# Patient Record
Sex: Male | Born: 1990 | Marital: Single | Smoking: Former smoker
Health system: Southern US, Community
[De-identification: ages and names within clinical notes are randomized; demographics above are authoritative.]

## PROBLEM LIST (undated history)

## (undated) DIAGNOSIS — M419 Scoliosis, unspecified: Secondary | ICD-10-CM

## (undated) HISTORY — DX: Scoliosis, unspecified: M41.9

---

## 2012-03-15 ENCOUNTER — Ambulatory Visit (INDEPENDENT_AMBULATORY_CARE_PROVIDER_SITE_OTHER): Payer: BC Managed Care – PPO | Admitting: Family Medicine

## 2012-03-15 VITALS — BP 111/70 | HR 60 | Temp 97.5°F | Resp 18 | Ht 68.0 in | Wt 158.0 lb

## 2012-03-15 DIAGNOSIS — Z113 Encounter for screening for infections with a predominantly sexual mode of transmission: Secondary | ICD-10-CM

## 2012-03-15 LAB — HIV ANTIBODY (ROUTINE TESTING W REFLEX): HIV: NONREACTIVE

## 2012-03-15 LAB — HEPATITIS C ANTIBODY: HCV Ab: NEGATIVE

## 2012-03-15 NOTE — Progress Notes (Signed)
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 He's here for STD screening. He spoke become sexually active and wants to make sure he didn't have any problems.  He has a history of HPV.  Objective: Patient has multiple verrucous papules at the base of his penis on the right. No other abnormalities are noted.  Assessment: Need for STD screening. I discussed with the patient the fact that he does have HPV and this is contagious. I offered him a referral to Dr. Jeannett Senior DOS the and noted on a prescription pad for. Plan:  Plan: Hepatitis C titer, HIV, RPR, UA probe. Urged to use condoms

## 2012-03-16 LAB — RPR

## 2012-03-18 LAB — GC/CHLAMYDIA PROBE AMP, URINE
Chlamydia, Swab/Urine, PCR: NEGATIVE
GC Probe Amp, Urine: NEGATIVE

## 2013-05-27 ENCOUNTER — Ambulatory Visit (INDEPENDENT_AMBULATORY_CARE_PROVIDER_SITE_OTHER): Payer: BC Managed Care – PPO | Admitting: Medical

## 2013-05-27 ENCOUNTER — Ambulatory Visit: Payer: BC Managed Care – PPO | Admitting: Medical

## 2013-05-27 ENCOUNTER — Encounter: Payer: Self-pay | Admitting: Medical

## 2013-05-27 VITALS — BP 130/80 | HR 72 | Temp 97.9°F | Resp 16 | Wt 204.0 lb

## 2013-05-27 DIAGNOSIS — R51 Headache: Secondary | ICD-10-CM

## 2013-05-27 DIAGNOSIS — B349 Viral infection, unspecified: Secondary | ICD-10-CM

## 2013-05-27 DIAGNOSIS — R110 Nausea: Secondary | ICD-10-CM

## 2013-05-27 DIAGNOSIS — Z23 Encounter for immunization: Secondary | ICD-10-CM

## 2013-05-27 DIAGNOSIS — B9789 Other viral agents as the cause of diseases classified elsewhere: Secondary | ICD-10-CM

## 2013-05-27 NOTE — Progress Notes (Signed)
Subjective:  Jackson Terrell is a 22 y.o. male who presents as a new patient today.   Here for 24 hour hx/o scratchy throat, nausea, feeling warm but no fever. Denies fever, vomiting, cough, diarrhea, ear pain, nasal congestion, SOB, abdominal pain.  Coworker was out earlier this week with 24 hour stomach bug with same symptoms.   No other aggravating or relieving factors.  No other c/o.  The following portions of the patient's history were reviewed and updated as appropriate: allergies, current medications, past family history, past medical history, past social history, past surgical history and problem list.  ROS Otherwise as in subjective above  Objective: Physical Exam  BP 130/80  Pulse 72  Temp(Src) 97.9 F (36.6 C) (Oral)  Resp 16  Wt 204 lb (92.534 kg)   General appearance: alert, no distress, WD/WN HEENT: normocephalic, sclerae anicteric, conjunctiva pink and moist, TMs pearly, nares patent, no discharge or erythema, pharynx with slight erythema Oral cavity: MMM, no lesions Neck: supple, no lymphadenopathy, no thyromegaly, no masses Heart: RRR, normal S1, S2, no murmurs Lungs: CTA bilaterally, no wheezes, rhonchi, or rales Abdomen: +bs, soft, non tender, non distended, no masses, no hepatomegaly, no splenomegaly Pulses: 2+ radial pulses, 2+ pedal pulses, normal cap refill Neuro: nonfocal Ext: no edema    Assessment: Encounter Diagnoses  Name Primary?  . Nausea alone Yes  . Headache(784.0)   . Viral syndrome   . Need for prophylactic vaccination and inoculation against influenza     Plan: Viral gastroenteritis vs GERD.  Begin OTC Emetrol or Benadryl for nausea, discussed GERD trigger avoidance, rest, hydrate well, and if not improving in 2-3 days, then call back.  Counseled on the influenza virus vaccine.  Vaccine information sheet given.  Influenza vaccine given after consent obtained.  Follow up: prn, consider physical

## 2013-05-27 NOTE — Patient Instructions (Signed)
Symptoms currently suggest either viral syndrome, stomach bug, or possibly even acid reflux.   Recommendations: For the next few days use a bland diet, clear liquids, rest, wash hands frequently.  You can use benadryl or Emetrol for nausea OTC.   If you need something stronger, then call back.  Call or return if fever, call if several episodes of diarrhea, call if uncontrollable vomiting.

## 2013-07-29 ENCOUNTER — Ambulatory Visit (INDEPENDENT_AMBULATORY_CARE_PROVIDER_SITE_OTHER): Payer: BC Managed Care – PPO | Admitting: Family Medicine

## 2013-07-29 ENCOUNTER — Ambulatory Visit: Payer: BC Managed Care – PPO | Admitting: Family Medicine

## 2013-07-29 ENCOUNTER — Encounter: Payer: Self-pay | Admitting: Family Medicine

## 2013-07-29 VITALS — BP 128/80 | HR 68 | Temp 98.1°F | Ht 69.0 in | Wt 209.0 lb

## 2013-07-29 DIAGNOSIS — J029 Acute pharyngitis, unspecified: Secondary | ICD-10-CM

## 2013-07-29 NOTE — Patient Instructions (Signed)
Use tylenol or ibuprofen as needed for pain.  Salt water gargles will help. If you develop more cold symptoms (nasal congestion, cough, etc), then use decongestant as needed, expectorant as needed (ie we discussed mucinex and sudafed, vs mucinex D)--I don't think you need these now.  Consider doing some core and back strengthening exercises (such as yoga, pilates) to help prevent back pain.  Schedule a complete physical at your convenience

## 2013-07-29 NOTE — Progress Notes (Signed)
Chief Complaint  Patient presents with  . sore throat    sore throat for the last 2 days. it was hurting when he woke up but then after he got out of shower it doesnt hurt as bad. he can just notice that he has one. pt is also clear throat.   1-2 days ago he started with scratchy throat.  It was worse yesterday, and this morning his glands felt swollen.  Feeling better now, still a little sore, but not nearly as bad as when he woke up this morning.  Denies runny nose, sniffles, sneezing, fevers, ear pain, sinus pain.  He has occasional cough.  Denies body aches.  He got a flu shot this year  Past Medical History  Diagnosis Date  . Scoliosis     mild   History reviewed. No pertinent past surgical history. History   Social History  . Marital Status: Single    Spouse Name: N/A    Number of Children: N/A  . Years of Education: N/A   Occupational History  . accounts payable (Therapist, sportsproperty management)    Social History Main Topics  . Smoking status: Former Smoker    Types: Cigarettes    Quit date: 07/17/2007  . Smokeless tobacco: Never Used  . Alcohol Use: Yes     Comment: 3 beers/week or less  . Drug Use: No  . Sexual Activity: Not on file   Other Topics Concern  . Not on file   Social History Narrative   Lives alone, no pets   Family History  Problem Relation Age of Onset  . Hemachromatosis Father   . Cancer Neg Hx   . Diabetes Neg Hx    No current outpatient prescriptions on file.  No Known Allergies  ROS: denies fevers, chest pain, headaches, dizziness, rashes, bleeding, bruising, nausea, vomiting, diarrhea, urinary complaints or other concerns. Occasional low back pain.  He has sedentary job; back felt better when working in [employer] and on his feet more.  PHYSICAL EXAM: BP 128/80  Pulse 68  Temp(Src) 98.1 F (36.7 C) (Oral)  Ht 5\' 9"  (1.753 m)  Wt 209 lb (94.802 kg)  BMI 30.85 kg/m2 Well developed, pleasant male in no distress HEENT: PERRL, EOMI, conjunctiva  clear. TM's and EAC's normal.  Nasal mucosa is mildly edematous with clear mucus on the left, and some crusting on the right.  Sinuses are nontender.  OP is clear--very mild erythema of anterior tonsillar pillars, and posteriorly, but tonsils are normal Neck: no lymphadenopathy or mass Heart: regular rate and rhythm without murmur Lungs: clear bilaterally Skin: no rash Neuro: alert and oriented Psych: normal mood, affect  ASSESSMENT/PLAN:  Acute pharyngitis  Pharyngitis--likely related to some postnasal drainage, worsened by mouth breathing at night from nasal congestion.  Could be a mild virus/cold.  Supportive measures reviewed.  F/u prn, physical recommended.  Intermittent LBP, possibly related to mild scoliosis.  Recommended back and core strengthening exercises (ie yoga, pilates).  Schedule CPE

## 2013-08-19 ENCOUNTER — Encounter: Payer: Self-pay | Admitting: Medical

## 2013-08-19 ENCOUNTER — Ambulatory Visit (INDEPENDENT_AMBULATORY_CARE_PROVIDER_SITE_OTHER): Payer: BC Managed Care – PPO | Admitting: Medical

## 2013-08-19 VITALS — BP 100/60 | HR 92 | Temp 98.6°F | Resp 16 | Wt 205.0 lb

## 2013-08-19 DIAGNOSIS — R110 Nausea: Secondary | ICD-10-CM

## 2013-08-19 DIAGNOSIS — R197 Diarrhea, unspecified: Secondary | ICD-10-CM

## 2013-08-19 DIAGNOSIS — A084 Viral intestinal infection, unspecified: Secondary | ICD-10-CM

## 2013-08-19 DIAGNOSIS — A088 Other specified intestinal infections: Secondary | ICD-10-CM

## 2013-08-19 NOTE — Progress Notes (Signed)
  Subjective:  Jackson Terrell is a 23 y.o. male who presents for illness.  Started Monday at 12:30am with nausea, numerous episodes of diarrhea, dry heaves, not feeling well.  All day Tuesday had several episodes of loose stool, nausea, ongoing belly upset. So far today only a few episodes of loose stool, some nausea, and just doesn't feel good.  Denies fever, vomiting, blood in stool, mucus in stool, no GU symptoms, no URI symptoms. No recent travel, no recent antibiotics, no recent camping, no animal contacts. One sick contact had a stomach bug several days ago. No other aggravating or relieving factors. No other ccomplaint The following portions of the patient's history were reviewed and updated as appropriate: allergies, current medications, past family history, past medical history, past social history, past surgical history and problem list.  ROS Otherwise as in subjective above  Objective: Physical Exam BP 100/60  Pulse 92  Temp(Src) 98.6 F (37 C) (Oral)  Resp 16  Wt 205 lb (92.987 kg)  General appearance: alert, no distress, WD/WN HEENT: normocephalic, sclerae anicteric, conjunctiva pink and moist, TMs pearly, nares patent, no discharge or erythema, pharynx normal Oral cavity: MMM, no lesions Neck: supple, no lymphadenopathy, no thyromegaly, no masses Heart: RRR, normal S1, S2, no murmurs Lungs: CTA bilaterally, no wheezes, rhonchi, or rales Abdomen: +mildly increased bs, soft, non tender, non distended, no masses, no hepatomegaly, no splenomegaly Pulses: 2+ radial pulses, 2+ pedal pulses, normal cap refill Ext: no edema   Assessment: Encounter Diagnoses  Name Primary?  . Viral gastroenteritis Yes  . Nausea alone   . Diarrhea     Plan: Currently he doesn't look particularly toxic or sick appearing.  Doesn't look particularly dehydrated, vitals are normal.  Discussed diagnosis of viral gastroenteritis, supportive  care, can use Pepto-Bismol over-the-counter. Recommended good hydration, limit solid food intake for now until much better.  He declines prescription anti-emetics at this time.  Advise that the normal time frame for this to resolve would be the next few days.  If any new symptoms such as fever, blood in stool, worsening loose stool particularly over 6 or 7 times daily then return. Note given for work.  Follow up: Otherwise when necessary

## 2013-08-19 NOTE — Patient Instructions (Signed)
Viral Gastroenteritis Viral gastroenteritis is also known as stomach flu. This condition affects the stomach and intestinal tract. The illness typically lasts 3 to 8 days. Most people develop an immune response. This eventually gets rid of the virus. While this natural response develops, the virus can make you quite ill.  CAUSES  Diarrhea and vomiting are often caused by a virus. Medicines (antibiotics) that kill germs will not help unless there is also a germ (bacterial) infection. SYMPTOMS  The most common symptom is diarrhea. This can cause severe loss of fluids (dehydration) and body salt (electrolyte) imbalance. TREATMENT  Treatments for this illness are aimed at rehydration. Antidiarrheal medicines are not recommended. They do not decrease diarrhea volume and may be harmful. Usually, home treatment is all that is needed. The most serious cases involve vomiting so severely that you are not able to keep down fluids taken by mouth (orally). In these cases, intravenous (IV) fluids are needed. Vomiting with viral gastroenteritis is common, but it will usually go away with treatment. HOME CARE INSTRUCTIONS  Small amounts of fluids should be taken frequently. Large amounts at one time may not be tolerated. Plain water may be harmful in infants and the elderly. Oral rehydration solutions (ORS) are available at pharmacies and grocery stores. ORS replace water and important electrolytes in proper proportions. Sports drinks are not as effective as ORS and may be harmful due to sugars worsening diarrhea.  As a general guideline for children, replace any new fluid losses from diarrhea or vomiting with ORS as follows:   If your child weighs 22 pounds or under (10 kg or less), give 60-120 mL (1/4 - 1/2 cup or 2 - 4 ounces) of ORS for each diarrheal stool or vomiting episode.   If your child weighs more than 22 pounds (more than 10 kgs), give 120-240 mL (1/2 - 1 cup or 4 - 8 ounces) of ORS for each diarrheal  stool or vomiting episode.   In a child with vomiting, it may be helpful to give the above ORS replacement in 5 mL (1 teaspoon) amounts every 5 minutes, then increase as tolerated.   While correcting for dehydration, children should eat normally. However, foods high in sugar should be avoided because this may worsen diarrhea. Large amounts of carbonated soft drinks, juice, gelatin desserts, and other highly sugared drinks should be avoided.   After correction of dehydration, other liquids that are appealing to the child may be added. Children should drink small amounts of fluids frequently and fluids should be increased as tolerated.   Adults should eat normally while drinking more fluids than usual. Drink small amounts of fluids frequently and increase as tolerated. Drink enough water and fluids to keep your urine clear or pale yellow. Broths, weak decaffeinated tea, lemon-lime soft drinks (allowed to go flat), and ORS replace fluids and electrolytes.   Avoid:   Carbonated drinks.   Juice.   Extremely hot or cold fluids.   Caffeine drinks.   Fatty, greasy foods.   Alcohol.   Tobacco.   Too much intake of anything at one time.   Gelatin desserts.   Probiotics are active cultures of beneficial bacteria. They may lessen the amount and number of diarrheal stools in adults. Probiotics can be found in yogurt with active cultures and in supplements.   Wash your hands well to avoid spreading bacteria and viruses.   Antidiarrheal medicines are not recommended for infants and children.   Only take over-the-counter or prescription medicines for   pain, discomfort, or fever as directed by your caregiver. Do not give aspirin to children.   For adults with dehydration, ask your caregiver if you should continue all prescribed and over-the-counter medicines.   If your caregiver has given you a follow-up appointment, it is very important to keep that appointment. Not keeping the appointment  could result in a lasting (chronic) or permanent injury and disability. If there is any problem keeping the appointment, you must call to reschedule.  SEEK IMMEDIATE MEDICAL CARE IF:   You are unable to keep fluids down.   There is no urine output in 6 to 8 hours or there is only a small amount of very dark urine.   You develop shortness of breath.   There is blood in the vomit (may look like coffee grounds) or stool.   Belly (abdominal) pain develops, increases, or localizes.   There is persistent vomiting or diarrhea.   You have a fever.   Your baby is older than 3 months with a rectal temperature of 102 F (38.9 C) or higher.   Your baby is 3 months old or younger with a rectal temperature of 100.4 F (38 C) or higher.  MAKE SURE YOU:   Understand these instructions.   Will watch your condition.   Will get help right away if you are not doing well or get worse.  Document Released: 07/02/2005 Document Revised: 03/14/2011 Document Reviewed: 11/13/2006 [employer] Patient Information 2012 [employer]. 

## 2013-11-10 ENCOUNTER — Encounter: Payer: Self-pay | Admitting: Medical

## 2013-11-10 ENCOUNTER — Other Ambulatory Visit: Payer: Self-pay | Admitting: Medical

## 2013-11-10 ENCOUNTER — Ambulatory Visit (INDEPENDENT_AMBULATORY_CARE_PROVIDER_SITE_OTHER): Payer: BC Managed Care – PPO | Admitting: Medical

## 2013-11-10 VITALS — BP 110/78 | HR 72 | Temp 98.1°F | Resp 14 | Wt 210.0 lb

## 2013-11-10 DIAGNOSIS — Z113 Encounter for screening for infections with a predominantly sexual mode of transmission: Secondary | ICD-10-CM

## 2013-11-10 DIAGNOSIS — R350 Frequency of micturition: Secondary | ICD-10-CM

## 2013-11-10 DIAGNOSIS — Z8349 Family history of other endocrine, nutritional and metabolic diseases: Secondary | ICD-10-CM

## 2013-11-10 DIAGNOSIS — A630 Anogenital (venereal) warts: Secondary | ICD-10-CM

## 2013-11-10 LAB — POCT URINALYSIS DIPSTICK
Bilirubin, UA: NEGATIVE
Blood, UA: NEGATIVE
GLUCOSE UA: NEGATIVE
Ketones, UA: NEGATIVE
NITRITE UA: NEGATIVE
Protein, UA: NEGATIVE
Spec Grav, UA: 1.01
UROBILINOGEN UA: NEGATIVE
pH, UA: 6

## 2013-11-10 LAB — BASIC METABOLIC PANEL
BUN: 11 mg/dL (ref 6–23)
CHLORIDE: 101 meq/L (ref 96–112)
CO2: 28 meq/L (ref 19–32)
Calcium: 9.9 mg/dL (ref 8.4–10.5)
Creat: 0.79 mg/dL (ref 0.50–1.35)
GLUCOSE: 94 mg/dL (ref 70–99)
POTASSIUM: 4.2 meq/L (ref 3.5–5.3)
SODIUM: 138 meq/L (ref 135–145)

## 2013-11-10 LAB — CBC WITH DIFFERENTIAL/PLATELET
BASOS ABS: 0.1 10*3/uL (ref 0.0–0.1)
BASOS PCT: 1 % (ref 0–1)
EOS ABS: 0.1 10*3/uL (ref 0.0–0.7)
EOS PCT: 1 % (ref 0–5)
HEMATOCRIT: 46.6 % (ref 39.0–52.0)
Hemoglobin: 16.1 g/dL (ref 13.0–17.0)
Lymphocytes Relative: 29 % (ref 12–46)
Lymphs Abs: 1.6 10*3/uL (ref 0.7–4.0)
MCH: 29.1 pg (ref 26.0–34.0)
MCHC: 34.5 g/dL (ref 30.0–36.0)
MCV: 84.3 fL (ref 78.0–100.0)
MONO ABS: 0.6 10*3/uL (ref 0.1–1.0)
Monocytes Relative: 10 % (ref 3–12)
Neutro Abs: 3.2 10*3/uL (ref 1.7–7.7)
Neutrophils Relative %: 59 % (ref 43–77)
PLATELETS: 248 10*3/uL (ref 150–400)
RBC: 5.53 MIL/uL (ref 4.22–5.81)
RDW: 14.5 % (ref 11.5–15.5)
WBC: 5.5 10*3/uL (ref 4.0–10.5)

## 2013-11-10 LAB — IBC PANEL
%SAT: 56 % — AB (ref 20–55)
TIBC: 351 ug/dL (ref 215–435)
UIBC: 155 ug/dL (ref 125–400)

## 2013-11-10 LAB — HEPATIC FUNCTION PANEL
ALBUMIN: 5 g/dL (ref 3.5–5.2)
ALT: 65 U/L — AB (ref 0–53)
AST: 35 U/L (ref 0–37)
Alkaline Phosphatase: 74 U/L (ref 39–117)
BILIRUBIN INDIRECT: 0.9 mg/dL (ref 0.2–1.2)
Bilirubin, Direct: 0.2 mg/dL (ref 0.0–0.3)
TOTAL PROTEIN: 7.6 g/dL (ref 6.0–8.3)
Total Bilirubin: 1.1 mg/dL (ref 0.2–1.2)

## 2013-11-10 LAB — IRON: Iron: 196 ug/dL — ABNORMAL HIGH (ref 42–165)

## 2013-11-10 MED ORDER — IMIQUIMOD 5 % EX CREA: TOPICAL_CREAM | CUTANEOUS | Status: DC

## 2013-11-10 NOTE — Addendum Note (Signed)
Addended by: Jac CanavanYSINGER, Darus Hershman S on: 11/10/2013 10:26 AM   Modules accepted: Orders

## 2013-11-10 NOTE — Progress Notes (Signed)
   Subjective:   Jackson Terrell is a 23 y.o. male presenting on 11/10/2013 with Urinary Frequency  Been having urinary frequency last several days.  Has scoliosis, but no current back pain.  Denies abdominal pain, fever, no specific polydipsia, no recent weight changes, denies urinary odor or cloudy, no burning with urination, but does sting sometimes.  No discharge from penis, no pelvic discomfort.  No rectal pain.   No change in amount of fluids recently.   Urinated 12 times yesterday, had urgency.  No prior UTI, no prior prostate infection.   Has hx/o HPV, but no other STD.  Currently 1 sexual partner x few months.   Last STD testing 1.5 years ago.   No scrotal pain or swelling, no rash.  Does drink considerable amount of caffeine.  Has cup of coffee, and 1 combo of red bull and Gatorade daily.  No other aggravating or relieving factors.   Wants to be screened for hemachromatosis.  Dad has hx/o this.    Has genital warts.  Saw doctor before who gave him a cream.   Used this with some relief.   No other complaint.  Review of Systems ROS as in subjective      Objective:    Filed Vitals:   11/10/13 0931  BP: 110/78  Pulse: 72  Temp: 98.1 F (36.7 C)  Resp: 14    General appearance: alert, no distress, WD/WN Neck: supple, no lymphadenopathy, no thyromegaly, no masses Heart: RRR, normal S1, S2, no murmurs Lungs: CTA bilaterally, no wheezes, rhonchi, or rales Abdomen: +bs, soft, non tender, non distended, no masses, no hepatomegaly, no splenomegaly Pulses: 2+ symmetric, upper and lower extremities, normal cap refill GU: normal male circumcised genitalia, small 2-3 mm diameter verrucal lesions on bilat penis shaft, otherwise no rash, no hernia, no other mass       Assessment: Encounter Diagnoses  Name Primary?  . Urinary frequency Yes  . Family history of hemochromatosis   . Genital warts   . Screen for STD (sexually transmitted disease)      Plan: Urinary  frequency - discussed possible causes. STD screening today, urinalysis, labs. This may be just caffeine related given his increasing caffeine use.  Family history of hemachromatosis-labs screening today  Genital warts-begin Aldara cream topically followup 1 month  STD screening today, discussed safe sex   Jackson Terrell was seen today for urinary frequency.  Diagnoses and associated orders for this visit:  Urinary frequency - Basic metabolic panel - CBC with Differential  Family history of hemochromatosis - CBC with Differential - Hepatic function panel - IBC panel  Genital warts - GC/Chlamydia Probe Amp - HIV antibody - RPR  Screen for STD (sexually transmitted disease) - GC/Chlamydia Probe Amp - HIV antibody - RPR  Other Orders - imiquimod (ALDARA) 5 % cream; Apply topically 3 (three) times a week.    Return pending labs.

## 2013-11-10 NOTE — Addendum Note (Signed)
Addended by: Janeice RobinsonSCALES, Raheim Beutler L on: 11/10/2013 10:22 AM   Modules accepted: Orders

## 2013-11-11 LAB — FERRITIN: Ferritin: 79 ng/mL (ref 22–322)

## 2013-11-11 LAB — RPR

## 2013-11-11 LAB — HIV ANTIBODY (ROUTINE TESTING W REFLEX): HIV 1&2 Ab, 4th Generation: NONREACTIVE

## 2013-11-11 LAB — GC/CHLAMYDIA PROBE AMP
CT Probe RNA: NEGATIVE
GC PROBE AMP APTIMA: NEGATIVE

## 2013-11-12 LAB — URINE CULTURE
COLONY COUNT: NO GROWTH
Organism ID, Bacteria: NO GROWTH

## 2015-10-18 DIAGNOSIS — F329 Major depressive disorder, single episode, unspecified: Secondary | ICD-10-CM | POA: Diagnosis not present

## 2015-10-18 DIAGNOSIS — K219 Gastro-esophageal reflux disease without esophagitis: Secondary | ICD-10-CM | POA: Diagnosis not present

## 2016-02-27 DIAGNOSIS — J029 Acute pharyngitis, unspecified: Secondary | ICD-10-CM | POA: Diagnosis not present

## 2016-03-13 DIAGNOSIS — R030 Elevated blood-pressure reading, without diagnosis of hypertension: Secondary | ICD-10-CM | POA: Diagnosis not present

## 2016-03-13 DIAGNOSIS — Z0000 Encounter for general adult medical examination without abnormal findings: Secondary | ICD-10-CM | POA: Diagnosis not present

## 2016-03-13 DIAGNOSIS — Z13220 Encounter for screening for lipoid disorders: Secondary | ICD-10-CM | POA: Diagnosis not present

## 2016-04-20 DIAGNOSIS — H5213 Myopia, bilateral: Secondary | ICD-10-CM | POA: Diagnosis not present

## 2016-06-17 DIAGNOSIS — R202 Paresthesia of skin: Secondary | ICD-10-CM | POA: Diagnosis not present

## 2016-06-17 DIAGNOSIS — G44209 Tension-type headache, unspecified, not intractable: Secondary | ICD-10-CM | POA: Diagnosis not present

## 2016-06-17 DIAGNOSIS — L739 Follicular disorder, unspecified: Secondary | ICD-10-CM | POA: Diagnosis not present

## 2016-06-19 DIAGNOSIS — R42 Dizziness and giddiness: Secondary | ICD-10-CM | POA: Diagnosis not present

## 2016-06-19 DIAGNOSIS — R51 Headache: Secondary | ICD-10-CM | POA: Diagnosis not present

## 2016-06-19 DIAGNOSIS — Z79899 Other long term (current) drug therapy: Secondary | ICD-10-CM | POA: Diagnosis not present

## 2016-08-21 DIAGNOSIS — L600 Ingrowing nail: Secondary | ICD-10-CM | POA: Diagnosis not present

## 2017-01-15 DIAGNOSIS — H109 Unspecified conjunctivitis: Secondary | ICD-10-CM | POA: Diagnosis not present

## 2017-01-15 DIAGNOSIS — R030 Elevated blood-pressure reading, without diagnosis of hypertension: Secondary | ICD-10-CM | POA: Diagnosis not present

## 2017-02-10 DIAGNOSIS — L739 Follicular disorder, unspecified: Secondary | ICD-10-CM | POA: Diagnosis not present

## 2017-02-10 DIAGNOSIS — R220 Localized swelling, mass and lump, head: Secondary | ICD-10-CM | POA: Diagnosis not present

## 2017-03-19 DIAGNOSIS — Z0000 Encounter for general adult medical examination without abnormal findings: Secondary | ICD-10-CM | POA: Diagnosis not present

## 2017-03-19 DIAGNOSIS — Z13220 Encounter for screening for lipoid disorders: Secondary | ICD-10-CM | POA: Diagnosis not present

## 2017-03-19 DIAGNOSIS — F39 Unspecified mood [affective] disorder: Secondary | ICD-10-CM | POA: Diagnosis not present

## 2017-06-03 DIAGNOSIS — H5213 Myopia, bilateral: Secondary | ICD-10-CM | POA: Diagnosis not present

## 2017-07-02 DIAGNOSIS — F324 Major depressive disorder, single episode, in partial remission: Secondary | ICD-10-CM | POA: Diagnosis not present

## 2017-07-02 DIAGNOSIS — Z23 Encounter for immunization: Secondary | ICD-10-CM | POA: Diagnosis not present

## 2017-08-13 DIAGNOSIS — F324 Major depressive disorder, single episode, in partial remission: Secondary | ICD-10-CM | POA: Diagnosis not present

## 2018-01-06 DIAGNOSIS — Z13220 Encounter for screening for lipoid disorders: Secondary | ICD-10-CM | POA: Diagnosis not present

## 2018-01-06 DIAGNOSIS — R7309 Other abnormal glucose: Secondary | ICD-10-CM | POA: Diagnosis not present

## 2018-01-06 DIAGNOSIS — Z6831 Body mass index (BMI) 31.0-31.9, adult: Secondary | ICD-10-CM | POA: Diagnosis not present

## 2018-01-06 DIAGNOSIS — Z0000 Encounter for general adult medical examination without abnormal findings: Secondary | ICD-10-CM | POA: Diagnosis not present

## 2018-02-07 DIAGNOSIS — Z79899 Other long term (current) drug therapy: Secondary | ICD-10-CM | POA: Diagnosis not present

## 2018-02-07 DIAGNOSIS — L600 Ingrowing nail: Secondary | ICD-10-CM | POA: Diagnosis not present

## 2018-05-14 DIAGNOSIS — G8929 Other chronic pain: Secondary | ICD-10-CM | POA: Diagnosis not present

## 2018-05-14 DIAGNOSIS — Z23 Encounter for immunization: Secondary | ICD-10-CM | POA: Diagnosis not present

## 2018-05-14 DIAGNOSIS — G44229 Chronic tension-type headache, not intractable: Secondary | ICD-10-CM | POA: Diagnosis not present

## 2018-05-14 DIAGNOSIS — M419 Scoliosis, unspecified: Secondary | ICD-10-CM | POA: Diagnosis not present

## 2018-05-14 DIAGNOSIS — M5137 Other intervertebral disc degeneration, lumbosacral region: Secondary | ICD-10-CM | POA: Diagnosis not present

## 2018-05-14 DIAGNOSIS — M545 Low back pain: Secondary | ICD-10-CM | POA: Diagnosis not present

## 2018-06-24 DIAGNOSIS — M5136 Other intervertebral disc degeneration, lumbar region: Secondary | ICD-10-CM | POA: Diagnosis not present

## 2018-06-24 DIAGNOSIS — M439 Deforming dorsopathy, unspecified: Secondary | ICD-10-CM | POA: Diagnosis not present

## 2018-06-24 DIAGNOSIS — M549 Dorsalgia, unspecified: Secondary | ICD-10-CM | POA: Diagnosis not present

## 2018-06-24 DIAGNOSIS — M545 Low back pain: Secondary | ICD-10-CM | POA: Diagnosis not present

## 2018-07-21 DIAGNOSIS — M545 Low back pain: Secondary | ICD-10-CM | POA: Diagnosis not present

## 2018-07-21 DIAGNOSIS — G8929 Other chronic pain: Secondary | ICD-10-CM | POA: Diagnosis not present

## 2018-07-21 DIAGNOSIS — F324 Major depressive disorder, single episode, in partial remission: Secondary | ICD-10-CM | POA: Diagnosis not present

## 2018-09-06 DIAGNOSIS — K120 Recurrent oral aphthae: Secondary | ICD-10-CM | POA: Diagnosis not present

## 2019-02-09 DIAGNOSIS — B029 Zoster without complications: Secondary | ICD-10-CM | POA: Diagnosis not present

## 2020-03-15 ENCOUNTER — Emergency Department (HOSPITAL_COMMUNITY): Payer: BC Managed Care – PPO

## 2020-03-15 ENCOUNTER — Observation Stay (HOSPITAL_COMMUNITY)
Admission: EM | Admit: 2020-03-15 | Discharge: 2020-03-19 | Disposition: A | Discharge status: Home / Self Care | Payer: BC Managed Care – PPO | Attending: Internal Medicine | Admitting: Internal Medicine

## 2020-03-15 DIAGNOSIS — R61 Generalized hyperhidrosis: Secondary | ICD-10-CM | POA: Diagnosis not present

## 2020-03-15 DIAGNOSIS — I6340 Cerebral infarction due to embolism of unspecified cerebral artery: Secondary | ICD-10-CM | POA: Diagnosis not present

## 2020-03-15 DIAGNOSIS — F329 Major depressive disorder, single episode, unspecified: Secondary | ICD-10-CM | POA: Diagnosis not present

## 2020-03-15 DIAGNOSIS — G9081 Serotonin syndrome: Secondary | ICD-10-CM

## 2020-03-15 DIAGNOSIS — Z87891 Personal history of nicotine dependence: Secondary | ICD-10-CM | POA: Insufficient documentation

## 2020-03-15 DIAGNOSIS — Z20822 Contact with and (suspected) exposure to covid-19: Secondary | ICD-10-CM | POA: Diagnosis not present

## 2020-03-15 DIAGNOSIS — F32A Depression, unspecified: Secondary | ICD-10-CM

## 2020-03-15 DIAGNOSIS — G2579 Other drug induced movement disorders: Secondary | ICD-10-CM

## 2020-03-15 DIAGNOSIS — T43225A Adverse effect of selective serotonin reuptake inhibitors, initial encounter: Secondary | ICD-10-CM | POA: Diagnosis not present

## 2020-03-15 DIAGNOSIS — T43221A Poisoning by selective serotonin reuptake inhibitors, accidental (unintentional), initial encounter: Secondary | ICD-10-CM | POA: Diagnosis not present

## 2020-03-15 LAB — CBC WITH DIFFERENTIAL/PLATELET
Abs Immature Granulocytes: 0.08 10*3/uL — ABNORMAL HIGH (ref 0.00–0.07)
Basophils Absolute: 0.1 10*3/uL (ref 0.0–0.1)
Basophils Relative: 1 %
Eosinophils Absolute: 0.1 10*3/uL (ref 0.0–0.5)
Eosinophils Relative: 1 %
HCT: 49.6 % (ref 39.0–52.0)
Hemoglobin: 16.3 g/dL (ref 13.0–17.0)
Immature Granulocytes: 1 %
Lymphocytes Relative: 17 %
Lymphs Abs: 2.3 10*3/uL (ref 0.7–4.0)
MCH: 28.7 pg (ref 26.0–34.0)
MCHC: 32.9 g/dL (ref 30.0–36.0)
MCV: 87.3 fL (ref 80.0–100.0)
Monocytes Absolute: 0.9 10*3/uL (ref 0.1–1.0)
Monocytes Relative: 7 %
Neutro Abs: 10 10*3/uL — ABNORMAL HIGH (ref 1.7–7.7)
Neutrophils Relative %: 73 %
Platelets: 270 10*3/uL (ref 150–400)
RBC: 5.68 MIL/uL (ref 4.22–5.81)
RDW: 13.2 % (ref 11.5–15.5)
WBC: 13.5 10*3/uL — ABNORMAL HIGH (ref 4.0–10.5)
nRBC: 0 % (ref 0.0–0.2)

## 2020-03-15 LAB — CK: Total CK: 152 U/L (ref 49–397)

## 2020-03-15 LAB — COMPREHENSIVE METABOLIC PANEL
ALT: 63 U/L — ABNORMAL HIGH (ref 0–44)
AST: 70 U/L — ABNORMAL HIGH (ref 15–41)
Albumin: 4.7 g/dL (ref 3.5–5.0)
Alkaline Phosphatase: 74 U/L (ref 38–126)
Anion gap: 14 (ref 5–15)
BUN: 9 mg/dL (ref 6–20)
CO2: 21 mmol/L — ABNORMAL LOW (ref 22–32)
Calcium: 9.4 mg/dL (ref 8.9–10.3)
Chloride: 106 mmol/L (ref 98–111)
Creatinine, Ser: 0.98 mg/dL (ref 0.61–1.24)
GFR calc Af Amer: >60 mL/min (ref >60)
GFR calc non Af Amer: >60 mL/min (ref >60)
Glucose, Bld: 112 mg/dL — ABNORMAL HIGH (ref 70–99)
Potassium: 4.9 mmol/L (ref 3.5–5.1)
Sodium: 141 mmol/L (ref 135–145)
Total Bilirubin: 1.5 mg/dL — ABNORMAL HIGH (ref 0.3–1.2)
Total Protein: 7.8 g/dL (ref 6.5–8.1)

## 2020-03-15 LAB — LIPASE, BLOOD: Lipase: 28 U/L (ref 11–51)

## 2020-03-15 LAB — T4, FREE: Free T4: 0.85 ng/dL (ref 0.61–1.12)

## 2020-03-15 LAB — TSH: TSH: 1.125 u[IU]/mL (ref 0.350–4.500)

## 2020-03-15 LAB — SARS CORONAVIRUS 2 BY RT PCR (HOSPITAL ORDER, PERFORMED IN ~~LOC~~ HOSPITAL LAB): SARS Coronavirus 2: NEGATIVE

## 2020-03-15 LAB — CBG MONITORING, ED: Glucose-Capillary: 113 mg/dL — ABNORMAL HIGH (ref 70–99)

## 2020-03-15 LAB — MAGNESIUM: Magnesium: 2.4 mg/dL (ref 1.7–2.4)

## 2020-03-15 IMAGING — DX DG CHEST 1V PORT
1 series · 1 of 1 positions shown · non-contrast
Comparison: None.

CLINICAL DATA: Diaphoresis altered mental status

EXAM:
PORTABLE CHEST 1 VIEW

[chest]
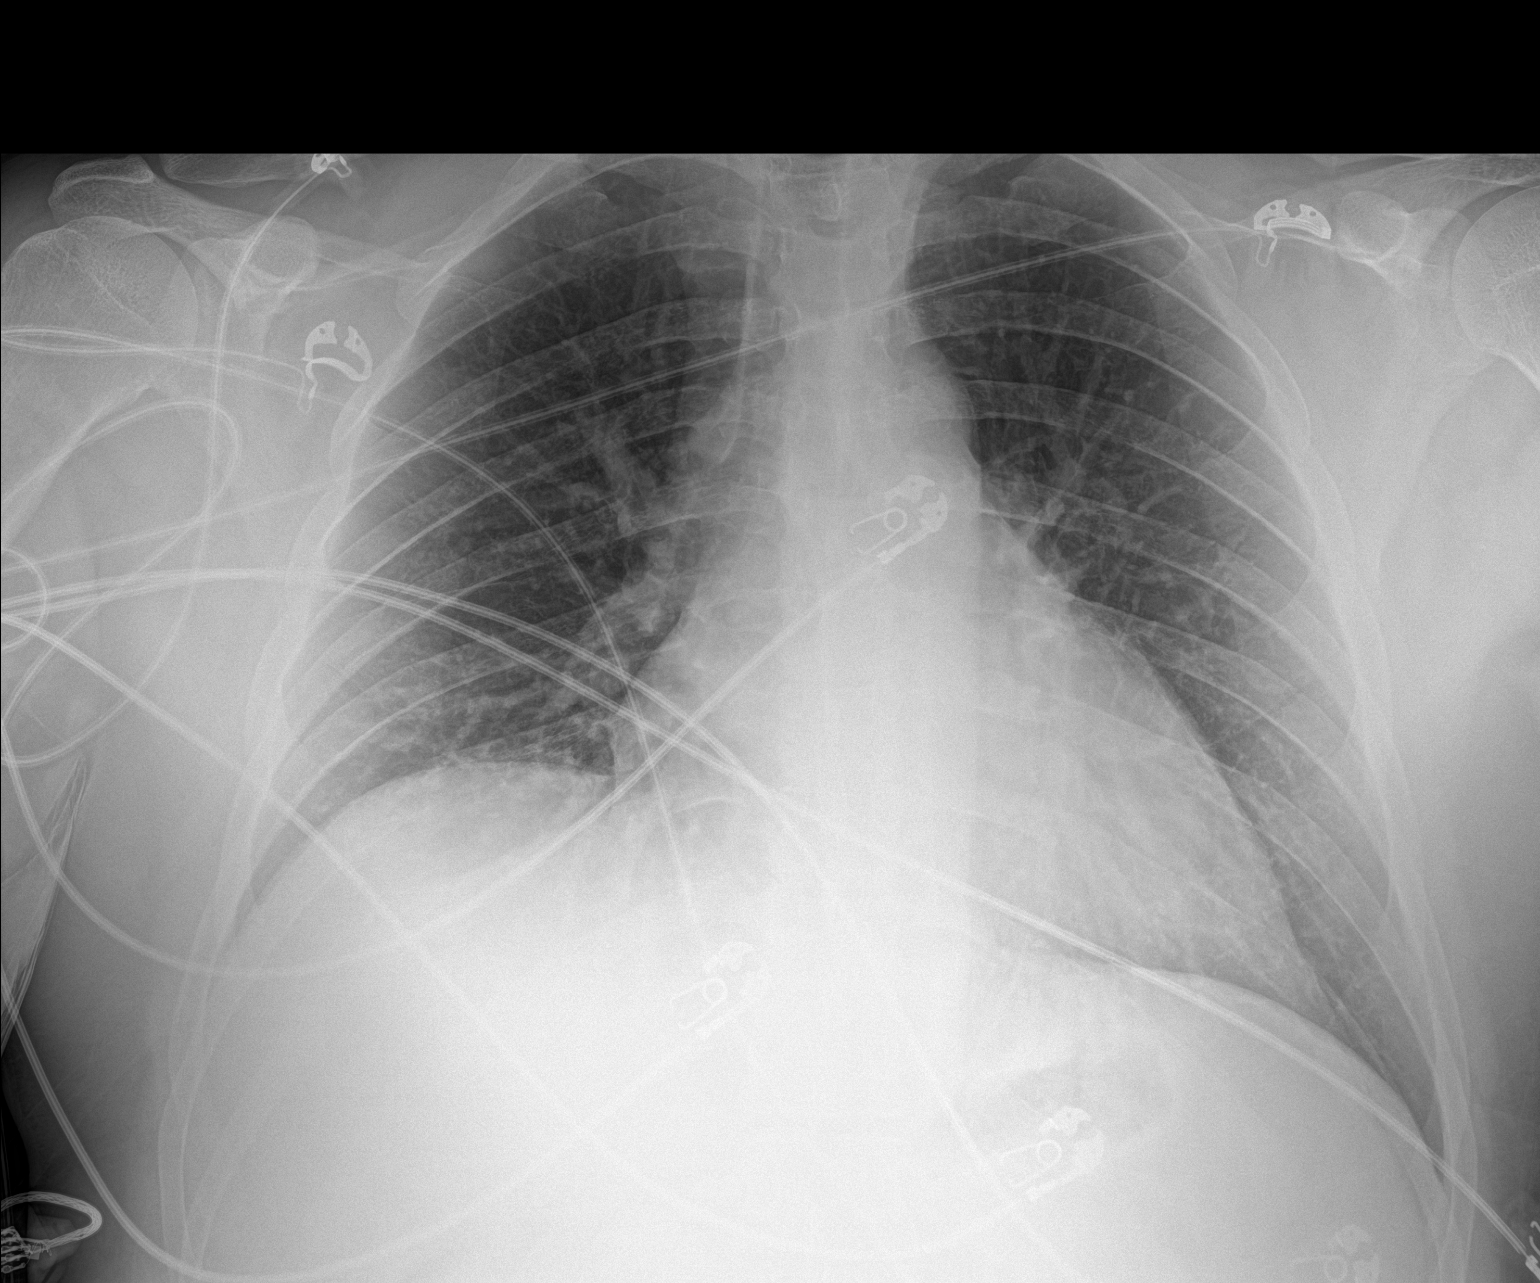

[1 of 1 positions shown; findings below may reference images not displayed]

FINDINGS: No focal opacity or pleural effusion. Cardiac size upper limits of
normal. No pneumothorax.
IMPRESSION: No active disease.

## 2020-03-15 MED ORDER — ONDANSETRON HCL 4 MG/2ML IJ SOLN
INTRAMUSCULAR | Status: AC
  Filled 2020-03-15: qty 2

## 2020-03-15 MED ORDER — ENOXAPARIN SODIUM 40 MG/0.4ML ~~LOC~~ SOLN
40.0000 mg | SUBCUTANEOUS | Status: DC
  Administered 2020-03-16 – 2020-03-18 (×4): 40 mg via SUBCUTANEOUS
  Filled 2020-03-15 (×4): qty 0.4

## 2020-03-15 MED ORDER — LORAZEPAM 2 MG/ML IJ SOLN
1.0000 mg | Freq: Four times a day (QID) | INTRAMUSCULAR | Status: DC | PRN
  Administered 2020-03-15 – 2020-03-19 (×4): 1 mg via INTRAVENOUS
  Filled 2020-03-15 (×4): qty 1

## 2020-03-15 MED ORDER — SODIUM CHLORIDE 0.9 % IV SOLN: INTRAVENOUS | Status: DC

## 2020-03-15 MED ORDER — SODIUM CHLORIDE 0.9 % IV BOLUS
1000.0000 mL | Freq: Once | INTRAVENOUS | Status: AC
  Administered 2020-03-15: 1000 mL via INTRAVENOUS

## 2020-03-15 MED ORDER — LORAZEPAM 2 MG/ML IJ SOLN
1.0000 mg | Freq: Once | INTRAMUSCULAR | Status: AC
  Administered 2020-03-15: 1 mg via INTRAVENOUS
  Filled 2020-03-15: qty 1

## 2020-03-15 NOTE — ED Triage Notes (Signed)
Pt arrived via [facility] from work after pt got very dizzy at work and had a diaphoretic episode. EMS states water was poored on pt due to pt felt hot. Pt arrived diaphoretic to ER. Pt was alert with n/v when arriving to ED.

## 2020-03-15 NOTE — ED Provider Notes (Signed)
[facility] EMERGENCY DEPARTMENT Provider Note   CSN: [id number] Arrival date & time: 03/15/20  1614     History Chief Complaint  Patient presents with  . Diphoretic    Jackson Terrell is a 29 y.o. male.  The history is provided by the patient and medical records. No language interpreter was used.  Illness Location:  Fatigue, diaphoresis Severity:  Severe Onset quality:  Gradual Duration:  1 hour Timing:  Constant Progression:  Unchanged Chronicity:  New Associated symptoms: fatigue, nausea and vomiting   Associated symptoms: no abdominal pain, no chest pain, no congestion, no cough, no diarrhea, no fever, no headaches, no loss of consciousness, no rash, no rhinorrhea, no shortness of breath and no wheezing        Past Medical History:  Diagnosis Date  . Scoliosis    mild    There are no problems to display for this patient.   No past surgical history on file.     Family History  Problem Relation Age of Onset  . Hemachromatosis Father   . Cancer Neg Hx   . Diabetes Neg Hx     Social History   Tobacco Use  . Smoking status: Former Smoker    Types: Cigarettes    Quit date: 07/17/2007    Years since quitting: 12.6  . Smokeless tobacco: Never Used  Substance Use Topics  . Alcohol use: Yes    Comment: 3 beers/week or less  . Drug use: No    Home Medications Prior to Admission medications   Medication Sig Start Date End Date Taking? Authorizing Provider  imiquimod (ALDARA) 5 % cream Apply topically 3 (three) times a week. 11/10/13   Tysinger, Kermit Balo, PA-C    Allergies    Patient has no known allergies.  Review of Systems   Review of Systems  Constitutional: Positive for diaphoresis and fatigue. Negative for chills and fever.  HENT: Negative for congestion and rhinorrhea.   Eyes: Negative for visual disturbance.  Respiratory: Negative for cough, chest tightness, shortness of breath and wheezing.   Cardiovascular: Negative  for chest pain, palpitations and leg swelling.  Gastrointestinal: Positive for nausea and vomiting. Negative for abdominal pain and diarrhea.  Genitourinary: Negative for dysuria and flank pain.  Musculoskeletal: Negative for back pain, neck pain and neck stiffness.  Skin: Negative for rash and wound.  Neurological: Positive for dizziness, tremors and light-headedness. Negative for loss of consciousness, weakness and headaches.  Psychiatric/Behavioral: Negative for agitation and confusion.  All other systems reviewed and are negative.   Physical Exam Updated Vital Signs BP (!) 174/109   Pulse 98   Temp 98 F (36.7 C) (Rectal)   Resp 15   Ht 5\' 8"  (1.727 m)   Wt 90.7 kg   SpO2 98%   BMI 30.41 kg/m   Physical Exam Vitals and nursing note reviewed.  Constitutional:      General: He is not in acute distress.    Appearance: He is well-developed. He is ill-appearing, toxic-appearing and diaphoretic.  HENT:     Head: Normocephalic and atraumatic.     Nose: No congestion or rhinorrhea.     Mouth/Throat:     Mouth: Mucous membranes are moist.     Pharynx: No oropharyngeal exudate or posterior oropharyngeal erythema.  Eyes:     Extraocular Movements:     Right eye: Abnormal extraocular motion present.     Left eye: Abnormal extraocular motion present.     Conjunctiva/sclera: Conjunctivae  normal.     Pupils: Pupils are equal.     Comments: Patient has bilateral horizontal nystagmus on my initial exam.  Pupils are also dilated.  Cardiovascular:     Rate and Rhythm: Regular rhythm. Tachycardia present.     Pulses: Normal pulses.     Heart sounds: No murmur heard.   Pulmonary:     Effort: Pulmonary effort is normal. No respiratory distress.     Breath sounds: Normal breath sounds. No wheezing, rhonchi or rales.  Chest:     Chest wall: No tenderness.  Abdominal:     Palpations: Abdomen is soft.     Tenderness: There is no abdominal tenderness. There is no right CVA tenderness,  left CVA tenderness, guarding or rebound.  Musculoskeletal:        General: No tenderness.     Cervical back: Neck supple. No tenderness.     Right lower leg: No edema.     Left lower leg: No edema.  Skin:    General: Skin is warm.  Neurological:     Mental Status: He is alert.     Cranial Nerves: No dysarthria or facial asymmetry.     Sensory: No sensory deficit.     Motor: Tremor and abnormal muscle tone present. No seizure activity.     Deep Tendon Reflexes: Reflexes abnormal.     Comments: Clonus in both legs that is sustained.  Hoffmann's positive in both hands.  Hyper tonic in all extremities.  Symmetric grip strength and leg strength.  Tremor present.     ED Results / Procedures / Treatments   Labs (all labs ordered are listed, but only abnormal results are displayed) Labs Reviewed  CBC WITH DIFFERENTIAL/PLATELET - Abnormal; Notable for the following components:      Result Value   WBC 13.5 (*)    Neutro Abs 10.0 (*)    Abs Immature Granulocytes 0.08 (*)    All other components within normal limits  COMPREHENSIVE METABOLIC PANEL - Abnormal; Notable for the following components:   CO2 21 (*)    Glucose, Bld 112 (*)    AST 70 (*)    ALT 63 (*)    Total Bilirubin 1.5 (*)    All other components within normal limits  CBG MONITORING, ED - Abnormal; Notable for the following components:   Glucose-Capillary 113 (*)    All other components within normal limits  SARS CORONAVIRUS 2 BY RT PCR (HOSPITAL ORDER, PERFORMED IN [facility])  URINE CULTURE  LIPASE, BLOOD  MAGNESIUM  TSH  T4, FREE  CK  T3, FREE  RAPID URINE DRUG SCREEN, HOSP PERFORMED  URINALYSIS, ROUTINE W REFLEX MICROSCOPIC  HIV ANTIBODY (ROUTINE TESTING W REFLEX)  BASIC METABOLIC PANEL  CBC    EKG EKG Interpretation  Date/Time:  Tuesday March 15 2020 16:29:40 EDT Ventricular Rate:  94 PR Interval:    QRS Duration: 99 QT Interval:  377 QTC Calculation: 472 R Axis:   69 Text  Interpretation: Sinus rhythm Probable left atrial enlargement RSR' in V1 or V2, right VCD or RVH ST elev, probable normal early repol pattern Borderline prolonged QT interval no prior eCG for comparison. No STEMI Confirmed by Theda Belfastegeler, Chris ([id number]) on 03/15/2020 4:31:58 PM   Radiology DG Chest Portable 1 View  Result Date: 03/15/2020 CLINICAL DATA:  Diaphoresis altered mental status EXAM: PORTABLE CHEST 1 VIEW COMPARISON:  None. FINDINGS: No focal opacity or pleural effusion. Cardiac size upper limits of normal. No pneumothorax. IMPRESSION:  No active disease. Electronically Signed   By: Jasmine Pang M.D.   On: 03/15/2020 17:11    Procedures Procedures (including critical care time)  CRITICAL CARE Performed by: Canary Brim Natalee Tomkiewicz Total critical care time: 35  minutes Critical care time was exclusive of separately billable procedures and treating other patients. Critical care was necessary to treat or prevent imminent or life-threatening deterioration. Critical care was time spent personally by me on the following activities: development of treatment plan with patient and/or surrogate as well as nursing, discussions with consultants, evaluation of patient's response to treatment, examination of patient, obtaining history from patient or surrogate, ordering and performing treatments and interventions, ordering and review of laboratory studies, ordering and review of radiographic studies, pulse oximetry and re-evaluation of patient's condition.    Medications Ordered in ED Medications  sodium chloride 0.9 % bolus 1,000 mL (0 mLs Intravenous Stopped 03/15/20 1726)  LORazepam (ATIVAN) injection 1 mg (1 mg Intravenous Given 03/15/20 1657)    ED Course  I have reviewed the triage vital signs and the nursing notes.  Pertinent labs & imaging results that were available during my care of the patient were reviewed by me and considered in my medical decision making (see chart for details).      MDM Rules/Calculators/A&P                          Jackson Terrell is a 29 y.o. male with a past medical history significant for scoliosis and is currently on sertraline who presents with lightheadedness, diaphoresis, fatigue, and nausea/vomiting.  According to patient, after lunch today, he got very sweaty, felt very hot, and felt like he might pass out.  Coworkers poured water all over him when he felt extremely hot to the touch.  He started having nausea and vomiting and EMS transported him to the emergency department.  On arrival, patient is slightly tachycardic and was afebrile on rectal temp.  He is not hypoxic or hypotensive.  He was actually hypertensive with blood pressure in the 170s on arrival.  On exam, he is extremely diaphoretic.  Patient did have somewhat dilated pupils and his ocular exam had some bilateral nystagmus.  His lungs were clear and chest was nontender.  Abdomen is nontender.  He was actively vomiting.  He had tremors in both legs and we checked clonus he had sustained clonus in both legs.  He also had clonus with patellar reflex assessment.  He is hyperreflexive in all extremities and hypertonic.  He was alert and oriented.  No evidence of trauma.  Patient was given Zofran on arrival with a nausea and vomiting before we had a more clear idea of what was likely the cause of his symptoms.  After discussion with the patient, he does report NSAID taking 200 mg of sertraline he is been taking 300 mg for the last year because it was not working well.  Clinically I am concerned he is having acute serotonin syndrome causing his constellation of findings and symptoms.  I called neurology and they came to the patient and agreed this does appear to be serotonin syndrome.  They agreed with the dose of Ativan we gave the patient and admission to medicine for further monitoring management.  We got screening labs initially a CT head was ordered due to the dizziness he reported however  neurology recommended canceling the CT.  After Ativan, his clonus appears to be starting to improve and his ocular  abnormalities have also improved.  His heart rate is now not tachycardic after fluids initiated.  Patient will have screening labs and will be admitted for further management of serotonin syndrome.   Final Clinical Impression(s) / ED Diagnoses Final diagnoses:  Serotonin syndrome     Clinical Impression: 1. Serotonin syndrome     Disposition: Admit  This note was prepared with assistance of Dragon voice recognition software. Occasional wrong-word or sound-a-like substitutions may have occurred due to the inherent limitations of voice recognition software.      Kriston Mckinnie, Canary Brim, MD 03/15/20 2028

## 2020-03-15 NOTE — H&P (Signed)
History and Physical    RENNER SEBALD YQI:[id number] DOB: [birth year] DOA: 03/15/2020  PCP: [facility]  Patient coming from: Home  I have personally briefly reviewed patient's old medical records in [facility]  Chief Complaint: nausea, vomiting and dizziness  HPI: Jackson Terrell is a 29 y.o. male with medical history significant for depression who presents with concerns of nausea, vomiting and dizziness.  Patient had lunch today in the afternoon and about an hour afterwards began to note sudden acute onset of nausea, dizziness and persistent vomiting.  Denies any diarrhea abdominal pain.  Has been feeling cold and having chills.  He denies any chest pain or palpitations.  No shortness of breath.  No headaches. Reports that he has been taking his sertraline at 300 mg for the past year since he did not feel like the lower dose was working.  Denies any suicidal ideation but that he is just "stressed." Denies any tobacco use.  Has occasional alcohol use.  States he has not used marijuana in over a year.  ED Course: He was diaphoretic, mildly tachycardic and hypertensive up to 170s.  He was noted to have sustained hyperreflexia of the patella reflex.  Neurology was consulted by ED physician for concerns of serotonin syndrome.  Neurology has evaluated bedside and recommends as needed Ativan and hospitalist admission for serotonin syndrome.  Review of Systems:  Constitutional: No Weight Change, No Fever ENT/Mouth: No sore throat, No Rhinorrhea Eyes: No Eye Pain, No Vision Changes Cardiovascular: No Chest Pain, no SOB, No Palpitations Respiratory: No Cough, No Sputum, No Wheezing, no Dyspnea  Gastrointestinal: + Nausea, + Vomiting, No Diarrhea, No Constipation, No Pain Genitourinary: no Urinary Incontinence, No Urgency, No Flank Pain Musculoskeletal: No Arthralgias, No Myalgias Skin: No Skin Lesions, No Pruritus, Neuro: no Weakness, No  Numbness,  No Loss of Consciousness, No Syncope Psych: No Anxiety/Panic, No Depression, no decrease appetite Heme/Lymph: No Bruising, No Bleeding  Past Medical History:  Diagnosis Date  . Scoliosis    mild    No past surgical history on file.   reports that he quit smoking about 12 years ago. His smoking use included cigarettes. He has never used smokeless tobacco. He reports current alcohol use. He reports that he does not use drugs. Social History  No Known Allergies  Family History  Problem Relation Age of Onset  . Hemachromatosis Father   . Cancer Neg Hx   . Diabetes Neg Hx      Prior to Admission medications   Medication Sig Start Date End Date Taking? Authorizing Provider  sertraline (ZOLOFT) 100 MG tablet Take 300 mg by mouth daily. 01/31/20  Yes [provider]  imiquimod (ALDARA) 5 % cream Apply topically 3 (three) times a week. Patient not taking: Reported on 03/15/2020 11/10/13   Jac Canavan, PA-C    Physical Exam: Vitals:   03/15/20 1632 03/15/20 1800 03/15/20 1830 03/15/20 1900  BP:  136/86 (!) 141/101 (!) 145/98  Pulse:  89 88 89  Resp:  20 16 (!) 22  Temp: 98 F (36.7 C)  97.9 F (36.6 C)   TempSrc: Rectal  Oral   SpO2:  94% 95% 94%  Weight:      Height:        Constitutional: NAD, calm, comfortable, ill-appearing diaphoretic male laying at 30 degree incline in bed and holding emesis bag. Vitals:   03/15/20 1632 03/15/20 1800 03/15/20 1830 03/15/20 1900  BP:  136/86 Marland Kitchen)  141/101 (!) 145/98  Pulse:  89 88 89  Resp:  20 16 (!) 22  Temp: 98 F (36.7 C)  97.9 F (36.6 C)   TempSrc: Rectal  Oral   SpO2:  94% 95% 94%  Weight:      Height:       Eyes: PERRL, lids and conjunctivae normal ENMT: Mucous membranes are moist.  Neck: normal, supple Respiratory: clear to auscultation bilaterally, no wheezing, no crackles. Normal respiratory effort. No accessory muscle use.  Cardiovascular: Regular rate and rhythm, no murmurs / rubs /  gallops. No extremity edema.   Abdomen: no tenderness, no masses palpated.  Bowel sounds positive.  Musculoskeletal: no clubbing / cyanosis. No joint deformity upper and lower extremities. Good ROM, no contractures. Normal muscle tone.  Skin: no rashes, lesions, ulcers. No induration Neurologic: CN 2-12 grossly intact. Sensation intact,+4 hyperreflexia of bilateral patellar reflex, strength 5/5 in all 4.  Psychiatric: Normal judgment and insight. Alert and oriented x 3. Normal mood.     Labs on Admission: I have personally reviewed following labs and imaging studies  CBC: Recent Labs  Lab 03/15/20 1700  WBC 13.5*  NEUTROABS 10.0*  HGB 16.3  HCT 49.6  MCV 87.3  PLT 270   Basic Metabolic Panel: Recent Labs  Lab 03/15/20 1700  NA 141  K 4.9  CL 106  CO2 21*  GLUCOSE 112*  BUN 9  CREATININE 0.98  CALCIUM 9.4  MG 2.4   GFR: Estimated Creatinine Clearance: 120.5 mL/min (by C-G formula based on SCr of 0.98 mg/dL). Liver Function Tests: Recent Labs  Lab 03/15/20 1700  AST 70*  ALT 63*  ALKPHOS 74  BILITOT 1.5*  PROT 7.8  ALBUMIN 4.7   Recent Labs  Lab 03/15/20 1700  LIPASE 28   No results for input(s): AMMONIA in the last 168 hours. Coagulation Profile: No results for input(s): INR, PROTIME in the last 168 hours. Cardiac Enzymes: Recent Labs  Lab 03/15/20 1700  CKTOTAL 152   BNP (last 3 results) No results for input(s): PROBNP in the last 8760 hours. HbA1C: No results for input(s): HGBA1C in the last 72 hours. CBG: Recent Labs  Lab 03/15/20 1628  GLUCAP 113*   Lipid Profile: No results for input(s): CHOL, HDL, LDLCALC, TRIG, CHOLHDL, LDLDIRECT in the last 72 hours. Thyroid Function Tests: Recent Labs    03/15/20 1700  TSH 1.125  FREET4 0.85   Anemia Panel: No results for input(s): VITAMINB12, FOLATE, FERRITIN, TIBC, IRON, RETICCTPCT in the last 72 hours. Urine analysis:    Component Value Date/Time   BILIRUBINUR neg 11/10/2013 1021    PROTEINUR neg 11/10/2013 1021   UROBILINOGEN negative 11/10/2013 1021   NITRITE neg 11/10/2013 1021   LEUKOCYTESUR Trace 11/10/2013 1021    Radiological Exams on Admission: DG Chest Portable 1 View  Result Date: 03/15/2020 CLINICAL DATA:  Diaphoresis altered mental status EXAM: PORTABLE CHEST 1 VIEW COMPARISON:  None. FINDINGS: No focal opacity or pleural effusion. Cardiac size upper limits of normal. No pneumothorax. IMPRESSION: No active disease. Electronically Signed   By: Jasmine Pang M.D.   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  Denies suicidal ideation. Hold sertraline Avoid all serotonergic medication- Do NOT use Zofran for nausea as this further antagonizes serotonin uptake PRN 1mg  ativan q6hr as needed for any hyperthermia (will not be relieved with Tylenol), hypertension (systolic greater  than 150), tachycardia (HR>100), agitation. Esmolol could also be consider for severe hypertension and tachycardia if not relieved with Ativan  Continuous IV fluid Continuous telemetry  Keep O2 saturation > 94% Neurology has been consulted and has evaluated Check UDS  Depression consider psychiatry consult  DVT prophylaxis:.Lovenox Code Status: Full Family Communication: Plan discussed with patient at bedside  disposition Plan: Home with at least 2 midnight stays  Consults called:  Admission status: inpatient      Kairah Leoni T [place] DO [facility]   If 7PM-7AM, please contact night-coverage [email or web address]   03/15/2020, 8:25 PM

## 2020-03-15 NOTE — ED Notes (Signed)
Jackson Terrell mother [phone] would like an update on the pt

## 2020-03-15 NOTE — Consult Note (Addendum)
NEUROLOGY CONSULTATION NOTE   Date of service: March 15, 2020 Patient Name: Jackson Terrell MRN:  [id number] DOB:  [birth year] Reason for consult: "hyperreflexia"  History of Present Illness  Jackson Terrell is a 29 y.o. male with PMH significant for scoliosis, sertraline who presents with diaphoresis, nausea/vomiting. He was noted to have dilated pupils, hyperreflexia and increased tone in the ED. Given his presentation, neurology was consulted to assess for potential serotonin syndrome.  On enquiring, patient does endorse taking 300mg  of sertraline a day despite being prescribed 200mg  daily. He feels that 200mg  daily is not working for him. He has been on higher dose for the last year. He denies any new medications recently. Denies using any recreational substances.   ROS   Constitutional Denies weight loss, fever and chills.  HEENT Denies changes in vision and hearing.  Respiratory Denies SOB and cough.  CV Denies palpitations and CP  GI Denies abdominal pain, + nausea, vomiting.  GU Denies dysuria and urinary frequency.  MSK Denies myalgia and joint pain.  Skin Denies rash and pruritus.  Neurological Denies headache and syncope.  Psychiatric Denies recent changes in mood. Denies anxiety and depression.   Past History   Past Medical History:  Diagnosis Date  . Scoliosis    mild   No past surgical history on file. Family History  Problem Relation Age of Onset  . Hemachromatosis Father   . Cancer Neg Hx   . Diabetes Neg Hx    Social History   Socioeconomic History  . Marital status: Single    Spouse name: Not on file  . Number of children: Not on file  . Years of education: Not on file  . Highest education level: Not on file  Occupational History  . Occupation: accounts payable ( )    Employer: [employer]  Tobacco Use  . Smoking status: Former Smoker    Types: Cigarettes    Quit date: 07/17/2007    Years since quitting: 12.6  .  Smokeless tobacco: Never Used  Substance and Sexual Activity  . Alcohol use: Yes    Comment: 3 beers/week or less  . Drug use: No  . Sexual activity: Not on file  Other Topics Concern  . Not on file  Social History Narrative   Lives alone, no pets   Social Determinants of Health   Financial Resource Strain:   . Difficulty of Paying Living Expenses: Not on file  Food Insecurity:   . Worried About in the Last Year: Not on file  . Ran Out of Food in the Last Year: Not on file  Transportation Needs:   . Lack of Transportation (Medical): Not on file  . Lack of Transportation (Non-Medical): Not on file  Physical Activity:   . Days of Exercise per Week: Not on file  . Minutes of Exercise per Session: Not on file  Stress:   . Feeling of Stress : Not on file  Social Connections:   . Frequency of Communication with Friends and Family: Not on file  . Frequency of Social Gatherings with Friends and Family: Not on file  . Attends Religious Services: Not on file  . Active Member of Clubs or Organizations: Not on file  . Attends Therapist, sports Meetings: Not on file  . Marital Status: Not on file   No Known Allergies  Medications  (Not in a hospital admission)    Vitals  Temp:  [98 F (36.7 C)] 98  F (36.7 C) (08/31 1632) Pulse Rate:  [98] 98 (08/31 1625) Resp:  [15] 15 (08/31 1625) BP: (174)/(109) 174/109 (08/31 1625) SpO2:  [98 %] 98 % (08/31 1625) Weight:  [90.7 kg] 90.7 kg (08/31 1627)  Body mass index is 30.41 kg/m.  Physical Exam   General: Laying comfortably in bed; in no acute distress.  HENT: Normal oropharynx and mucosa. Normal external appearance of ears and nose. Neck: Supple, no pain or tenderness CV: No JVD. No peripheral edema. Pulmonary: Symmetric Chest rise. Normal respiratory effort. Abdomen: Soft to touch, non-tender Ext: No cyanosis, edema, or deformity  Skin: No rash. Normal palpation of skin.   Musculoskeletal: Normal  digits and nails by inspection. No clubbing.  Neurologic Examination  Mental status/Cognition: Alert, oriented to self, place, month and year, good attention. Speech/language: Fluent, comprehension intact, object naming intact, repetition intact. Cranial nerves:   CN II BL dilated pupils equal and reactive to light, no VF deficits   CN III,IV,VI EOM intact, no gaze preference or deviation, no nystagmus   CN V normal sensation in V1, V2, and V3 segments bilaterally   CN VII no asymmetry, no nasolabial fold flattening   CN VIII normal hearing to speech   CN IX & X normal palatal elevation, no uvular deviation   CN XI 5/5 head turn and 5/5 shoulder shrug bilaterally   CN XII midline tongue protrusion   Motor:  Muscle bulk: normal, tone increased, pronator drift none Mvmt Root Nerve  Muscle Right Left Comments  SA C5/6 Ax Deltoid 5 5   EF C5/6 Mc Biceps 5 5   EE C6/7/8 Rad Triceps 5 5   WF C6/7 Med FCR 5 5   WE C7/8 PIN ECU 5 5   F Ab C8/T1 U ADM/FDI 5 5   HF L1/2/3 Fem Illopsoas 5 5   KE L2/3/4 Fem Quad 5 5   DF L4/5 D Peron Tib Ant 5 5   PF S1/2 Tibial Grc/Sol 5 5    Reflexes:  Right Left Comments  Pectoralis      Biceps (C5/6) 3 3   Brachioradialis (C5/6) 3 3    Triceps (C6/7) 3 3    Patellar (L3/4) 4 4 patellar clonus BL   Achilles (S1) 4 4 5-6 beats of clonus BL   Hoffman + +    Plantar up up   Jaw jerk    Sensation:  Light touch Intact throughout   Pin prick    Temperature    Vibration   Proprioception    Coordination/Complex Motor:  - Finger to Nose intact BL with tremors but no ataxia. - Heel to shin intact BL - Rapid alternating movement intact. - Gait: deferred.  Labs   Lab Results  Component Value Date   NA 138 11/10/2013   K 4.2 11/10/2013   CL 101 11/10/2013   CO2 28 11/10/2013   GLUCOSE 94 11/10/2013   BUN 11 11/10/2013   CREATININE 0.79 11/10/2013   CALCIUM 9.9 11/10/2013   ALBUMIN 5.0 11/10/2013   AST 35 11/10/2013   ALT 65 (H)  11/10/2013   ALKPHOS 74 11/10/2013   BILITOT 1.1 11/10/2013     Imaging and Diagnostic studies  None  Impression   Jackson Terrell is a 29 y.o. male with PMH significant for scoliosis, sertraline who presents with diaphoresis, nausea/vomiting. Has been taking more sertraline than prescribed. He does have classic signs of serotonergic toxicity with diaphoresis, hyperreflexia, increased tone, dilated pupils BL that he  reports started today. Symptoms improved after a dose of ativan in the ED.  Recommendations  - Hold sertraline, avoid serotonergic medications. - Recommend 1mg  Ativan Q6H as needed for serotonin syndrome with goal to normalize his vitals. - Will need to discuss with psychiatry later about management of his depression. - Recommend urine tox screen. ______________________________________________________________________   Thank you for the opportunity to take part in the care of this patient. If you have any further questions, please contact the neurology consultation attending.  Signed,  [facility] Pager Number Erick Blinks

## 2020-03-16 ENCOUNTER — Other Ambulatory Visit: Payer: Self-pay

## 2020-03-16 DIAGNOSIS — F329 Major depressive disorder, single episode, unspecified: Secondary | ICD-10-CM | POA: Diagnosis not present

## 2020-03-16 LAB — T3, FREE: T3, Free: 2.9 pg/mL (ref 2.0–4.4)

## 2020-03-16 LAB — BASIC METABOLIC PANEL
Anion gap: 10 (ref 5–15)
BUN: 5 mg/dL — ABNORMAL LOW (ref 6–20)
CO2: 24 mmol/L (ref 22–32)
Calcium: 9.1 mg/dL (ref 8.9–10.3)
Chloride: 106 mmol/L (ref 98–111)
Creatinine, Ser: 0.83 mg/dL (ref 0.61–1.24)
GFR calc Af Amer: >60 mL/min (ref >60)
GFR calc non Af Amer: >60 mL/min (ref >60)
Glucose, Bld: 110 mg/dL — ABNORMAL HIGH (ref 70–99)
Potassium: 3.8 mmol/L (ref 3.5–5.1)
Sodium: 140 mmol/L (ref 135–145)

## 2020-03-16 LAB — HIV ANTIBODY (ROUTINE TESTING W REFLEX): HIV Screen 4th Generation wRfx: NONREACTIVE

## 2020-03-16 LAB — CBC
HCT: 44.7 % (ref 39.0–52.0)
Hemoglobin: 14.3 g/dL (ref 13.0–17.0)
MCH: 28.4 pg (ref 26.0–34.0)
MCHC: 32 g/dL (ref 30.0–36.0)
MCV: 88.7 fL (ref 80.0–100.0)
Platelets: 246 10*3/uL (ref 150–400)
RBC: 5.04 MIL/uL (ref 4.22–5.81)
RDW: 13.4 % (ref 11.5–15.5)
WBC: 9.2 10*3/uL (ref 4.0–10.5)
nRBC: 0 % (ref 0.0–0.2)

## 2020-03-16 LAB — URINE CULTURE: Culture: NO GROWTH

## 2020-03-16 LAB — RAPID URINE DRUG SCREEN, HOSP PERFORMED
Amphetamines: NOT DETECTED
Barbiturates: NOT DETECTED
Benzodiazepines: POSITIVE — AB
Cocaine: NOT DETECTED
Opiates: NOT DETECTED
Tetrahydrocannabinol: NOT DETECTED

## 2020-03-16 LAB — URINALYSIS, ROUTINE W REFLEX MICROSCOPIC
Bilirubin Urine: NEGATIVE
Glucose, UA: NEGATIVE mg/dL
Hgb urine dipstick: NEGATIVE
Ketones, ur: NEGATIVE mg/dL
Leukocytes,Ua: NEGATIVE
Nitrite: NEGATIVE
Protein, ur: NEGATIVE mg/dL
Specific Gravity, Urine: 1.015 (ref 1.005–1.030)
pH: 7 (ref 5.0–8.0)

## 2020-03-16 MED ORDER — KETOROLAC TROMETHAMINE 30 MG/ML IJ SOLN
30.0000 mg | Freq: Once | INTRAMUSCULAR | Status: AC
  Administered 2020-03-16: 30 mg via INTRAVENOUS
  Filled 2020-03-16: qty 1

## 2020-03-16 MED ORDER — LORAZEPAM 2 MG/ML IJ SOLN
1.0000 mg | Freq: Once | INTRAMUSCULAR | Status: AC
  Administered 2020-03-16: 1 mg via INTRAVENOUS
  Filled 2020-03-16: qty 1

## 2020-03-16 MED ORDER — DIPHENHYDRAMINE HCL 50 MG/ML IJ SOLN
25.0000 mg | Freq: Once | INTRAMUSCULAR | Status: AC
  Administered 2020-03-16: 25 mg via INTRAVENOUS
  Filled 2020-03-16: qty 1

## 2020-03-16 NOTE — ED Notes (Signed)
Attempted report 

## 2020-03-16 NOTE — Discharge Instructions (Signed)
An outpatient psychiatric appointment was made on your behalf. The appointment is this Friday, September 3rd at 11am. The appointment will take place at [facility] at [address], [place], [place] [address]. The phone number is [phone].

## 2020-03-16 NOTE — ED Notes (Signed)
Calling SWOT

## 2020-03-16 NOTE — Progress Notes (Signed)
PROGRESS NOTE  Jackson Terrell EHM:[id number] DOB: 21-Dec-1990 DOA: 03/15/2020 PCP: [facility]  HPI/Recap of past 24 hours: HPI from Dr Ileene Rubens is a 29 y.o. male with medical history significant for depression who presents with concerns of nausea, vomiting and dizziness. Denies any diarrhea abdominal pain. Reports that he has been taking his sertraline at 300 mg for the past year since he did not feel like the lower dose was working. Denies any suicidal ideation but that he is just "stressed." Denies any tobacco use. Has occasional alcohol use.  States he has not used marijuana in over a year. ED Course: He was diaphoretic, mildly tachycardic and hypertensive up to 170s.  He was noted to have sustained hyperreflexia of the patella reflex.  Neurology was consulted by ED physician for concerns of serotonin syndrome. Neurology has evaluated bedside and recommends as needed Ativan and hospitalist admission for serotonin syndrome.    Today, patient still complains of nausea, but denies any further vomiting, still complains of generalized fatigue.  Still mildly tachycardic and hypertensive.  Denies any new complaints.   Assessment/Plan:  Principal Problem:   Serotonin syndrome Active Problems:   Depression  Serotonin syndrome Likely from SSRI overmedication (takes 300 mg of sertraline daily) Hold sertraline, avoid all teratogenic medication, avoid Zofran Continue as needed Ativan for any hyperthermia (will not be relieved with Tylenol), hypertension SBP greater than 150, tachycardia heart rate greater than 100, agitation Esmolol could be considered if not relieved with Ativan UDS unremarkable Neurology on board, appreciate recs Continue IV fluids Continue telemetry  Depression Psychiatry consulted, recommend outpatient follow-up and holding sertraline If not seen in the outpatient, may resume sertraline at 100 mg daily on  03/21/2020 Transition of care consulted for outpatient psychiatry follow-up      Malnutrition Type:      Malnutrition Characteristics:      Nutrition Interventions:       Estimated body mass index is 30.41 kg/m as calculated from the following:   Height as of this encounter: 5\' 8"  (1.727 m).   Weight as of this encounter: 90.7 kg.     Code Status: Full  Family Communication: Discussed with patient  Disposition Plan: Likely home      Consultants:  Neurology  Procedures:  None  Antimicrobials:  None  DVT prophylaxis: Lovenox   Objective: Vitals:   03/16/20 1000 03/16/20 1100 03/16/20 1300 03/16/20 1400  BP: (!) 155/110 (!) 151/102 (!) 153/101 (!) 169/109  Pulse: 98 (!) 110 (!) 101 91  Resp: 16 16  12   Temp:      TempSrc:      SpO2: 91% 91% 92% 92%  Weight:      Height:        Intake/Output Summary (Last 24 hours) at 03/16/2020 1425 Last data filed at 03/15/2020 1726 Gross per 24 hour  Intake 975 ml  Output --  Net 975 ml   Filed Weights   03/15/20 1627  Weight: 90.7 kg    Exam:  General: NAD, sleepy but easily arousable  Cardiovascular: S1, S2 present  Respiratory: CTAB  Abdomen: Soft, nontender, nondistended, bowel sounds present  Musculoskeletal: No bilateral pedal edema noted  Skin: Normal  Psychiatry: Normal mood  Neurology: No obvious focal neurologic deficits noted    Data Reviewed: CBC: Recent Labs  Lab 03/15/20 1700 03/16/20 0509  WBC 13.5* 9.2  NEUTROABS 10.0*  --   HGB 16.3 14.3  HCT 49.6 44.7  MCV 87.3 88.7  PLT 270 246   Basic Metabolic Panel: Recent Labs  Lab 03/15/20 1700 03/16/20 0509  NA 141 140  K 4.9 3.8  CL 106 106  CO2 21* 24  GLUCOSE 112* 110*  BUN 9 5*  CREATININE 0.98 0.83  CALCIUM 9.4 9.1  MG 2.4  --    GFR: Estimated Creatinine Clearance: 142.3 mL/min (by C-G formula based on SCr of 0.83 mg/dL). Liver Function Tests: Recent Labs  Lab 03/15/20 1700  AST 70*  ALT 63*   ALKPHOS 74  BILITOT 1.5*  PROT 7.8  ALBUMIN 4.7   Recent Labs  Lab 03/15/20 1700  LIPASE 28   No results for input(s): AMMONIA in the last 168 hours. Coagulation Profile: No results for input(s): INR, PROTIME in the last 168 hours. Cardiac Enzymes: Recent Labs  Lab 03/15/20 1700  CKTOTAL 152   BNP (last 3 results) No results for input(s): PROBNP in the last 8760 hours. HbA1C: No results for input(s): HGBA1C in the last 72 hours. CBG: Recent Labs  Lab 03/15/20 1628  GLUCAP 113*   Lipid Profile: No results for input(s): CHOL, HDL, LDLCALC, TRIG, CHOLHDL, LDLDIRECT in the last 72 hours. Thyroid Function Tests: Recent Labs    03/15/20 1700  TSH 1.125  FREET4 0.85  T3FREE 2.9   Anemia Panel: No results for input(s): VITAMINB12, FOLATE, FERRITIN, TIBC, IRON, RETICCTPCT in the last 72 hours. Urine analysis:    Component Value Date/Time   COLORURINE YELLOW 03/16/2020 0156   APPEARANCEUR HAZY (A) 03/16/2020 0156   LABSPEC 1.015 03/16/2020 0156   PHURINE 7.0 03/16/2020 0156   GLUCOSEU NEGATIVE 03/16/2020 0156   HGBUR NEGATIVE 03/16/2020 0156   BILIRUBINUR NEGATIVE 03/16/2020 0156   BILIRUBINUR neg 11/10/2013 1021   KETONESUR NEGATIVE 03/16/2020 0156   PROTEINUR NEGATIVE 03/16/2020 0156   UROBILINOGEN negative 11/10/2013 1021   NITRITE NEGATIVE 03/16/2020 0156   LEUKOCYTESUR NEGATIVE 03/16/2020 0156   Sepsis Labs: @LABRCNTIP (procalcitonin:4,lacticidven:4)  ) Recent Results (from the past 240 hour(s))  SARS Coronavirus 2 by RT PCR (hospital order, performed in [facility] hospital lab) Nasopharyngeal Nasopharyngeal Swab     Status: None   Collection Time: 03/15/20  5:20 PM   Specimen: Nasopharyngeal Swab  Result Value Ref Range Status   SARS Coronavirus 2 NEGATIVE NEGATIVE Final    Comment: (NOTE) SARS-CoV-2 target nucleic acids are NOT DETECTED.  The SARS-CoV-2 RNA is generally detectable in upper and lower respiratory specimens during the acute phase of  infection. The lowest concentration of SARS-CoV-2 viral copies this assay can detect is 250 copies / mL. A negative result does not preclude SARS-CoV-2 infection and should not be used as the sole basis for treatment or other patient management decisions.  A negative result may occur with improper specimen collection / handling, submission of specimen other than nasopharyngeal swab, presence of viral mutation(s) within the areas targeted by this assay, and inadequate number of viral copies (<250 copies / mL). A negative result must be combined with clinical observations, patient history, and epidemiological information.  Fact Sheet for Patients:   03/17/20  Fact Sheet for Healthcare Providers: BoilerBrush.com.cy  This test is not yet approved or  cleared by the https://pope.com/ FDA and has been authorized for detection and/or diagnosis of SARS-CoV-2 by FDA under an Emergency Use Authorization (EUA).  This EUA will remain in effect (meaning this test can be used) for the duration of the COVID-19 declaration under Section 564(b)(1) of the Act, 21 U.S.C. section 360bbb-3(b)(1),  unless the authorization is terminated or revoked sooner.  Performed at [facility], [address] [place], [place] [address]       Studies: DG Chest Portable 1 View  Result Date: 03/15/2020 CLINICAL DATA:  Diaphoresis altered mental status EXAM: PORTABLE CHEST 1 VIEW COMPARISON:  None. FINDINGS: No focal opacity or pleural effusion. Cardiac size upper limits of normal. No pneumothorax. IMPRESSION: No active disease. Electronically Signed   By: Jasmine Pang M.D.   On: 03/15/2020 17:11    Scheduled Meds: . enoxaparin (LOVENOX) injection  40 mg Subcutaneous Q24H    Continuous Infusions: . sodium chloride 75 mL/hr at 03/15/20 2200     LOS: 1 day     Briant Cedar, MD [facility]  If 7PM-7AM, please contact  night-coverage [email or web address] 03/16/2020, 2:25 PM

## 2020-03-16 NOTE — Plan of Care (Signed)
  Problem: Education: Goal: Knowledge of General Education information will improve Description Including pain rating scale, medication(s)/side effects and non-pharmacologic comfort measures Outcome: Progressing   Problem: Health Behavior/Discharge Planning: Goal: Ability to manage health-related needs will improve Outcome: Progressing   

## 2020-03-16 NOTE — Progress Notes (Signed)
CSW obtained patient an outpatient psychiatric appointment at [facility] on Friday 03/18/2020 at 11am.  CSW added appointment information to the patient's AVS.  Edwin Dada, MSW, LCSW-A Transitions of Care   Clinical Social Worker  [facility] Emergency Departments   Medical ICU [phone]

## 2020-03-16 NOTE — Progress Notes (Signed)
NEUROLOGY CONSULTATION PROGRESs NOTE   Date of service: March 16, 2020 Patient Name: Jackson Terrell MRN:  [id number] DOB:  07-10-1991  Subjective  Diaphoresis improved, feeling tired, tone is improved. Is not feeling agitated.  Past History   Past Medical History:  Diagnosis Date  . Scoliosis    mild   No past surgical history on file. Family History  Problem Relation Age of Onset  . Hemachromatosis Father   . Cancer Neg Hx   . Diabetes Neg Hx    Social History   Socioeconomic History  . Marital status: Single    Spouse name: Not on file  . Number of children: Not on file  . Years of education: Not on file  . Highest education level: Not on file  Occupational History  . Occupation: accounts payable (Therapist, sports)    Employer: [employer]  Tobacco Use  . Smoking status: Former Smoker    Types: Cigarettes    Quit date: 07/17/2007    Years since quitting: 12.6  . Smokeless tobacco: Never Used  Substance and Sexual Activity  . Alcohol use: Yes    Comment: 3 beers/week or less  . Drug use: No  . Sexual activity: Not on file  Other Topics Concern  . Not on file  Social History Narrative   Lives alone, no pets   Social Determinants of Health   Financial Resource Strain:   . Difficulty of Paying Living Expenses: Not on file  Food Insecurity:   . Worried About Programme researcher, broadcasting/film/video in the Last Year: Not on file  . Ran Out of Food in the Last Year: Not on file  Transportation Needs:   . Lack of Transportation (Medical): Not on file  . Lack of Transportation (Non-Medical): Not on file  Physical Activity:   . Days of Exercise per Week: Not on file  . Minutes of Exercise per Session: Not on file  Stress:   . Feeling of Stress : Not on file  Social Connections:   . Frequency of Communication with Friends and Family: Not on file  . Frequency of Social Gatherings with Friends and Family: Not on file  . Attends Religious Services: Not on file  .  Active Member of Clubs or Organizations: Not on file  . Attends Banker Meetings: Not on file  . Marital Status: Not on file   No Known Allergies  Medications  (Not in a hospital admission)    Vitals  Temp:  [97.9 F (36.6 C)-98 F (36.7 C)] 97.9 F (36.6 C) (08/31 1830) Pulse Rate:  [88-123] 100 (09/01 0100) Resp:  [15-35] 19 (09/01 0100) BP: (126-174)/(86-113) 126/92 (09/01 0100) SpO2:  [92 %-98 %] 93 % (09/01 0100) Weight:  [90.7 kg] 90.7 kg (08/31 1627)  Body mass index is 30.41 kg/m.  Physical Exam   General: Laying comfortably in bed; in no acute distress.  HENT: Normal oropharynx and mucosa. Normal external appearance of ears and nose. Neck: Supple, no pain or tenderness CV: No JVD. No peripheral edema. Pulmonary: Symmetric Chest rise. Normal respiratory effort. Abdomen: Soft to touch, non-tender Ext: No cyanosis, edema, or deformity  Skin: No rash. Normal palpation of skin.   Musculoskeletal: Normal digits and nails by inspection. No clubbing.  Neurologic Examination  Mental status/Cognition: Somnolent but awakens from sleep. Oriented to self, place, month and year, good attention. Speech/language: Fluent, comprehension intact, object naming intact, repetition intact. Cranial nerves:   CN II Pupils equal and reactive to  light, no VF deficits. No pupillary dilation noted today.   CN III,IV,VI EOM intact, no gaze preference or deviation, no nystagmus   CN V normal sensation in V1, V2, and V3 segments bilaterally   CN VII no asymmetry, no nasolabial fold flattening   CN VIII normal hearing to speech   CN IX & X normal palatal elevation, no uvular deviation   CN XI 5/5 head turn and 5/5 shoulder shrug bilaterally   CN XII midline tongue protrusion   Motor:  Muscle bulk: normal, tone improved from yesterday Mvmt Root Nerve  Muscle Right Left Comments  SA C5/6 Ax Deltoid 5 5   EF C5/6 Mc Biceps 5 5   EE C6/7/8 Rad Triceps 5 5   WF C6/7 Med FCR 5 5    WE C7/8 PIN ECU 5 5   F Ab C8/T1 U ADM/FDI 5 5   HF L1/2/3 Fem Illopsoas 5 5   KE L2/3/4 Fem Quad 5 5   DF L4/5 D Peron Tib Ant 5 5   PF S1/2 Tibial Grc/Sol 5 5    Reflexes:  Right Left Comments  Pectoralis      Biceps (C5/6) 2+ 2+   Brachioradialis (C5/6) 2+ 2+    Triceps (C6/7) 2+ 2+    Patellar (L3/4) 3 3    Achilles (S1) 4 4 2-3 beats of clonus BL   Hoffman      Plantar     Jaw jerk    Sensation:  Light touch intact   Pin prick    Temperature    Vibration   Proprioception    Coordination/Complex Motor:  - Finger to Nose with tremors on R - Heel to shin intact BL. - Gait: deferred.  Labs   Lab Results  Component Value Date   NA 140 03/16/2020   K 3.8 03/16/2020   CL 106 03/16/2020   CO2 24 03/16/2020   GLUCOSE 110 (H) 03/16/2020   BUN 5 (L) 03/16/2020   CREATININE 0.83 03/16/2020   CALCIUM 9.1 03/16/2020   ALBUMIN 4.7 03/15/2020   AST 70 (H) 03/15/2020   ALT 63 (H) 03/15/2020   ALKPHOS 74 03/15/2020   BILITOT 1.5 (H) 03/15/2020   GFRNONAA >60 03/16/2020   GFRAA >60 03/16/2020     Imaging and Diagnostic studies   None.  Impression   Jackson Terrell is a 29 y.o. male with PMH significant for scoliosis, sertraline who presents with diaphoresis, nausea/vomiting. Has been taking more sertraline than prescribed. He does have classic signs of serotonergic toxicity with diaphoresis, hyperreflexia, increased tone, dilated pupils BL that he reports started today. Symptoms improved after a dose of ativan in the ED.  Recommendations  - continue Ativan 1mg  Q6H PRN. Titrate to normalizing his vitals. - Tox screen negative. ______________________________________________________________________   Thank you for the opportunity to take part in the care of this patient. If you have any further questions, please contact the neurology consultation attending.  Signed,  [facility] Pager Number Erick Blinks

## 2020-03-16 NOTE — Progress Notes (Signed)
NEW ADMISSION NOTE New Admission Note:   Arrival Method: via stretcher from ED accompanied by SWOT RN Mental Orientation: Alert and oriented x4 Telemetry: box 5; NSR. Assessment: Completed Skin: warm, dry and intact. IV: left hand with NS at 72ml/hr. Pain: 8/10 Tubes: none Safety Measures: Safety Fall Prevention Plan has been given, discussed and signed Admission: in progress 5 [facility] Orientation: Patient has been orientated to the room, unit and staff.  Family: none at bedside  Orders have been reviewed and implemented. Will continue to monitor the patient. Call light has been placed within reach.   Margretta Sidle, RN

## 2020-03-16 NOTE — Consult Note (Signed)
  Patient is awaiting admission to the hospital for serotonin syndrome. Per chart review patient has been over medicating with sertraline at 300mg  for the past year.  Decrease to management or discontinuing the sertraline, providing supportive care by stabilizing vital signs in this case tachycardia, given oxygen with a goal oxygenation greater than 93% administering IV fluids which are currently infusing at 75 mL's per hour,, providing cardiac monitoring and sedating benzodiazepines which is already been ordered.  Patient appears to have mild symptoms of serotonin syndrome (as he presented with diaphoresis, tachycardia, hypertension, and hyperreflexia at the patellar reflex).  Patient can continue be admitted under observation for 24 hours after serotonergic agent has been discontinued.  There are no acute concerns regarding rapid cessation or withdrawal symptoms when discontinuing serotonergic agent with sertraline.  The likelihood of reexacerbation of depression or anxiety symptoms remain low, and recommend reducing Zoloft to 100 mg and 5 days.  Patient will need to follow-up with outpatient psychiatry this week for management of depression.  If patient is unable to be seen by psychiatry may resume Zoloft at 100 mg p.o. daily.  -We will need to have social work schedule an appointment with outpatient psychiatry preferably this week. -Continue observation status and current medical management as noted above. -May resume Zoloft 100 mg p.o. daily on Monday, September 6 if he is unable to be seen by psychiatry. -Psychiatry will sign off at this time.

## 2020-03-17 ENCOUNTER — Encounter (HOSPITAL_COMMUNITY): Payer: Self-pay | Admitting: Internal Medicine

## 2020-03-17 ENCOUNTER — Observation Stay (HOSPITAL_COMMUNITY): Payer: BC Managed Care – PPO

## 2020-03-17 ENCOUNTER — Inpatient Hospital Stay (HOSPITAL_COMMUNITY): Payer: BC Managed Care – PPO

## 2020-03-17 DIAGNOSIS — E669 Obesity, unspecified: Secondary | ICD-10-CM | POA: Diagnosis present

## 2020-03-17 DIAGNOSIS — I10 Essential (primary) hypertension: Secondary | ICD-10-CM | POA: Diagnosis present

## 2020-03-17 DIAGNOSIS — Y929 Unspecified place or not applicable: Secondary | ICD-10-CM | POA: Diagnosis not present

## 2020-03-17 DIAGNOSIS — E785 Hyperlipidemia, unspecified: Secondary | ICD-10-CM | POA: Diagnosis present

## 2020-03-17 DIAGNOSIS — R292 Abnormal reflex: Secondary | ICD-10-CM | POA: Diagnosis present

## 2020-03-17 DIAGNOSIS — I63441 Cerebral infarction due to embolism of right cerebellar artery: Secondary | ICD-10-CM | POA: Diagnosis not present

## 2020-03-17 DIAGNOSIS — Z7289 Other problems related to lifestyle: Secondary | ICD-10-CM | POA: Diagnosis not present

## 2020-03-17 DIAGNOSIS — Z6830 Body mass index (BMI) 30.0-30.9, adult: Secondary | ICD-10-CM | POA: Diagnosis not present

## 2020-03-17 DIAGNOSIS — T43221A Poisoning by selective serotonin reuptake inhibitors, accidental (unintentional), initial encounter: Secondary | ICD-10-CM | POA: Diagnosis present

## 2020-03-17 DIAGNOSIS — R339 Retention of urine, unspecified: Secondary | ICD-10-CM | POA: Diagnosis not present

## 2020-03-17 DIAGNOSIS — I6310 Cerebral infarction due to embolism of unspecified precerebral artery: Secondary | ICD-10-CM

## 2020-03-17 DIAGNOSIS — I6340 Cerebral infarction due to embolism of unspecified cerebral artery: Secondary | ICD-10-CM | POA: Insufficient documentation

## 2020-03-17 DIAGNOSIS — I6389 Other cerebral infarction: Secondary | ICD-10-CM | POA: Diagnosis not present

## 2020-03-17 DIAGNOSIS — Z79899 Other long term (current) drug therapy: Secondary | ICD-10-CM | POA: Diagnosis not present

## 2020-03-17 DIAGNOSIS — F329 Major depressive disorder, single episode, unspecified: Secondary | ICD-10-CM | POA: Diagnosis not present

## 2020-03-17 DIAGNOSIS — E119 Type 2 diabetes mellitus without complications: Secondary | ICD-10-CM | POA: Diagnosis present

## 2020-03-17 DIAGNOSIS — G2579 Other drug induced movement disorders: Secondary | ICD-10-CM | POA: Diagnosis not present

## 2020-03-17 DIAGNOSIS — I639 Cerebral infarction, unspecified: Secondary | ICD-10-CM | POA: Diagnosis not present

## 2020-03-17 DIAGNOSIS — Z87891 Personal history of nicotine dependence: Secondary | ICD-10-CM | POA: Diagnosis not present

## 2020-03-17 DIAGNOSIS — R29700 NIHSS score 0: Secondary | ICD-10-CM | POA: Diagnosis not present

## 2020-03-17 DIAGNOSIS — Z20822 Contact with and (suspected) exposure to covid-19: Secondary | ICD-10-CM | POA: Diagnosis present

## 2020-03-17 LAB — LIPID PANEL
Cholesterol: 157 mg/dL (ref 0–200)
HDL: 44 mg/dL (ref >40)
LDL Cholesterol: 96 mg/dL (ref 0–99)
Total CHOL/HDL Ratio: 3.6 RATIO
Triglycerides: 85 mg/dL (ref <150)
VLDL: 17 mg/dL (ref 0–40)

## 2020-03-17 LAB — SEDIMENTATION RATE: Sed Rate: 9 mm/hr (ref 0–16)

## 2020-03-17 LAB — CBC WITH DIFFERENTIAL/PLATELET
Abs Immature Granulocytes: 0.02 10*3/uL (ref 0.00–0.07)
Basophils Absolute: 0.1 10*3/uL (ref 0.0–0.1)
Basophils Relative: 1 %
Eosinophils Absolute: 0.1 10*3/uL (ref 0.0–0.5)
Eosinophils Relative: 1 %
HCT: 42.5 % (ref 39.0–52.0)
Hemoglobin: 13.8 g/dL (ref 13.0–17.0)
Immature Granulocytes: 0 %
Lymphocytes Relative: 23 %
Lymphs Abs: 1.9 10*3/uL (ref 0.7–4.0)
MCH: 28.3 pg (ref 26.0–34.0)
MCHC: 32.5 g/dL (ref 30.0–36.0)
MCV: 87.3 fL (ref 80.0–100.0)
Monocytes Absolute: 0.7 10*3/uL (ref 0.1–1.0)
Monocytes Relative: 9 %
Neutro Abs: 5.5 10*3/uL (ref 1.7–7.7)
Neutrophils Relative %: 66 %
Platelets: 232 10*3/uL (ref 150–400)
RBC: 4.87 MIL/uL (ref 4.22–5.81)
RDW: 13.2 % (ref 11.5–15.5)
WBC: 8.3 10*3/uL (ref 4.0–10.5)
nRBC: 0 % (ref 0.0–0.2)

## 2020-03-17 LAB — BASIC METABOLIC PANEL
Anion gap: 9 (ref 5–15)
BUN: 6 mg/dL (ref 6–20)
CO2: 25 mmol/L (ref 22–32)
Calcium: 8.8 mg/dL — ABNORMAL LOW (ref 8.9–10.3)
Chloride: 104 mmol/L (ref 98–111)
Creatinine, Ser: 0.7 mg/dL (ref 0.61–1.24)
GFR calc Af Amer: >60 mL/min (ref >60)
GFR calc non Af Amer: >60 mL/min (ref >60)
Glucose, Bld: 93 mg/dL (ref 70–99)
Potassium: 3.4 mmol/L — ABNORMAL LOW (ref 3.5–5.1)
Sodium: 138 mmol/L (ref 135–145)

## 2020-03-17 LAB — ECHOCARDIOGRAM COMPLETE BUBBLE STUDY
Area-P 1/2: 2.95 cm2
Calc EF: 67.6 %
S' Lateral: 2.8 cm
Single Plane A2C EF: 66.2 %
Single Plane A4C EF: 65.4 %

## 2020-03-17 LAB — ANTITHROMBIN III: AntiThromb III Func: 110 % (ref 75–120)

## 2020-03-17 LAB — HEMOGLOBIN A1C
Hgb A1c MFr Bld: 5.4 % (ref 4.8–5.6)
Mean Plasma Glucose: 108.28 mg/dL

## 2020-03-17 IMAGING — MR MR CERVICAL SPINE WO/W CM
3 series · 19 of 48 positions shown · IV contrast (Yes GAD)
Comparison: [DATE] MRI head without and with contrast.

CLINICAL DATA: Demyelinating disease

EXAM:
MRI CERVICAL SPINE WITHOUT AND WITH CONTRAST
TECHNIQUE: Multiplanar and multiecho pulse sequences of the cervical spine, to
include the craniocervical junction and cervicothoracic junction,
were obtained without and with intravenous contrast.
CONTRAST:  9mL GADAVIST GADOBUTROL 1 MMOL/ML IV SOLN

[Series 5: T1 · axial · non-contrast · 3.0mm · 0.35mm/px · z∈[-171,-84]mm · 9 of 31 slices shown]
[im 2/31]
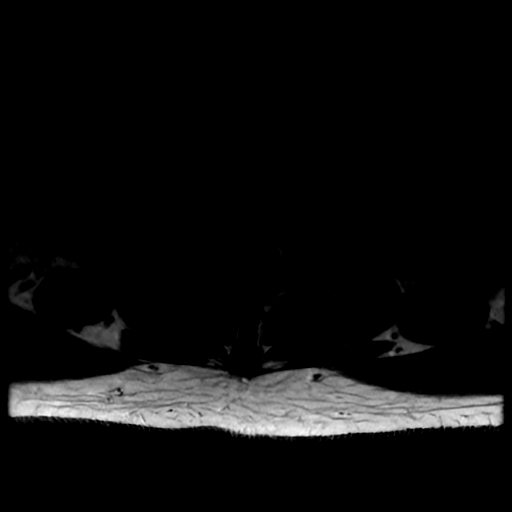
[im 6/31]
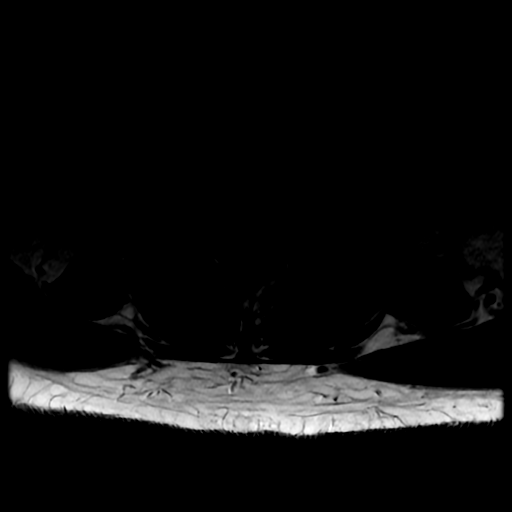
[im 9/31]
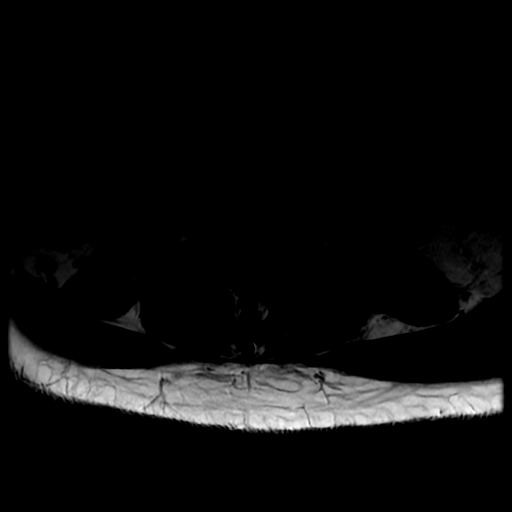
[im 14/31]
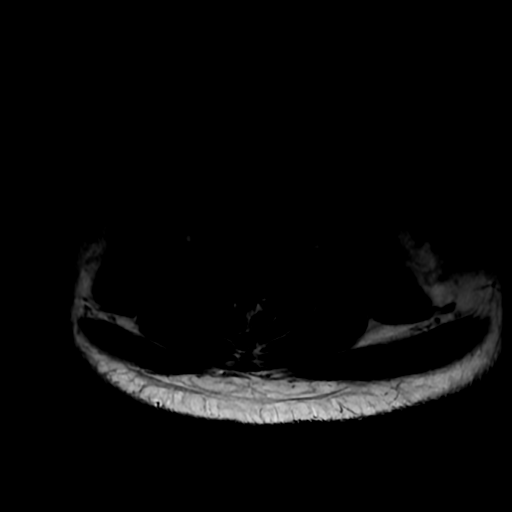
[im 16/31]
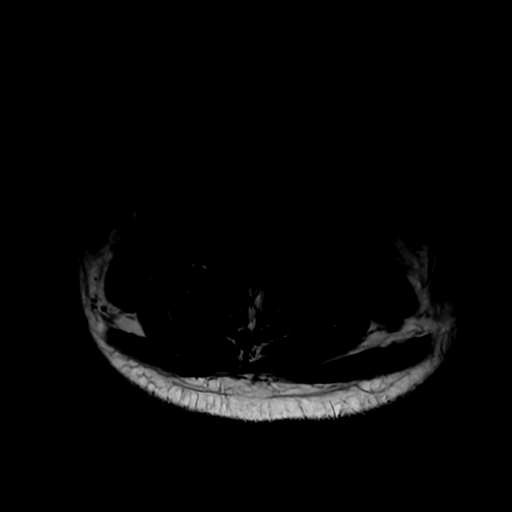
[im 17/31]
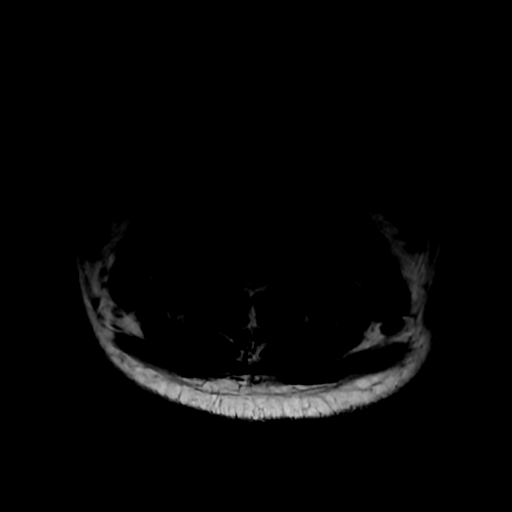
[im 22/31]
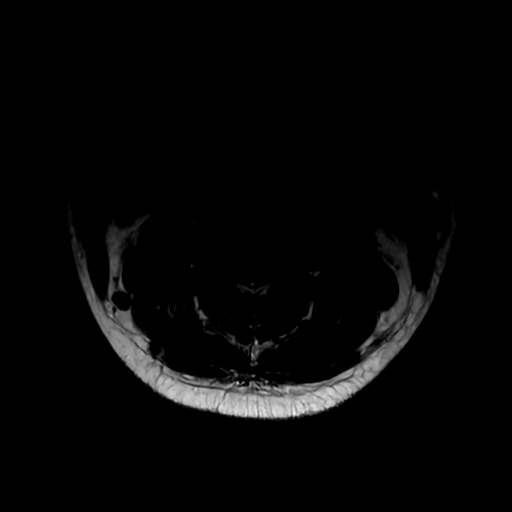
[im 26/31]
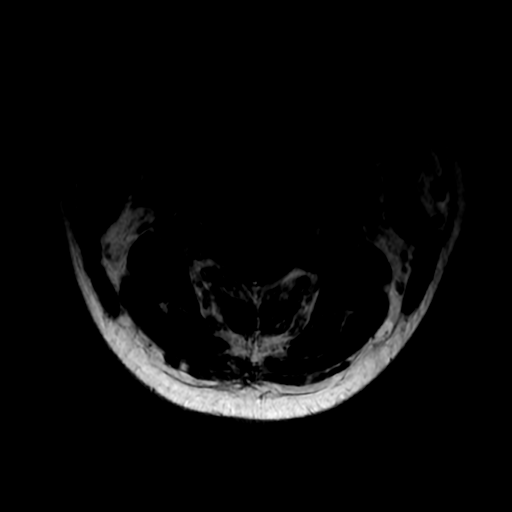
[im 29/31]
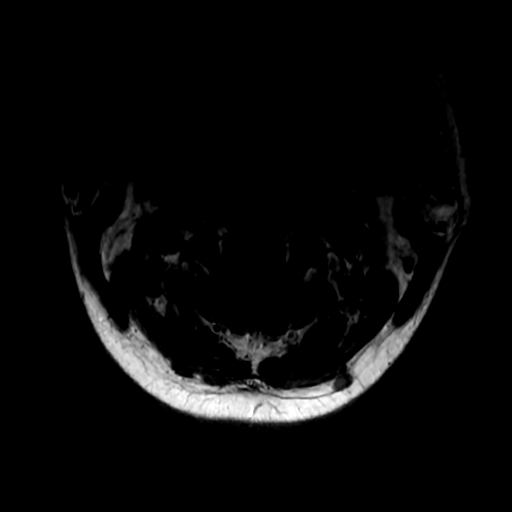

[Series 7: T1 fat-sat post-contrast · sagittal · 3.0mm · 0.43mm/px · 7 of 16 slices shown]
[im 1/16]
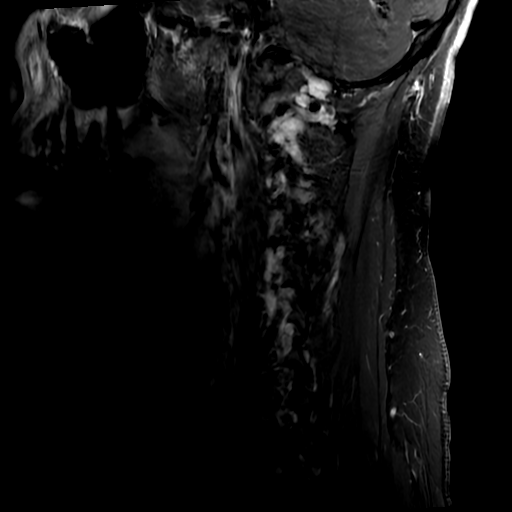
[im 2/16]
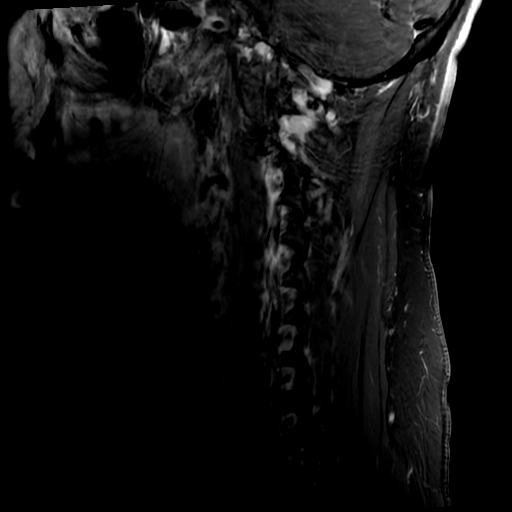
[im 6/16]
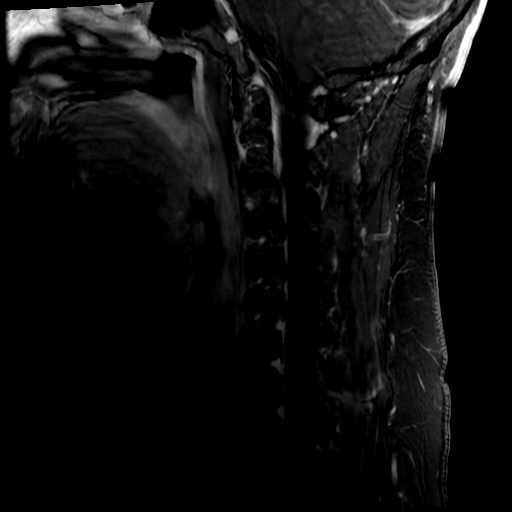
[im 7/16]
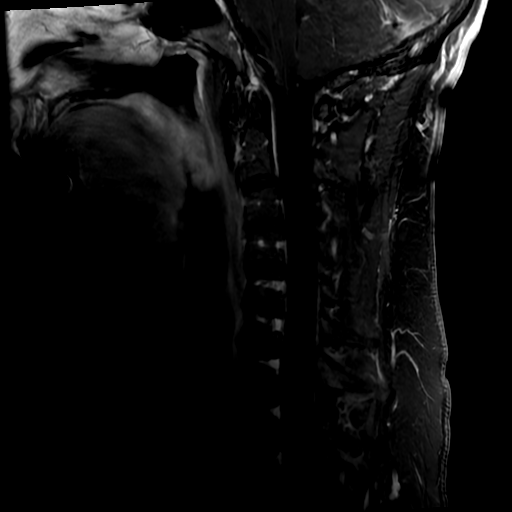
[im 9/16]
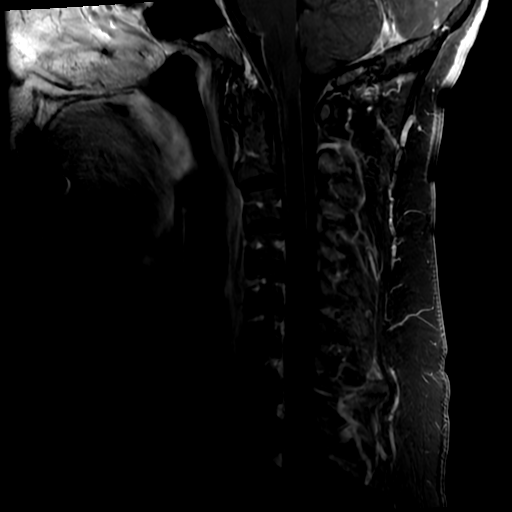
[im 11/16]
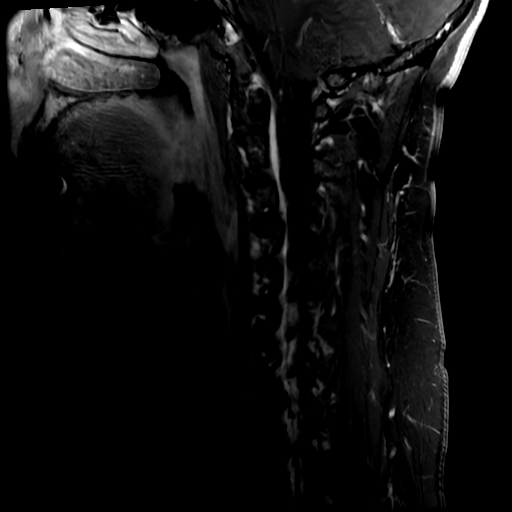
[im 14/16]
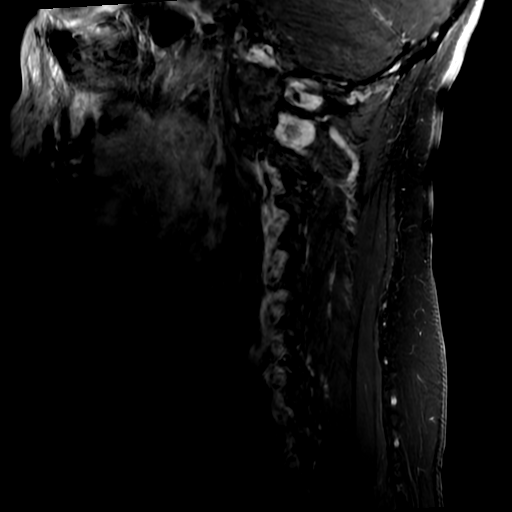

[Series 8: T1 post-contrast · axial · 3.0mm · 0.35mm/px · z∈[-158,-94]mm · 3 of 31 slices shown]
[im 6/31]
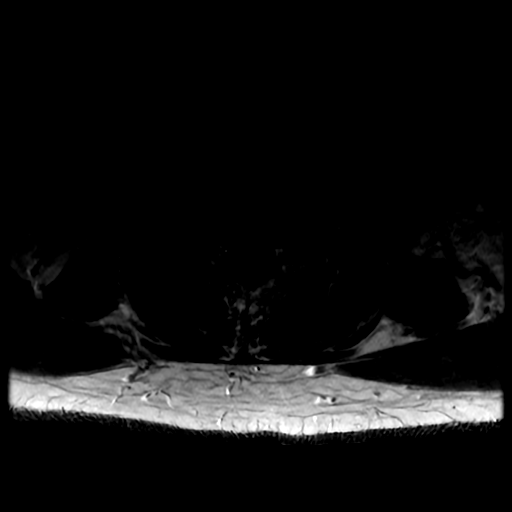
[im 16/31]
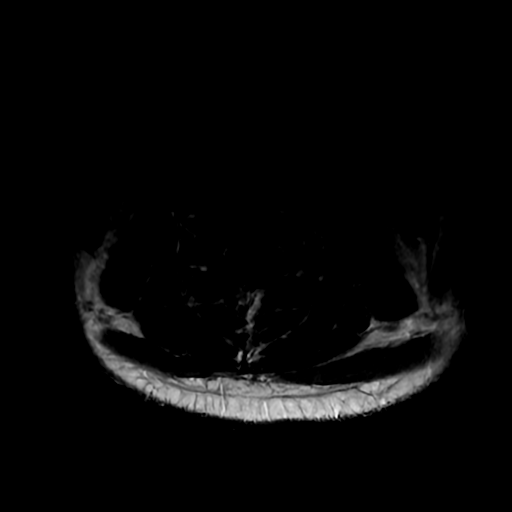
[im 26/31]
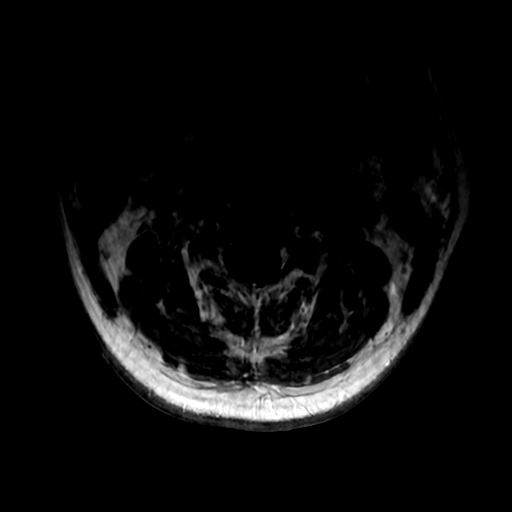

[19 of 48 positions shown; findings below may reference images not displayed]

FINDINGS: Image quality is mildly degraded by motion artifact.

Alignment: Straightening of cervical lordosis.

Vertebrae: Normal bone marrow signal intensity. No focal osseous
lesion. No abnormal enhancement.

Cord: Normal signal and morphology.  No abnormal enhancement.

Posterior Fossa, vertebral arteries: Please see concurrent MRI head.

Disc levels: Mild C5-6 desiccation.  Disc spaces are preserved.

C2-3: No significant disc bulge, spinal canal or neural foraminal
narrowing.

C3-4: Small disc osteophyte complex with uncovertebral degenerative
spurring. Patent spinal canal and neural foramen.

C4-5: No significant disc bulge, spinal canal or neural foraminal
narrowing.

C5-6: No significant disc bulge, spinal canal or neural foraminal
narrowing.

C6-7: No significant disc bulge, spinal canal or neural foraminal
narrowing.

C7-T1: No significant disc bulge, spinal canal or neural foraminal
narrowing.

Paraspinal tissues: Negative.
IMPRESSION: No evidence of demyelination or myelopathy.

No significant spinal canal or neural foraminal narrowing.

## 2020-03-17 IMAGING — MR MR MRA HEAD W/O CM
4 series · 16 of 16 positions shown · IV contrast (Yes GAD)
Comparison: Prior same day MRI head.

CLINICAL DATA: Neuro deficit

EXAM:
MRI HEAD WITH CONTRAST
MRA HEAD WITHOUT CONTRAST
MRA NECK WITHOUT AND WITH CONTRAST
TECHNIQUE: Multiplanar, multiecho pulse sequences of the brain and surrounding
structures were obtained without intravenous contrast. Angiographic
images of the head were obtained using MRA technique without
contrast. Angiographic images of the neck were obtained using MRA
technique with and without intravenous contrast. Carotid stenosis
measurements (when applicable) are obtained utilizing NASCET
criteria, using the distal internal carotid diameter as the
denominator.
CONTRAST:  9mL GADAVIST GADOBUTROL 1 MMOL/ML IV SOLN

[Series 2: ax (id) · axial · 1.0mm · 0.43mm/px · z∈[-66,+22]mm · 6 of 184 slices shown]
[im 1/184]
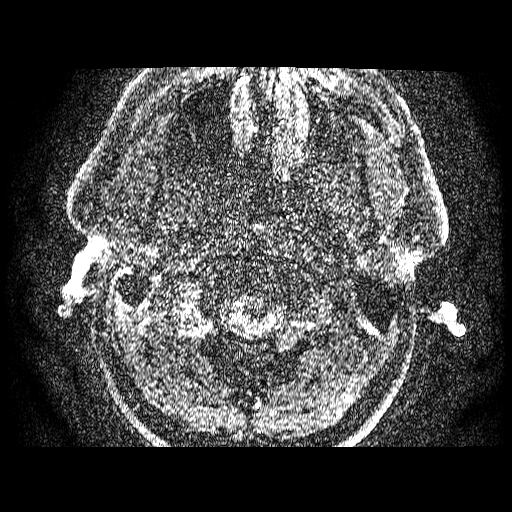
[im 37/184]
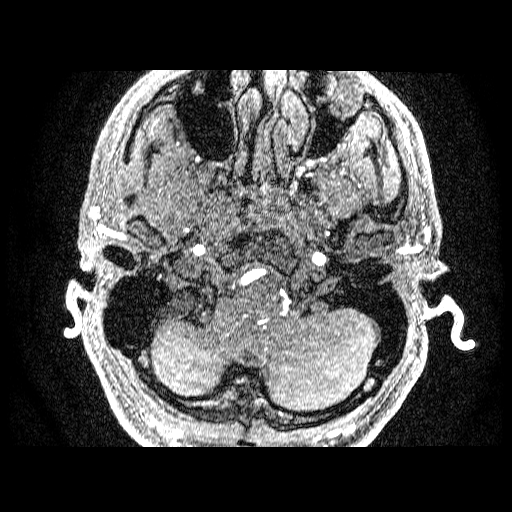
[im 74/184]
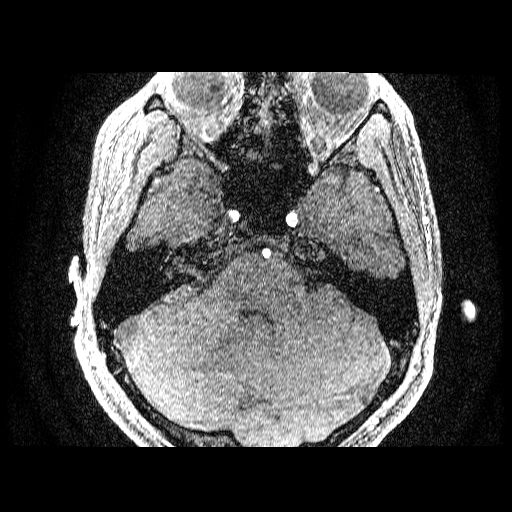
[im 110/184]
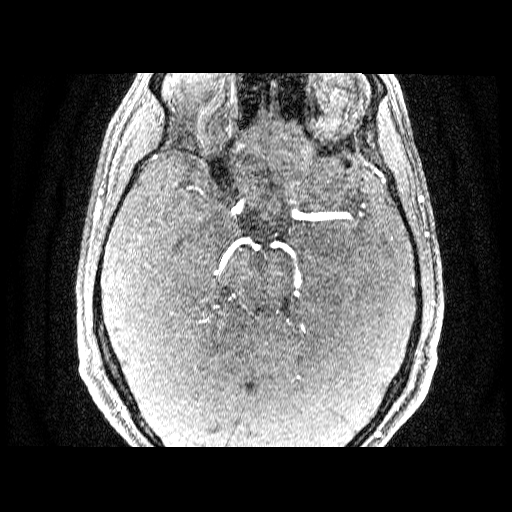
[im 147/184]
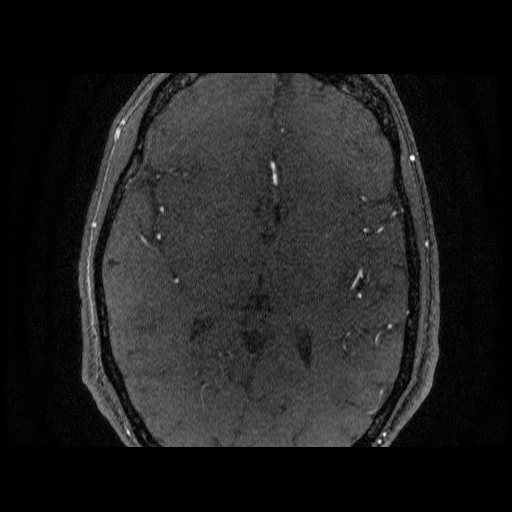
[im 184/184]
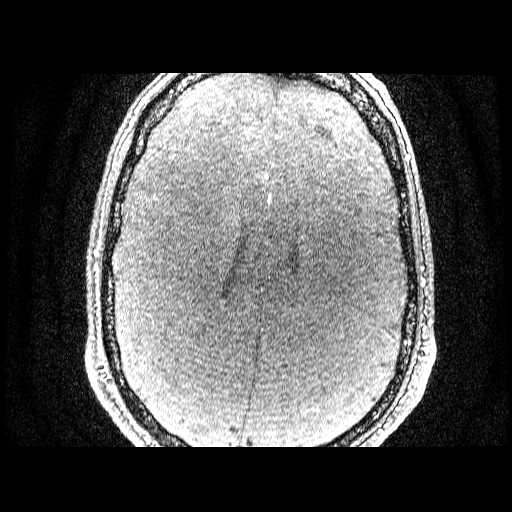

[Series 4: TOF · axial · 2.4mm · 0.47mm/px · z∈[-202,-49]mm · 4 of 136 slices shown]
[im 1/136]
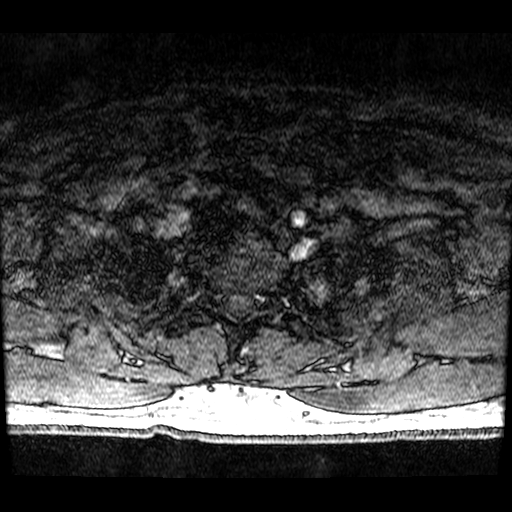
[im 46/136]
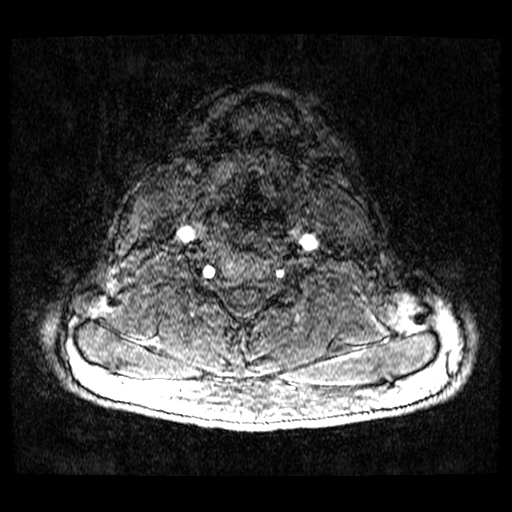
[im 91/136]
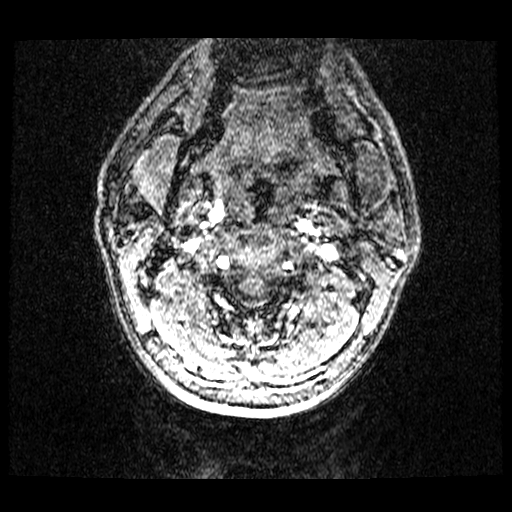
[im 136/136]
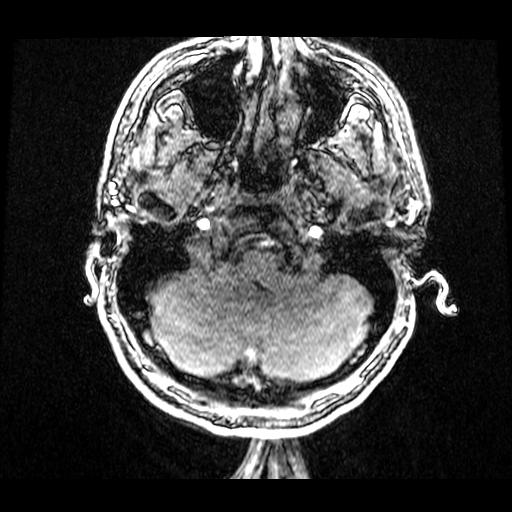

[((date))-((date)) · coronal · 1.2mm · 0.59mm/px · 3 of 113 slices shown (1 of 2)]
[im 1/113]
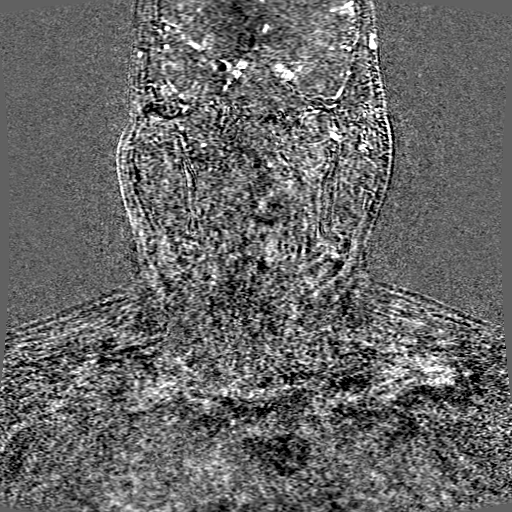
[im 57/113]
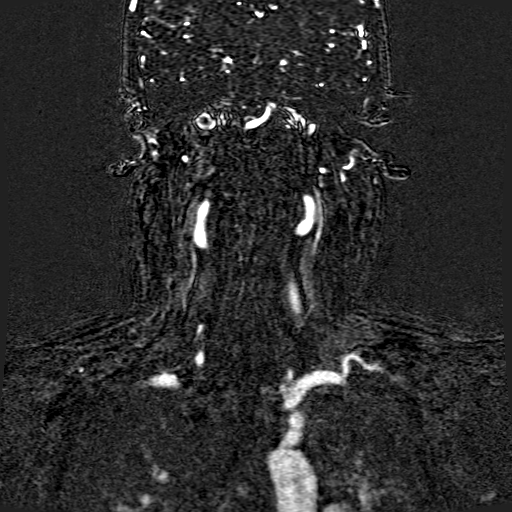
[im 113/113]
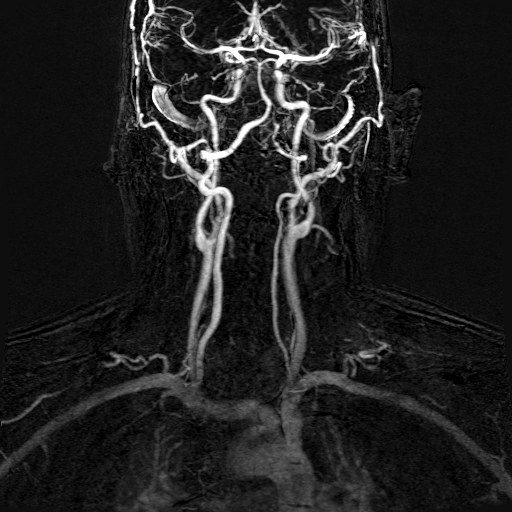

[((date))-((date)) · coronal · 1.2mm · 0.59mm/px · 3 of 113 slices shown (2 of 2)]
[im 1/113]
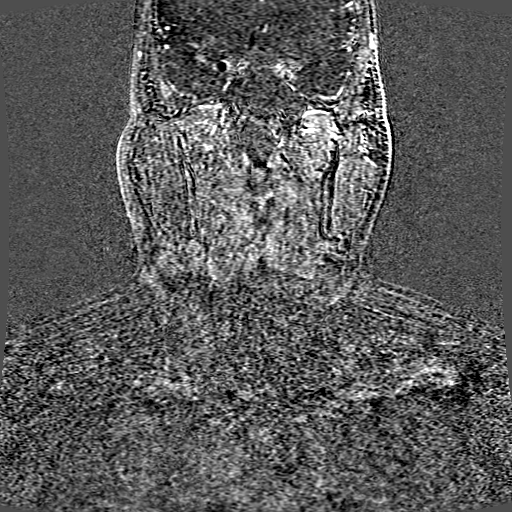
[im 57/113]
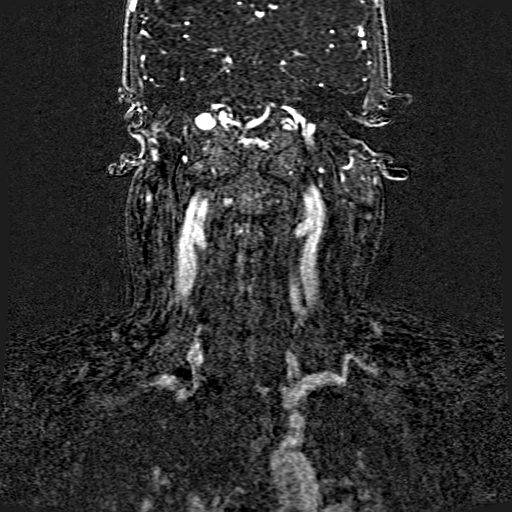
[im 113/113]
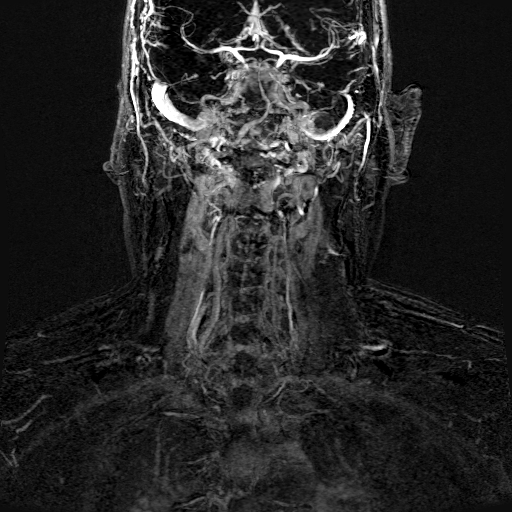

[16 of 16 positions shown; findings below may reference images not displayed]

FINDINGS: MRI HEAD FINDINGS

Brain: No abnormal enhancement within site of restricted diffusion
involving the superior right cerebellum and vermis. No midline
shift, ventriculomegaly or extra-axial fluid collection. No mass
lesion.

Vascular: Normal flow voids.

Skull and upper cervical spine: Normal marrow signal.

Sinuses/Orbits: Normal orbits. Aerated sinuses and mastoid air
cells.

Other: None.

MRA HEAD FINDINGS

Anterior circulation: Normal caliber patent appearance of the
internal carotid, middle and anterior cerebral arteries. No
significant stenosis, proximal occlusion, aneurysm, or vascular
malformation.

Posterior circulation: Dominant right vertebral artery. Proximally
patent right PICA. Short-segment defects involving the proximal left
PICA may reflect high-grade narrowing. Dominant right AICA. Patent
normal caliber basilar artery. Patent normal caliber bilateral
posterior cerebral arteries. The bilateral superior cerebellar
arteries are proximally patent, diminutive on the left. No
significant stenosis, proximal occlusion, aneurysm, or vascular
malformation.

Venous sinuses: Patent

Anatomic variants: None of significance.

MRA NECK FINDINGS

There is no high-grade narrowing or focal aneurysm involving the
bilateral carotid arteries. No evidence of dissection.

The bilateral vertebral arteries are patent and demonstrate
antegrade flow. Dominant right vertebral artery. No evidence of
high-grade narrowing or focal aneurysm.
IMPRESSION: No abnormal enhancement. This is suggestive of acute infarct
involving the superior right cerebellum and vermis.

High-grade short-segment narrowing involving the proximal left PICA.
Otherwise unremarkable MRA head.

Normal MRA neck.

## 2020-03-17 IMAGING — MR MR HEAD W/ CM
2 series · 36 of 48 positions shown · IV contrast (G GAD)
Comparison: Prior same day MRI head.

CLINICAL DATA: Neuro deficit

EXAM:
MRI HEAD WITH CONTRAST
MRA HEAD WITHOUT CONTRAST
MRA NECK WITHOUT AND WITH CONTRAST
TECHNIQUE: Multiplanar, multiecho pulse sequences of the brain and surrounding
structures were obtained without intravenous contrast. Angiographic
images of the head were obtained using MRA technique without
contrast. Angiographic images of the neck were obtained using MRA
technique with and without intravenous contrast. Carotid stenosis
measurements (when applicable) are obtained utilizing NASCET
criteria, using the distal internal carotid diameter as the
denominator.
CONTRAST:  9mL GADAVIST GADOBUTROL 1 MMOL/ML IV SOLN

[Series 2: T1 · axial · 3.0mm · 0.94mm/px · z∈[-78,+77]mm · 25 of 56 slices shown (1 of 2)]
[im 1/56]
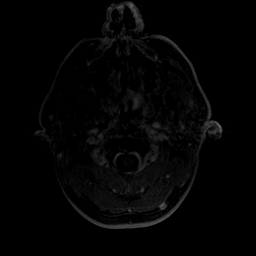
[im 2/56]
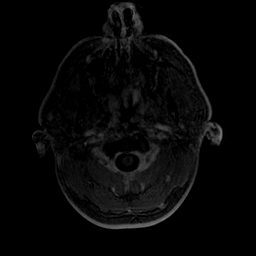
[im 4/56]
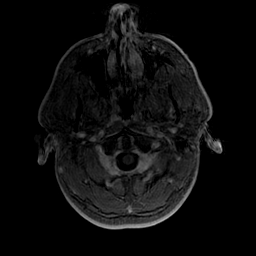
[im 6/56]
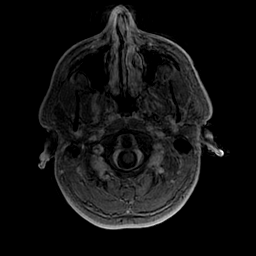
[im 8/56]
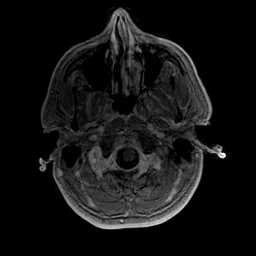
[im 10/56]
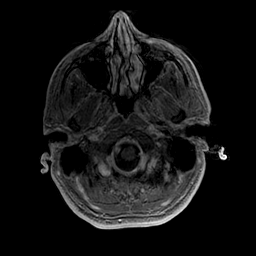
[im 12/56]
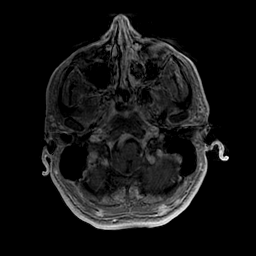
[im 13/56]
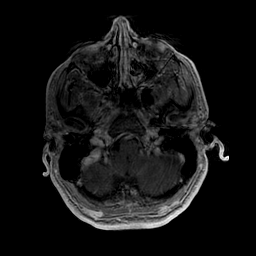
[im 15/56]
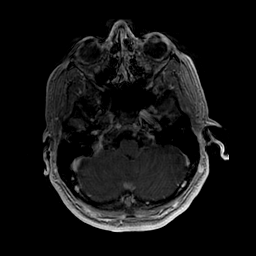
[im 17/56]
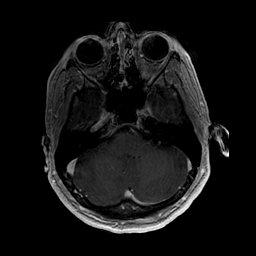
[im 19/56]
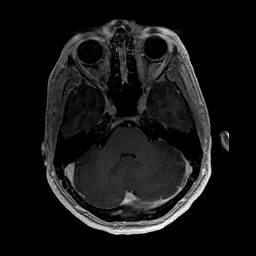
[im 21/56]
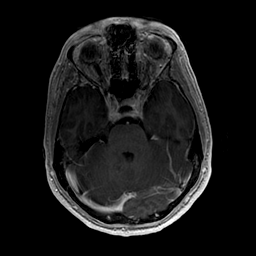
[im 23/56]
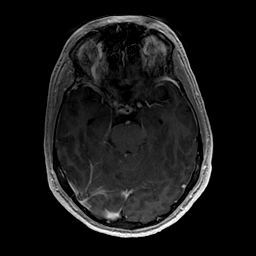
[im 24/56]
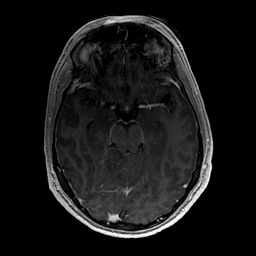
[im 26/56]
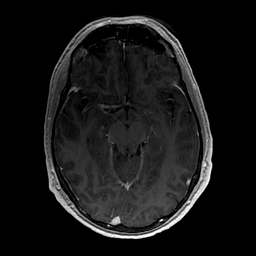
[im 28/56]
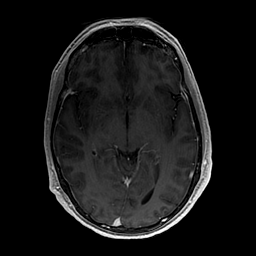
[im 30/56]
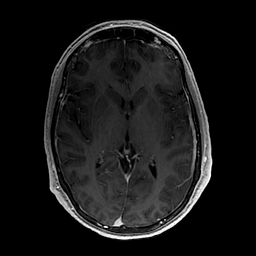
[im 32/56]
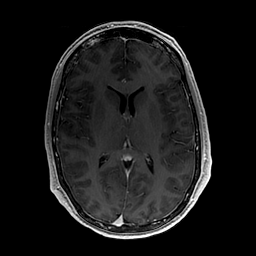
[im 34/56]
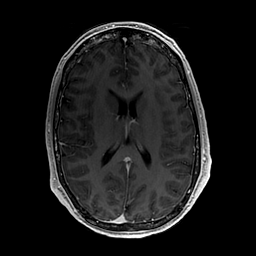
[im 35/56]
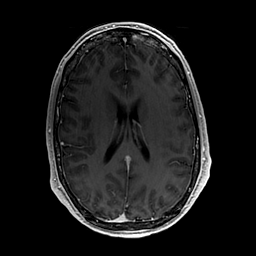
[im 37/56]
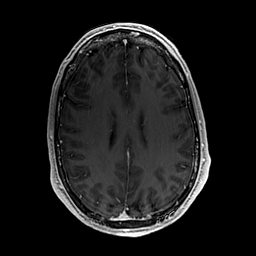
[im 39/56]
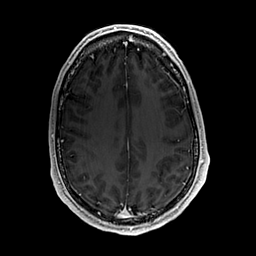
[im 41/56]
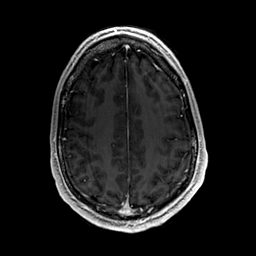
[im 46/56]
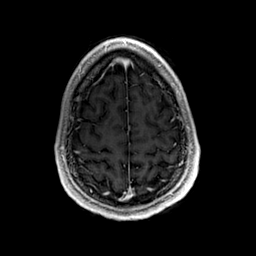
[im 54/56]
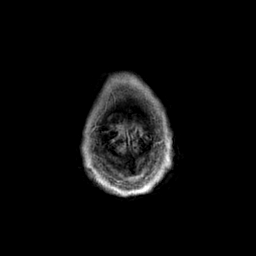

[Series 3: T1 · coronal · 5.0mm · 0.43mm/px · 11 of 31 slices shown (2 of 2)]
[im 2/31]
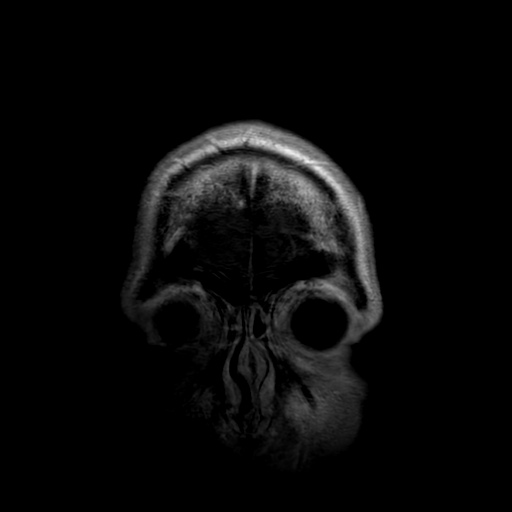
[im 4/31]
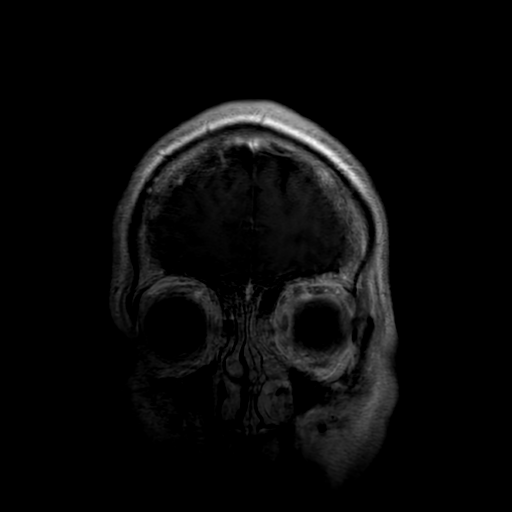
[im 6/31]
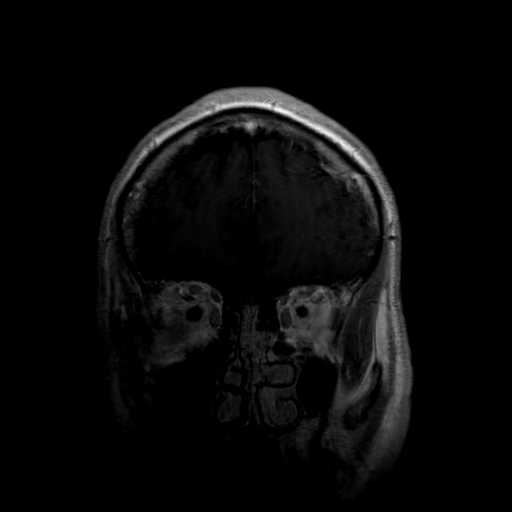
[im 10/31]
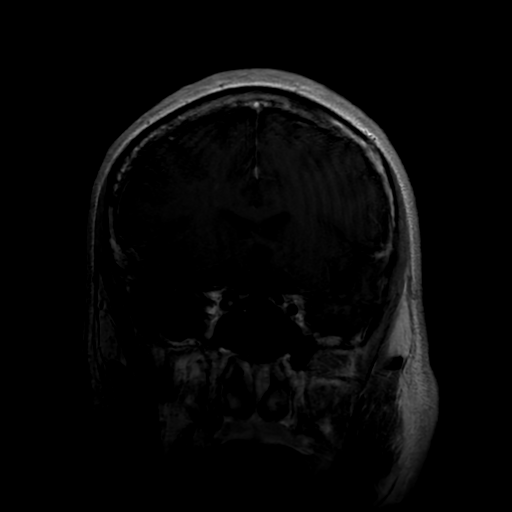
[im 14/31]
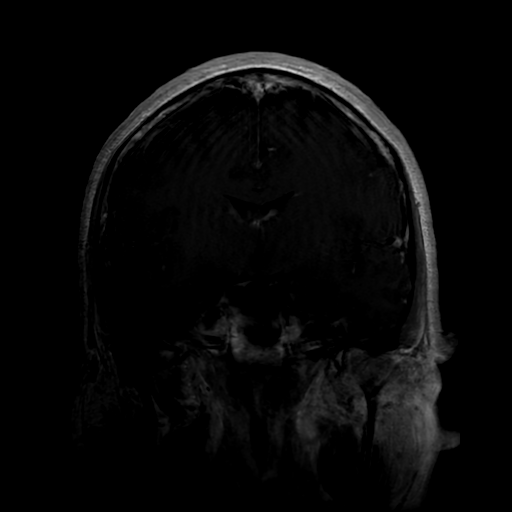
[im 16/31]
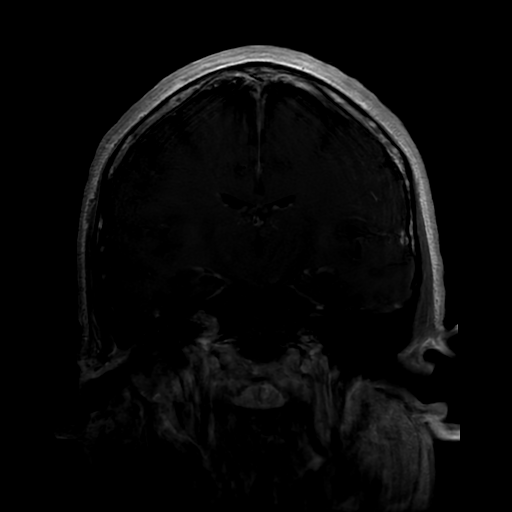
[im 17/31]
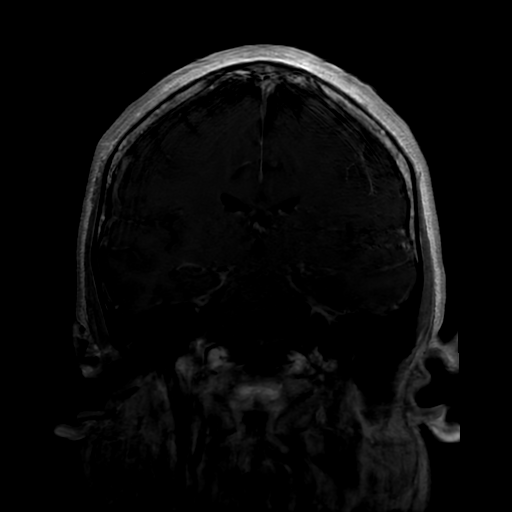
[im 21/31]
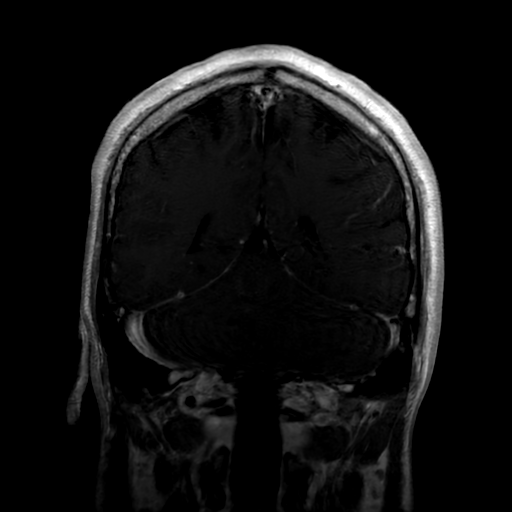
[im 25/31]
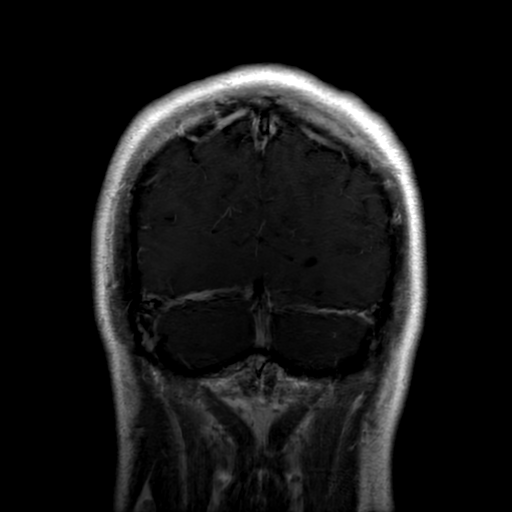
[im 27/31]
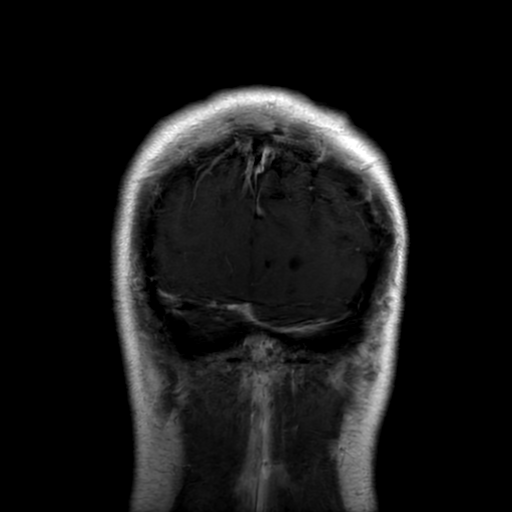
[im 29/31]
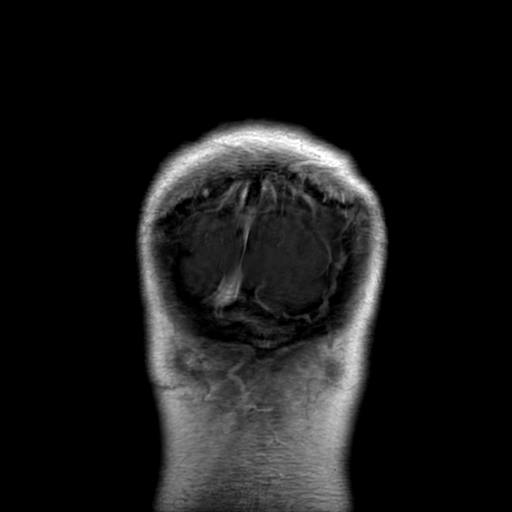

[36 of 48 positions shown; findings below may reference images not displayed]

FINDINGS: MRI HEAD FINDINGS

Brain: No abnormal enhancement within site of restricted diffusion
involving the superior right cerebellum and vermis. No midline
shift, ventriculomegaly or extra-axial fluid collection. No mass
lesion.

Vascular: Normal flow voids.

Skull and upper cervical spine: Normal marrow signal.

Sinuses/Orbits: Normal orbits. Aerated sinuses and mastoid air
cells.

Other: None.

MRA HEAD FINDINGS

Anterior circulation: Normal caliber patent appearance of the
internal carotid, middle and anterior cerebral arteries. No
significant stenosis, proximal occlusion, aneurysm, or vascular
malformation.

Posterior circulation: Dominant right vertebral artery. Proximally
patent right PICA. Short-segment defects involving the proximal left
PICA may reflect high-grade narrowing. Dominant right AICA. Patent
normal caliber basilar artery. Patent normal caliber bilateral
posterior cerebral arteries. The bilateral superior cerebellar
arteries are proximally patent, diminutive on the left. No
significant stenosis, proximal occlusion, aneurysm, or vascular
malformation.

Venous sinuses: Patent

Anatomic variants: None of significance.

MRA NECK FINDINGS

There is no high-grade narrowing or focal aneurysm involving the
bilateral carotid arteries. No evidence of dissection.

The bilateral vertebral arteries are patent and demonstrate
antegrade flow. Dominant right vertebral artery. No evidence of
high-grade narrowing or focal aneurysm.
IMPRESSION: No abnormal enhancement. This is suggestive of acute infarct
involving the superior right cerebellum and vermis.

High-grade short-segment narrowing involving the proximal left PICA.
Otherwise unremarkable MRA head.

Normal MRA neck.

## 2020-03-17 IMAGING — MR MR CERVICAL SPINE WO/W CM
4 of 5 series · 19 of 48 positions shown · IV contrast (gadavist)
Comparison: [DATE] MRI head without and with contrast.

CLINICAL DATA: Demyelinating disease

EXAM:
MRI CERVICAL SPINE WITHOUT AND WITH CONTRAST
TECHNIQUE: Multiplanar and multiecho pulse sequences of the cervical spine, to
include the craniocervical junction and cervicothoracic junction,
were obtained without and with intravenous contrast.
CONTRAST:  9mL GADAVIST GADOBUTROL 1 MMOL/ML IV SOLN

[Series 3: T2 · sagittal · 3.0mm · 0.43mm/px · 4 of 16 slices shown (1 of 2)]
[im 1/16]
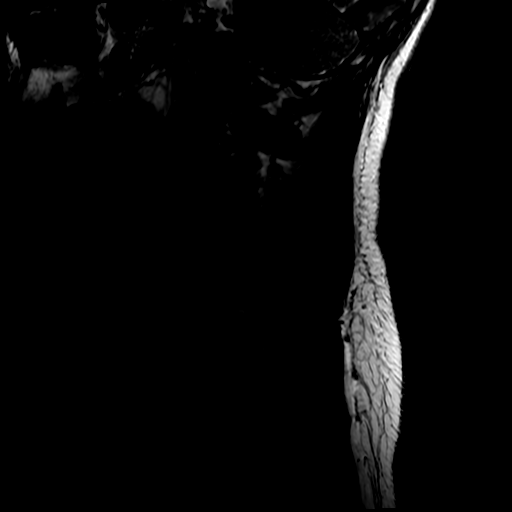
[im 6/16]
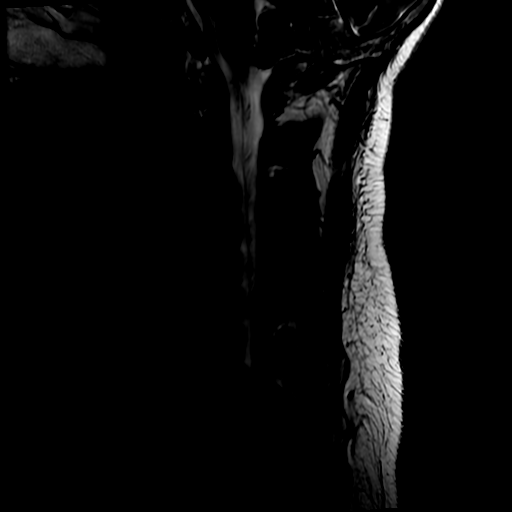
[im 11/16]
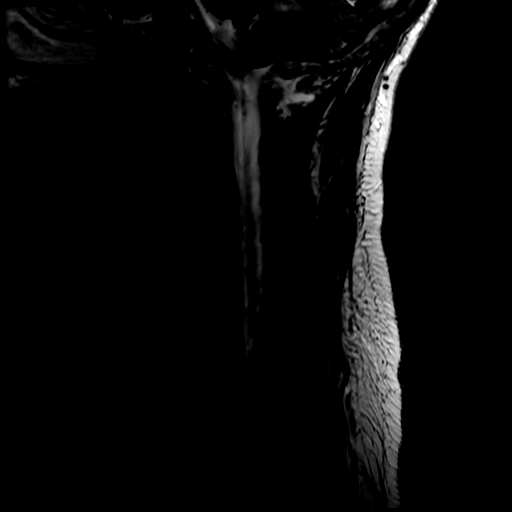
[im 16/16]
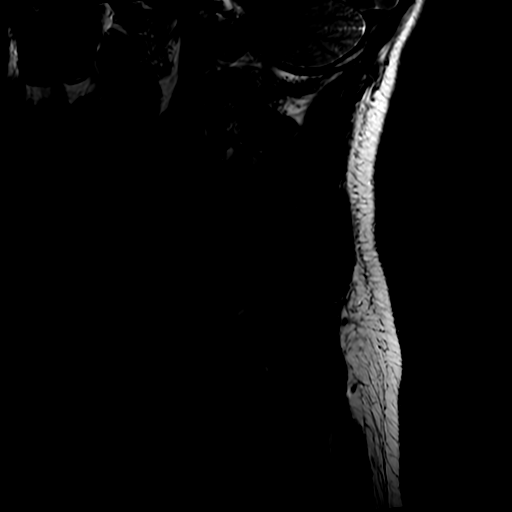

[Series 4: FLAIR · sagittal · 3.0mm · 0.43mm/px · 3 of 16 slices shown]
[im 1/16]
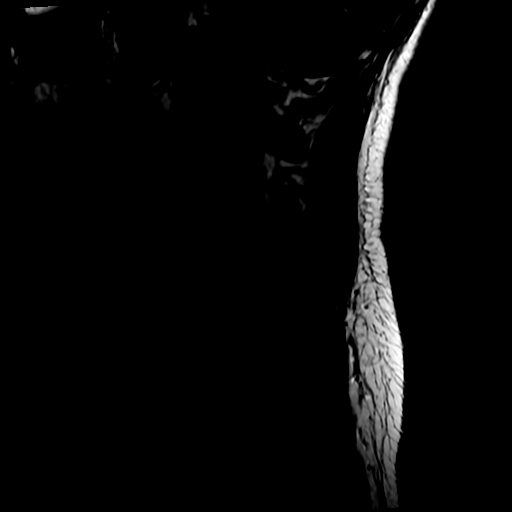
[im 8/16]
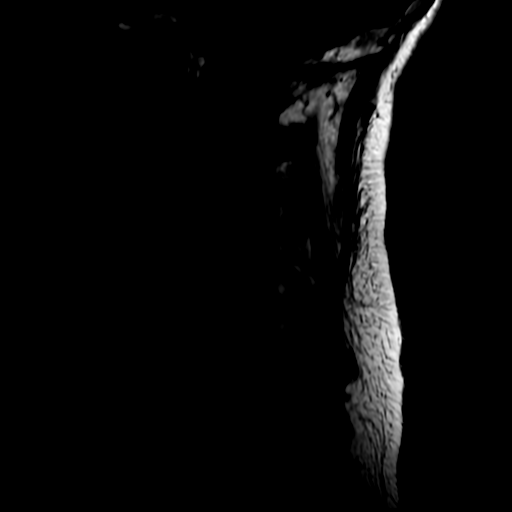
[im 16/16]
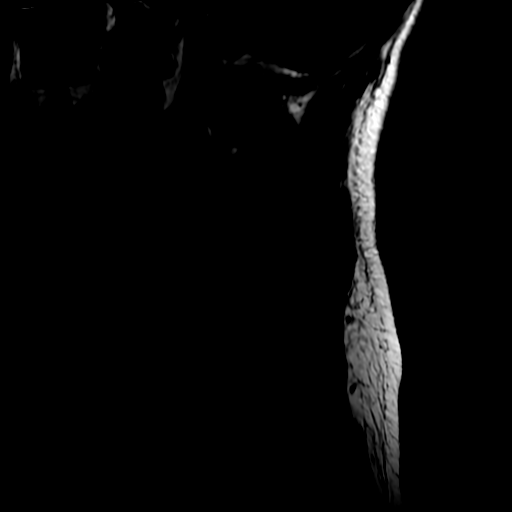

[Series 5: STIR · sagittal · 3.0mm · 0.43mm/px · 3 of 16 slices shown]
[im 1/16]
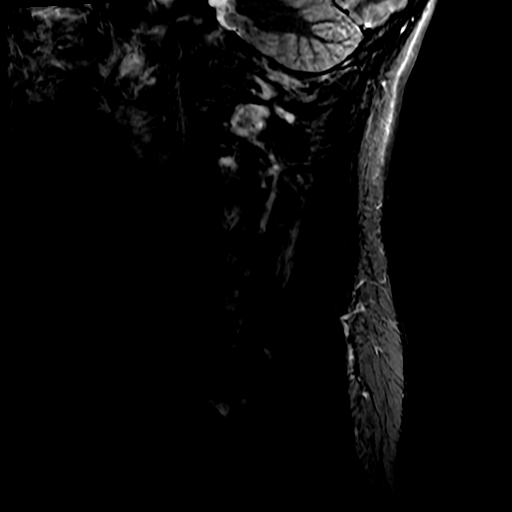
[im 8/16]
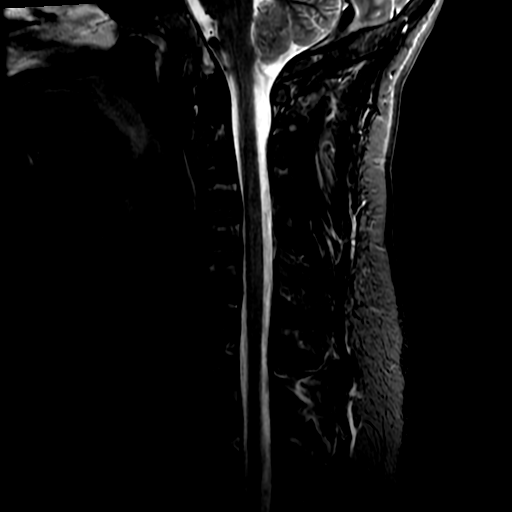
[im 16/16]
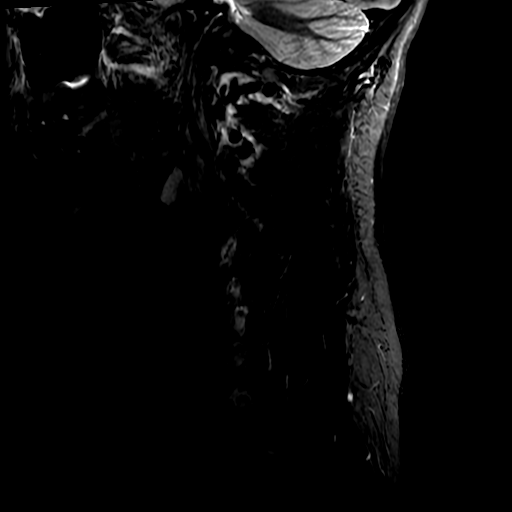

[Series 7: T2 · axial · 3.0mm · 0.35mm/px · z∈[-201,-108]mm · 9 of 30 slices shown (2 of 2)]
[im 1/30]
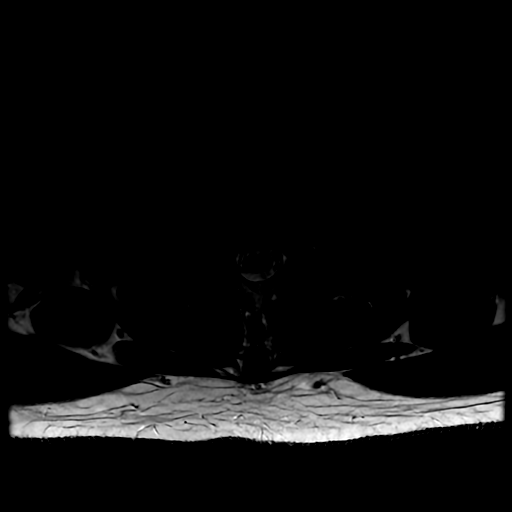
[im 4/30]
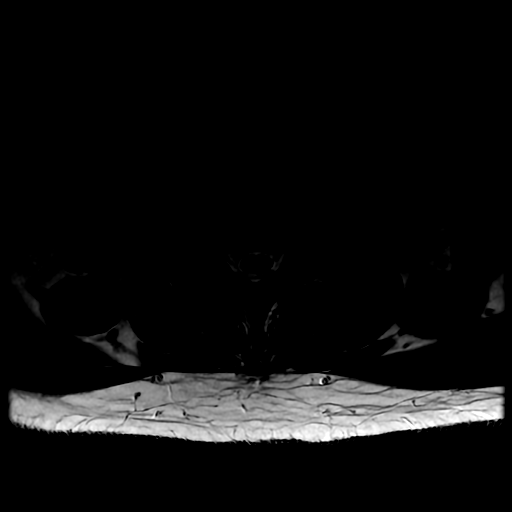
[im 8/30]
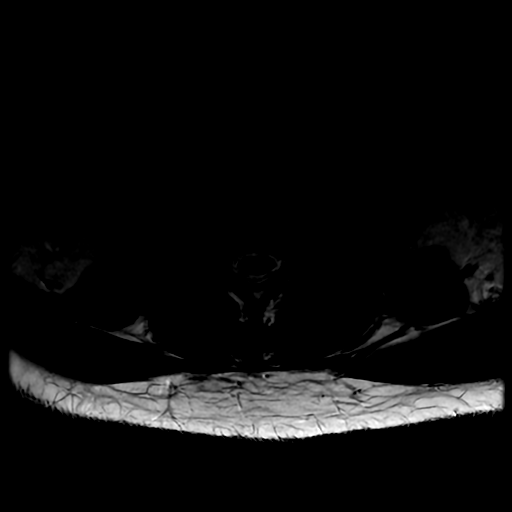
[im 11/30]
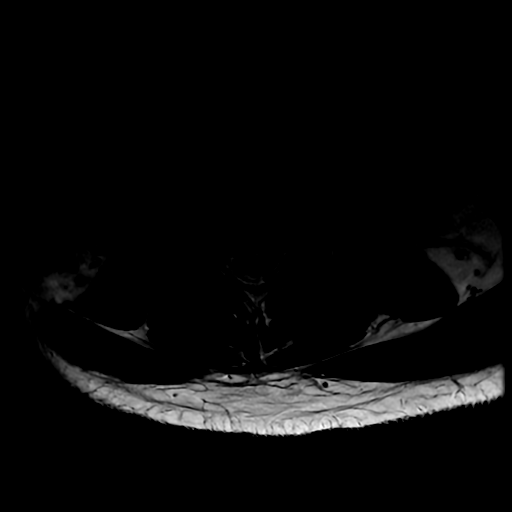
[im 15/30]
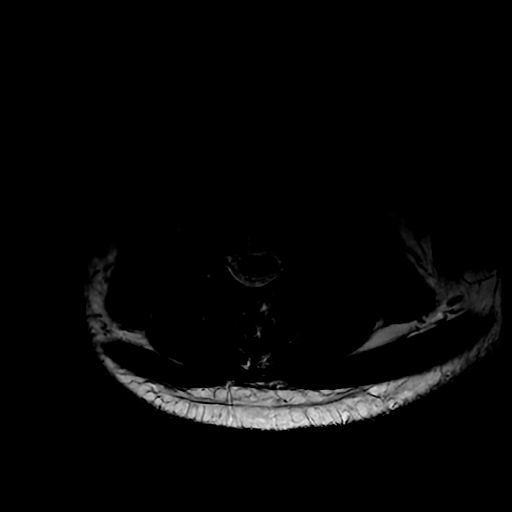
[im 19/30]
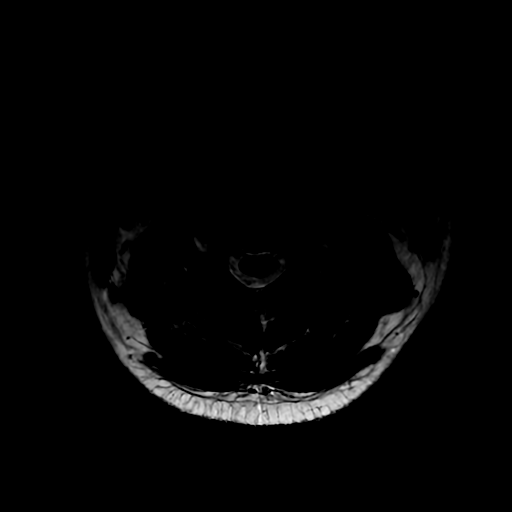
[im 22/30]
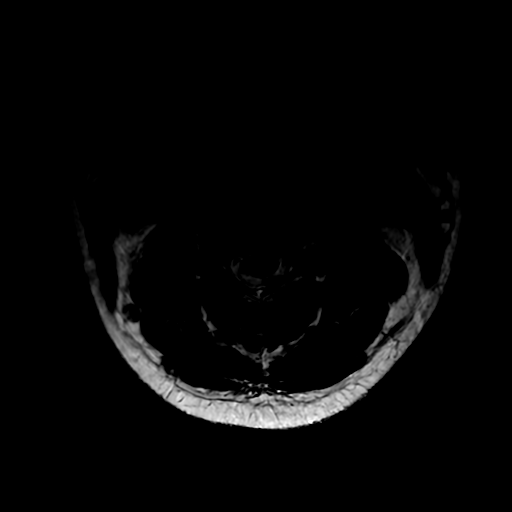
[im 26/30]
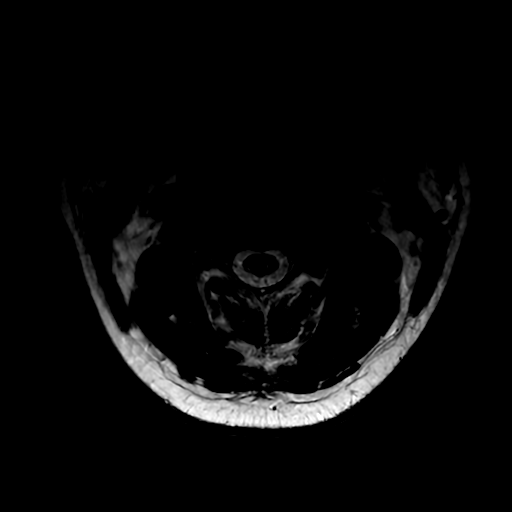
[im 30/30]
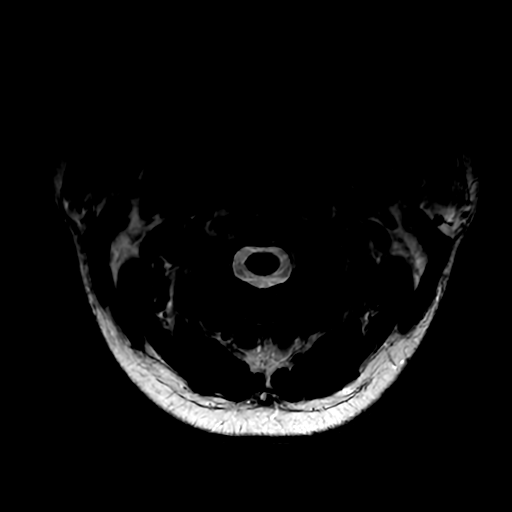

[19 of 48 positions shown; findings below may reference images not displayed]

FINDINGS: Image quality is mildly degraded by motion artifact.

Alignment: Straightening of cervical lordosis.

Vertebrae: Normal bone marrow signal intensity. No focal osseous
lesion. No abnormal enhancement.

Cord: Normal signal and morphology.  No abnormal enhancement.

Posterior Fossa, vertebral arteries: Please see concurrent MRI head.

Disc levels: Mild C5-6 desiccation.  Disc spaces are preserved.

C2-3: No significant disc bulge, spinal canal or neural foraminal
narrowing.

C3-4: Small disc osteophyte complex with uncovertebral degenerative
spurring. Patent spinal canal and neural foramen.

C4-5: No significant disc bulge, spinal canal or neural foraminal
narrowing.

C5-6: No significant disc bulge, spinal canal or neural foraminal
narrowing.

C6-7: No significant disc bulge, spinal canal or neural foraminal
narrowing.

C7-T1: No significant disc bulge, spinal canal or neural foraminal
narrowing.

Paraspinal tissues: Negative.
IMPRESSION: No evidence of demyelination or myelopathy.

No significant spinal canal or neural foraminal narrowing.

## 2020-03-17 IMAGING — MR MR HEAD W/O CM
7 of 11 series · 32 of 48 positions shown · non-contrast
Comparison: None.

CLINICAL DATA: Neuro deficit

EXAM:
MRI HEAD WITHOUT CONTRAST
TECHNIQUE: Multiplanar, multiecho pulse sequences of the brain and surrounding
structures were obtained without intravenous contrast.

[Series 2: DWI · axial · 3.0mm · 0.94mm/px · z∈[-67,+78]mm · 7 of 100 slices shown (1 of 3)]
[im 1/100]
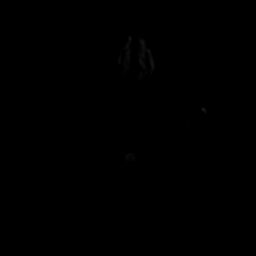
[im 17/100]
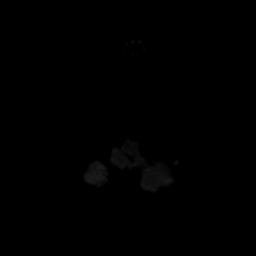
[im 34/100]
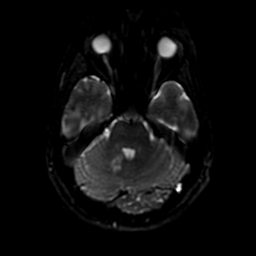
[im 50/100]
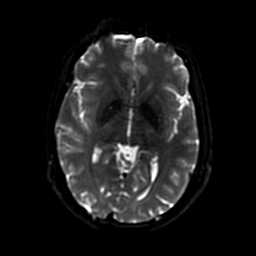
[im 67/100]
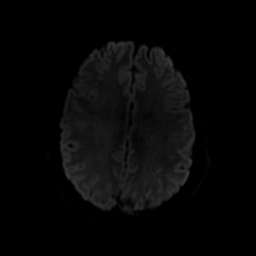
[im 83/100]
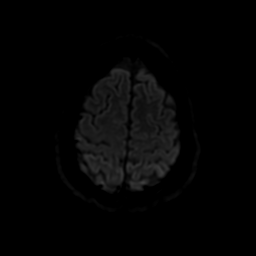
[im 100/100]
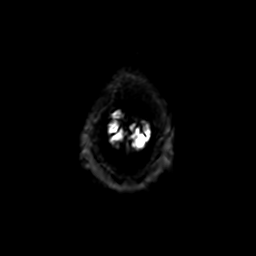

[Series 3: DWI · coronal · 4.0mm · 0.94mm/px · 6 of 74 slices shown (2 of 3)]
[im 1/74]
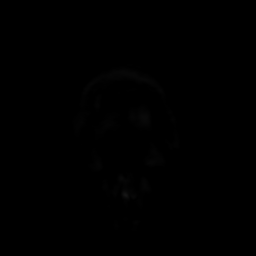
[im 15/74]
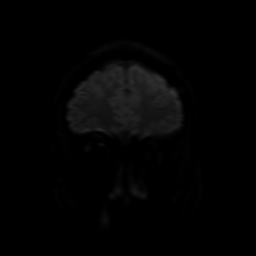
[im 30/74]
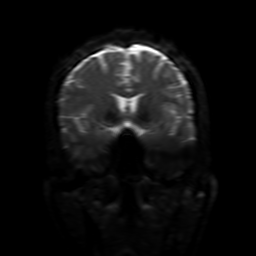
[im 44/74]
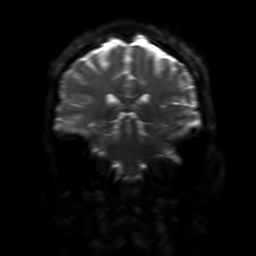
[im 59/74]
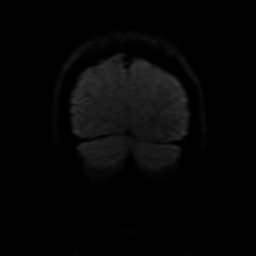
[im 74/74]
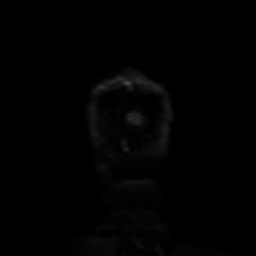

[Series 4: FLAIR · sagittal · 5.0mm · 0.23mm/px · 2 of 25 slices shown (1 of 2)]
[im 1/25]
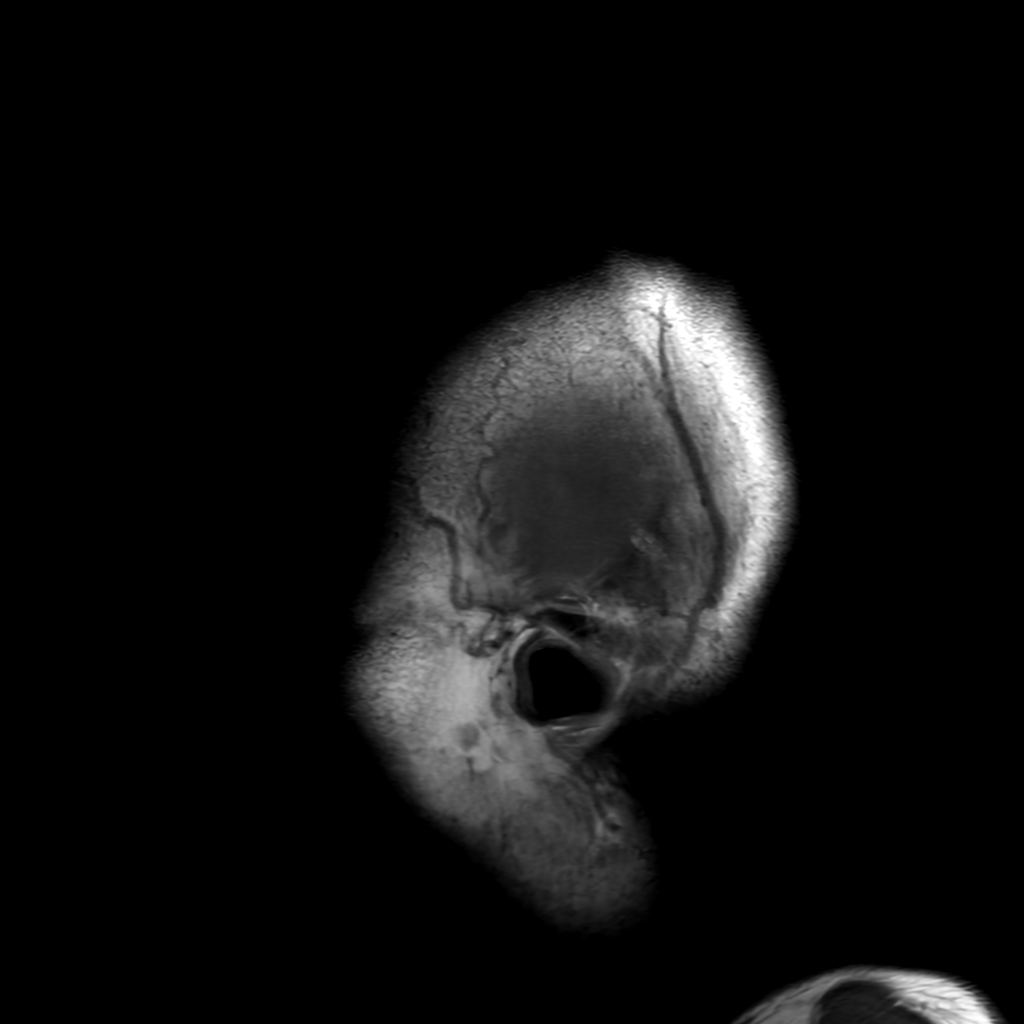
[im 25/25]
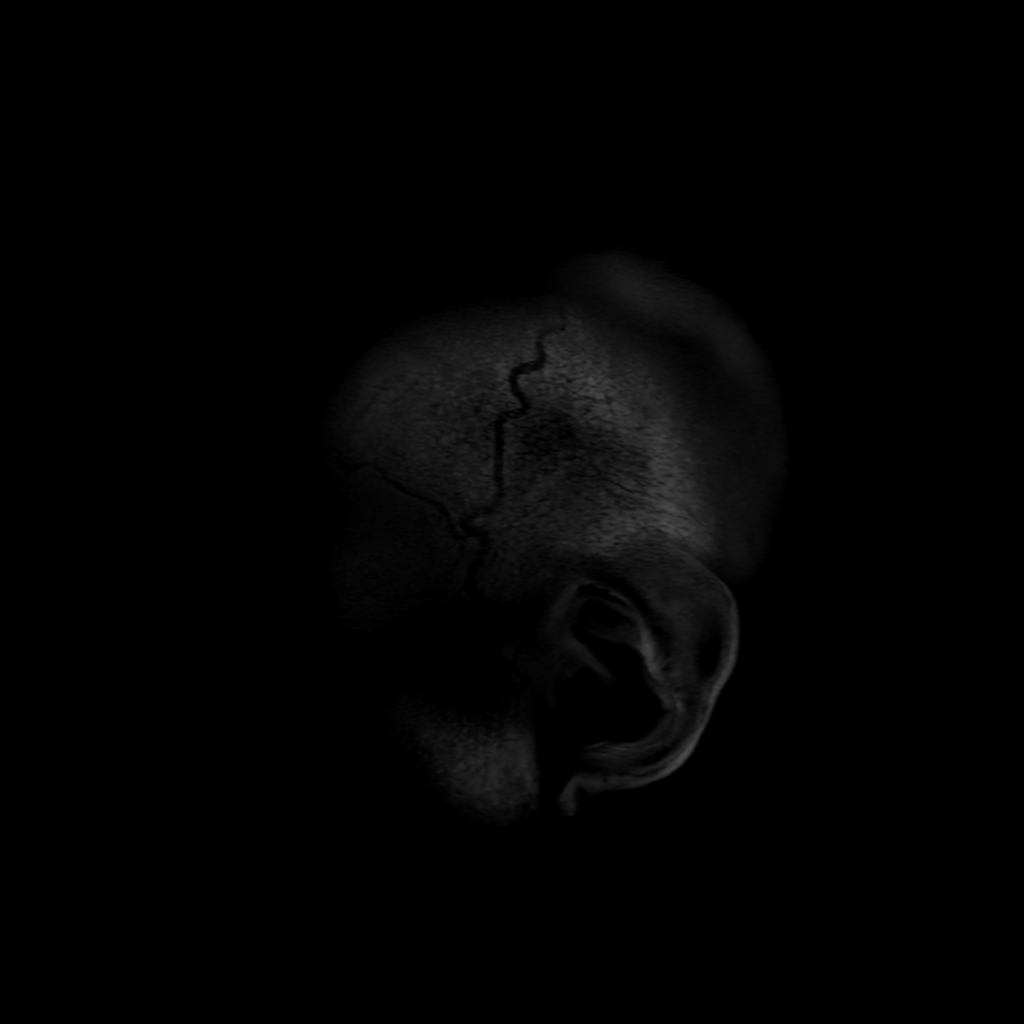

[Series 6: FLAIR · axial · 3.0mm · 0.43mm/px · z∈[-59,+78]mm · 2 of 24 slices shown (2 of 2)]
[im 1/24]
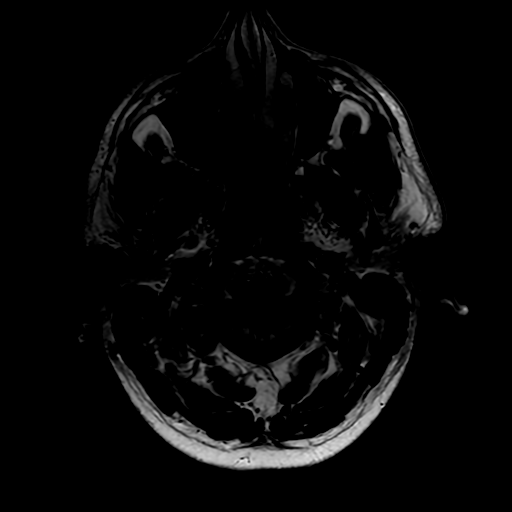
[im 24/24]
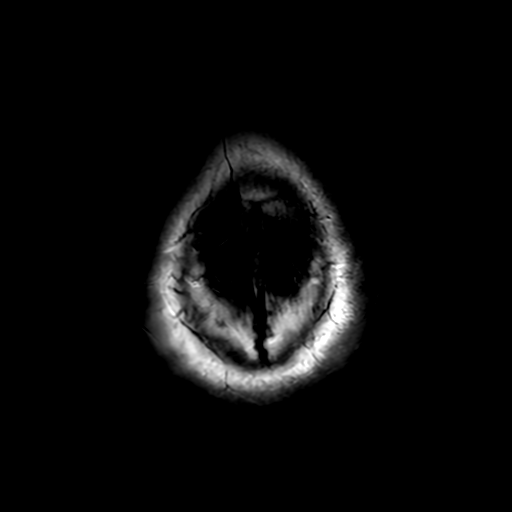

[Series 210: DWI · axial · 3.0mm · 0.94mm/px · z∈[-67,+78]mm · 8 of 100 slices shown (3 of 3)]
[im 1/100]
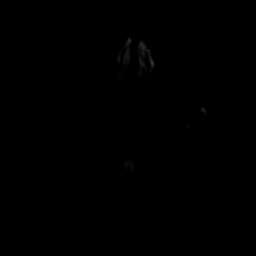
[im 15/100]
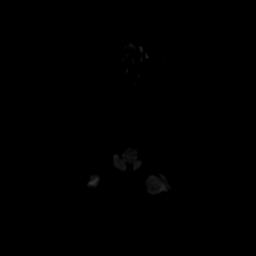
[im 29/100]
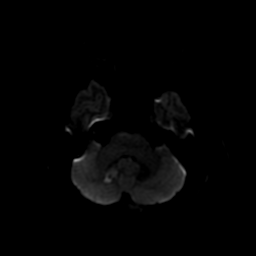
[im 43/100]
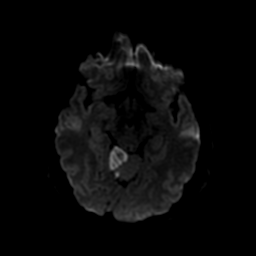
[im 57/100]
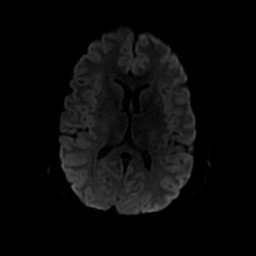
[im 71/100]
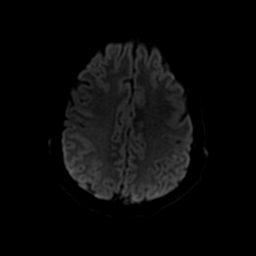
[im 85/100]
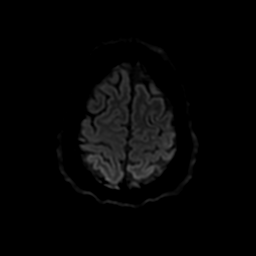
[im 100/100]
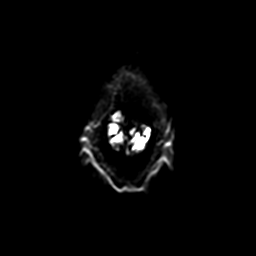

[Series 250: ADC · axial · 3.0mm · 0.94mm/px · z∈[-67,+78]mm · 4 of 50 slices shown (1 of 2)]
[im 1/50]
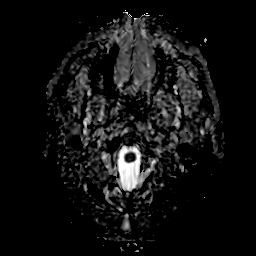
[im 17/50]
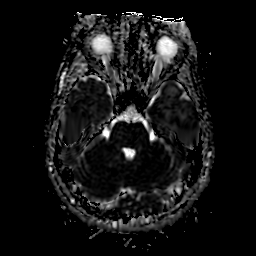
[im 33/50]
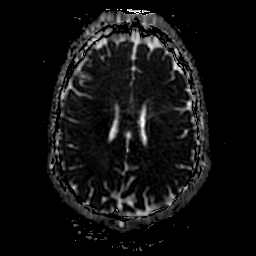
[im 50/50]
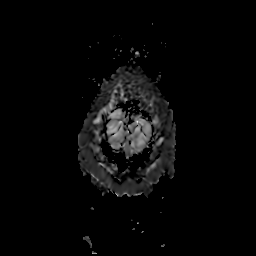

[Series 350: ADC · coronal · 4.0mm · 0.94mm/px · 3 of 37 slices shown (2 of 2)]
[im 1/37]
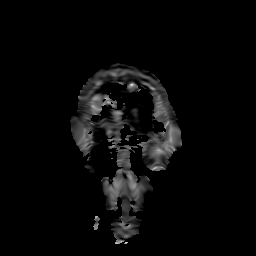
[im 19/37]
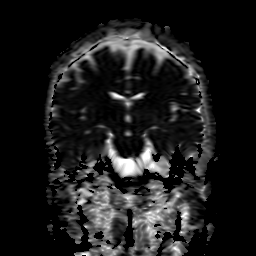
[im 37/37]
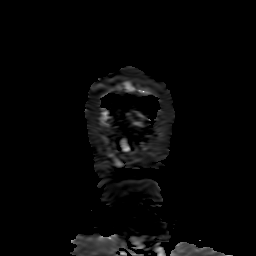

[32 of 48 positions shown; findings below may reference images not displayed]

FINDINGS: Brain: Focal restricted diffusion involving the superior right
cerebellum and vermis with associated small foci of SWI signal
dropout. No midline shift, ventriculomegaly or extra-axial fluid
collection. No mass lesion.

Vascular: Normal flow voids.

Skull and upper cervical spine: Normal marrow signal.

Sinuses/Orbits: Normal orbits. Clear paranasal sinuses. No mastoid
effusion.

Other: None.
IMPRESSION: Restricted diffusion involving the superior right cerebellum and
vermis. Likely an acute infarct however cannot exclude
demyelination. Consider postcontrast imaging for further evaluation.

These results were called by telephone at the time of interpretation
on [DATE] at [DATE] to provider BAXI , who verbally
acknowledged these results.

## 2020-03-17 MED ORDER — ASPIRIN 325 MG PO TABS
325.0000 mg | ORAL_TABLET | Freq: Every day | ORAL | Status: DC
  Administered 2020-03-17 – 2020-03-18 (×2): 325 mg via ORAL
  Filled 2020-03-17 (×2): qty 1

## 2020-03-17 MED ORDER — ASPIRIN 300 MG RE SUPP
300.0000 mg | Freq: Every day | RECTAL | Status: DC
  Filled 2020-03-17: qty 1

## 2020-03-17 MED ORDER — GADOBUTROL 1 MMOL/ML IV SOLN
9.0000 mL | Freq: Once | INTRAVENOUS | Status: AC | PRN
  Administered 2020-03-17: 9 mL via INTRAVENOUS

## 2020-03-17 MED ORDER — STROKE: EARLY STAGES OF RECOVERY BOOK
Freq: Once | Status: AC
  Filled 2020-03-17 (×2): qty 1

## 2020-03-17 MED ORDER — ASPIRIN 81 MG PO CHEW: 81.0000 mg | CHEWABLE_TABLET | Freq: Every day | ORAL | Status: DC

## 2020-03-17 MED ORDER — ATORVASTATIN CALCIUM 80 MG PO TABS
80.0000 mg | ORAL_TABLET | Freq: Every day | ORAL | Status: DC
  Administered 2020-03-17 – 2020-03-19 (×3): 80 mg via ORAL
  Filled 2020-03-17 (×3): qty 1

## 2020-03-17 MED ORDER — ACETAMINOPHEN 325 MG PO TABS
650.0000 mg | ORAL_TABLET | Freq: Four times a day (QID) | ORAL | Status: DC | PRN
  Administered 2020-03-17 – 2020-03-18 (×3): 650 mg via ORAL
  Filled 2020-03-17 (×3): qty 2

## 2020-03-17 NOTE — Progress Notes (Addendum)
STROKE TEAM PROGRESS NOTE   INTERVAL HISTORY I have personally reviewed history of presenting illness with the patient, electronic medical records and imaging films in PACS.  He denies any prior history of prolonged travel,, prior history of DVT, PE, strokes or heart attacks in the family at a young age.  Denies any neck pain, injury, fall or high risk behavior or neck manipulation.  Vitals:   03/17/20 0852 03/17/20 1050 03/17/20 1208 03/17/20 1355  BP: (!) 150/102 (!) 148/91 (!) 130/99 (!) 143/99  Pulse: 97  90 97  Resp: 16 12 15  (!) 24  Temp: 98.8 F (37.1 C) 98.6 F (37 C) 98.5 F (36.9 C) 97.7 F (36.5 C)  TempSrc: Oral Oral Oral Oral  SpO2: 93% 92% 95% 94%  Weight:      Height:       CBC:  Recent Labs  Lab 03/15/20 1700 03/15/20 1700 03/16/20 0509 03/17/20 0559  WBC 13.5*   < > 9.2 8.3  NEUTROABS 10.0*  --   --  5.5  HGB 16.3   < > 14.3 13.8  HCT 49.6   < > 44.7 42.5  MCV 87.3   < > 88.7 87.3  PLT 270   < > 246 232   < > = values in this interval not displayed.   Basic Metabolic Panel:  Recent Labs  Lab 03/15/20 1700 03/15/20 1700 03/16/20 0509 03/17/20 0559  NA 141   < > 140 138  K 4.9   < > 3.8 3.4*  CL 106   < > 106 104  CO2 21*   < > 24 25  GLUCOSE 112*   < > 110* 93  BUN 9   < > 5* 6  CREATININE 0.98   < > 0.83 0.70  CALCIUM 9.4   < > 9.1 8.8*  MG 2.4  --   --   --    < > = values in this interval not displayed.   Lipid Panel:  Recent Labs  Lab 03/17/20 0559  CHOL 157  TRIG 85  HDL 44  CHOLHDL 3.6  VLDL 17  LDLCALC 96   HgbA1c:  Recent Labs  Lab 03/17/20 0559  HGBA1C 5.4   Urine Drug Screen:  Recent Labs  Lab 03/16/20 0156  LABOPIA NONE DETECTED  COCAINSCRNUR NONE DETECTED  LABBENZ POSITIVE*  AMPHETMU NONE DETECTED  THCU NONE DETECTED  LABBARB NONE DETECTED    Alcohol Level No results for input(s): ETH in the last 168 hours.  IMAGING past 24 hours MR ANGIO HEAD WO CONTRAST  Result Date: 03/17/2020 CLINICAL DATA:  Neuro  deficit EXAM: MRI HEAD WITH CONTRAST MRA HEAD WITHOUT CONTRAST MRA NECK WITHOUT AND WITH CONTRAST TECHNIQUE: Multiplanar, multiecho pulse sequences of the brain and surrounding structures were obtained without intravenous contrast. Angiographic images of the head were obtained using MRA technique without contrast. Angiographic images of the neck were obtained using MRA technique with and without intravenous contrast. Carotid stenosis measurements (when applicable) are obtained utilizing NASCET criteria, using the distal internal carotid diameter as the denominator. CONTRAST:  9mL GADAVIST GADOBUTROL 1 MMOL/ML IV SOLN COMPARISON:  Prior same day MRI head. FINDINGS: MRI HEAD FINDINGS Brain: No abnormal enhancement within site of restricted diffusion involving the superior right cerebellum and vermis. No midline shift, ventriculomegaly or extra-axial fluid collection. No mass lesion. Vascular: Normal flow voids. Skull and upper cervical spine: Normal marrow signal. Sinuses/Orbits: Normal orbits. Aerated sinuses and mastoid air cells. Other: None. MRA HEAD FINDINGS  Anterior circulation: Normal caliber patent appearance of the internal carotid, middle and anterior cerebral arteries. No significant stenosis, proximal occlusion, aneurysm, or vascular malformation. Posterior circulation: Dominant right vertebral artery. Proximally patent right PICA. Short-segment defects involving the proximal left PICA may reflect high-grade narrowing. Dominant right AICA. Patent normal caliber basilar artery. Patent normal caliber bilateral posterior cerebral arteries. The bilateral superior cerebellar arteries are proximally patent, diminutive on the left. No significant stenosis, proximal occlusion, aneurysm, or vascular malformation. Venous sinuses: Patent Anatomic variants: None of significance. MRA NECK FINDINGS There is no high-grade narrowing or focal aneurysm involving the bilateral carotid arteries. No evidence of dissection. The  bilateral vertebral arteries are patent and demonstrate antegrade flow. Dominant right vertebral artery. No evidence of high-grade narrowing or focal aneurysm. IMPRESSION: No abnormal enhancement. This is suggestive of acute infarct involving the superior right cerebellum and vermis. High-grade short-segment narrowing involving the proximal left PICA. Otherwise unremarkable MRA head. Normal MRA neck. Electronically Signed   By: Stana Bunting M.D.   On: 03/17/2020 09:13   MR ANGIO NECK W WO CONTRAST  Result Date: 03/17/2020 CLINICAL DATA:  Neuro deficit EXAM: MRI HEAD WITH CONTRAST MRA HEAD WITHOUT CONTRAST MRA NECK WITHOUT AND WITH CONTRAST TECHNIQUE: Multiplanar, multiecho pulse sequences of the brain and surrounding structures were obtained without intravenous contrast. Angiographic images of the head were obtained using MRA technique without contrast. Angiographic images of the neck were obtained using MRA technique with and without intravenous contrast. Carotid stenosis measurements (when applicable) are obtained utilizing NASCET criteria, using the distal internal carotid diameter as the denominator. CONTRAST:  66mL GADAVIST GADOBUTROL 1 MMOL/ML IV SOLN COMPARISON:  Prior same day MRI head. FINDINGS: MRI HEAD FINDINGS Brain: No abnormal enhancement within site of restricted diffusion involving the superior right cerebellum and vermis. No midline shift, ventriculomegaly or extra-axial fluid collection. No mass lesion. Vascular: Normal flow voids. Skull and upper cervical spine: Normal marrow signal. Sinuses/Orbits: Normal orbits. Aerated sinuses and mastoid air cells. Other: None. MRA HEAD FINDINGS Anterior circulation: Normal caliber patent appearance of the internal carotid, middle and anterior cerebral arteries. No significant stenosis, proximal occlusion, aneurysm, or vascular malformation. Posterior circulation: Dominant right vertebral artery. Proximally patent right PICA. Short-segment defects  involving the proximal left PICA may reflect high-grade narrowing. Dominant right AICA. Patent normal caliber basilar artery. Patent normal caliber bilateral posterior cerebral arteries. The bilateral superior cerebellar arteries are proximally patent, diminutive on the left. No significant stenosis, proximal occlusion, aneurysm, or vascular malformation. Venous sinuses: Patent Anatomic variants: None of significance. MRA NECK FINDINGS There is no high-grade narrowing or focal aneurysm involving the bilateral carotid arteries. No evidence of dissection. The bilateral vertebral arteries are patent and demonstrate antegrade flow. Dominant right vertebral artery. No evidence of high-grade narrowing or focal aneurysm. IMPRESSION: No abnormal enhancement. This is suggestive of acute infarct involving the superior right cerebellum and vermis. High-grade short-segment narrowing involving the proximal left PICA. Otherwise unremarkable MRA head. Normal MRA neck. Electronically Signed   By: Stana Bunting M.D.   On: 03/17/2020 09:13   MR BRAIN WO CONTRAST  Result Date: 03/17/2020 CLINICAL DATA:  Neuro deficit EXAM: MRI HEAD WITHOUT CONTRAST TECHNIQUE: Multiplanar, multiecho pulse sequences of the brain and surrounding structures were obtained without intravenous contrast. COMPARISON:  None. FINDINGS: Brain: Focal restricted diffusion involving the superior right cerebellum and vermis with associated small foci of SWI signal dropout. No midline shift, ventriculomegaly or extra-axial fluid collection. No mass lesion. Vascular: Normal flow voids. Skull and upper  cervical spine: Normal marrow signal. Sinuses/Orbits: Normal orbits. Clear paranasal sinuses. No mastoid effusion. Other: None. IMPRESSION: Restricted diffusion involving the superior right cerebellum and vermis. Likely an acute infarct however cannot exclude demyelination. Consider postcontrast imaging for further evaluation. These results were called by  telephone at the time of interpretation on 03/17/2020 at 7:30 am to provider [facility] , who verbally acknowledged these results. Electronically Signed   By: Stana Bunting M.D.   On: 03/17/2020 07:33   MR BRAIN W CONTRAST  Result Date: 03/17/2020 CLINICAL DATA:  Neuro deficit EXAM: MRI HEAD WITH CONTRAST MRA HEAD WITHOUT CONTRAST MRA NECK WITHOUT AND WITH CONTRAST TECHNIQUE: Multiplanar, multiecho pulse sequences of the brain and surrounding structures were obtained without intravenous contrast. Angiographic images of the head were obtained using MRA technique without contrast. Angiographic images of the neck were obtained using MRA technique with and without intravenous contrast. Carotid stenosis measurements (when applicable) are obtained utilizing NASCET criteria, using the distal internal carotid diameter as the denominator. CONTRAST:  9mL GADAVIST GADOBUTROL 1 MMOL/ML IV SOLN COMPARISON:  Prior same day MRI head. FINDINGS: MRI HEAD FINDINGS Brain: No abnormal enhancement within site of restricted diffusion involving the superior right cerebellum and vermis. No midline shift, ventriculomegaly or extra-axial fluid collection. No mass lesion. Vascular: Normal flow voids. Skull and upper cervical spine: Normal marrow signal. Sinuses/Orbits: Normal orbits. Aerated sinuses and mastoid air cells. Other: None. MRA HEAD FINDINGS Anterior circulation: Normal caliber patent appearance of the internal carotid, middle and anterior cerebral arteries. No significant stenosis, proximal occlusion, aneurysm, or vascular malformation. Posterior circulation: Dominant right vertebral artery. Proximally patent right PICA. Short-segment defects involving the proximal left PICA may reflect high-grade narrowing. Dominant right AICA. Patent normal caliber basilar artery. Patent normal caliber bilateral posterior cerebral arteries. The bilateral superior cerebellar arteries are proximally patent, diminutive on the left. No  significant stenosis, proximal occlusion, aneurysm, or vascular malformation. Venous sinuses: Patent Anatomic variants: None of significance. MRA NECK FINDINGS There is no high-grade narrowing or focal aneurysm involving the bilateral carotid arteries. No evidence of dissection. The bilateral vertebral arteries are patent and demonstrate antegrade flow. Dominant right vertebral artery. No evidence of high-grade narrowing or focal aneurysm. IMPRESSION: No abnormal enhancement. This is suggestive of acute infarct involving the superior right cerebellum and vermis. High-grade short-segment narrowing involving the proximal left PICA. Otherwise unremarkable MRA head. Normal MRA neck. Electronically Signed   By: Stana Bunting M.D.   On: 03/17/2020 09:13   MR CERVICAL SPINE W WO CONTRAST  Result Date: 03/17/2020 CLINICAL DATA:  Demyelinating disease EXAM: MRI CERVICAL SPINE WITHOUT AND WITH CONTRAST TECHNIQUE: Multiplanar and multiecho pulse sequences of the cervical spine, to include the craniocervical junction and cervicothoracic junction, were obtained without and with intravenous contrast. CONTRAST:  9mL GADAVIST GADOBUTROL 1 MMOL/ML IV SOLN COMPARISON:  03/17/2020 MRI head without and with contrast. FINDINGS: Image quality is mildly degraded by motion artifact. Alignment: Straightening of cervical lordosis. Vertebrae: Normal bone marrow signal intensity. No focal osseous lesion. No abnormal enhancement. Cord: Normal signal and morphology.  No abnormal enhancement. Posterior Fossa, vertebral arteries: Please see concurrent MRI head. Disc levels: Mild C5-6 desiccation.  Disc spaces are preserved. C2-3: No significant disc bulge, spinal canal or neural foraminal narrowing. C3-4: Small disc osteophyte complex with uncovertebral degenerative spurring. Patent spinal canal and neural foramen. C4-5: No significant disc bulge, spinal canal or neural foraminal narrowing. C5-6: No significant disc bulge, spinal canal or  neural foraminal narrowing. C6-7: No significant disc  bulge, spinal canal or neural foraminal narrowing. C7-T1: No significant disc bulge, spinal canal or neural foraminal narrowing. Paraspinal tissues: Negative. IMPRESSION: No evidence of demyelination or myelopathy. No significant spinal canal or neural foraminal narrowing. Electronically Signed   By: Stana Bunting M.D.   On: 03/17/2020 09:28   ECHOCARDIOGRAM COMPLETE BUBBLE STUDY  Result Date: 03/17/2020    ECHOCARDIOGRAM REPORT   Patient Name:   Jackson Terrell Date of Exam: 03/17/2020 Medical Rec #:  [id number]           Height:       68.0 in Accession #:    [id number]          Weight:       200.0 lb Date of Birth:  August 10, 1990           BSA:          2.044 m Patient Age:    29 years            BP:           150/102 mmHg Patient Gender: M                   HR:           90 bpm. Exam Location:  Inpatient Procedure: 2D Echo, Cardiac Doppler, Color Doppler and Saline Contrast Bubble            Study Indications:    Stroke  History:        Patient has no prior history of Echocardiogram examinations.                 Signs/Symptoms:Dizziness/Lightheadedness.  Sonographer:    Sheralyn Boatman RDCS Referring Phys: [id number] [facility] IMPRESSIONS  1. Left ventricular ejection fraction, by estimation, is 60 to 65%. The left ventricle has normal function. The left ventricle has no regional wall motion abnormalities. There is moderate left ventricular hypertrophy. Left ventricular diastolic parameters were normal.  2. Right ventricular systolic function is normal. The right ventricular size is normal.  3. Left atrial size was mildly dilated.  4. The mitral valve is normal in structure. Trivial mitral valve regurgitation. No evidence of mitral stenosis.  5. The aortic valve is normal in structure. Aortic valve regurgitation is not visualized. No aortic stenosis is present.  6. The inferior vena cava is normal in size with greater than 50% respiratory variability,  suggesting right atrial pressure of 3 mmHg. FINDINGS  Left Ventricle: Left ventricular ejection fraction, by estimation, is 60 to 65%. The left ventricle has normal function. The left ventricle has no regional wall motion abnormalities. The left ventricular internal cavity size was normal in size. There is  moderate left ventricular hypertrophy. Left ventricular diastolic parameters were normal. Right Ventricle: The right ventricular size is normal. No increase in right ventricular wall thickness. Right ventricular systolic function is normal. Left Atrium: Left atrial size was mildly dilated. Right Atrium: Right atrial size was normal in size. Pericardium: There is no evidence of pericardial effusion. Mitral Valve: The mitral valve is normal in structure. Normal mobility of the mitral valve leaflets. Trivial mitral valve regurgitation. No evidence of mitral valve stenosis. Tricuspid Valve: The tricuspid valve is normal in structure. Tricuspid valve regurgitation is not demonstrated. No evidence of tricuspid stenosis. Aortic Valve: The aortic valve is normal in structure. Aortic valve regurgitation is not visualized. No aortic stenosis is present. Pulmonic Valve: The pulmonic valve was normal in structure. Pulmonic valve regurgitation is not visualized.  No evidence of pulmonic stenosis. Aorta: The aortic root is normal in size and structure. Venous: The inferior vena cava is normal in size with greater than 50% respiratory variability, suggesting right atrial pressure of 3 mmHg. IAS/Shunts: No atrial level shunt detected by color flow Doppler. Agitated saline contrast was given intravenously to evaluate for intracardiac shunting.  LEFT VENTRICLE PLAX 2D LVIDd:         4.50 cm     Diastology LVIDs:         2.80 cm     LV e' lateral:   16.40 cm/s LV PW:         1.40 cm     LV E/e' lateral: 2.3 LV IVS:        1.60 cm     LV e' medial:    7.29 cm/s LVOT diam:     2.10 cm     LV E/e' medial:  5.2 LV SV:         62 LV SV  Index:   30 LVOT Area:     3.46 cm  LV Volumes (MOD) LV vol d, MOD A2C: 86.9 ml LV vol d, MOD A4C: 88.2 ml LV vol s, MOD A2C: 29.4 ml LV vol s, MOD A4C: 30.5 ml LV SV MOD A2C:     57.5 ml LV SV MOD A4C:     88.2 ml LV SV MOD BP:      63.6 ml RIGHT VENTRICLE             IVC RV S prime:     16.40 cm/s  IVC diam: 2.40 cm TAPSE (M-mode): 3.0 cm LEFT ATRIUM             Index       RIGHT ATRIUM           Index LA diam:        3.80 cm 1.86 cm/m  RA Area:     15.40 cm LA Vol (A2C):   34.8 ml 17.03 ml/m RA Volume:   37.10 ml  18.15 ml/m LA Vol (A4C):   35.9 ml 17.57 ml/m LA Biplane Vol: 35.6 ml 17.42 ml/m  AORTIC VALVE LVOT Vmax:   91.20 cm/s LVOT Vmean:  60.900 cm/s LVOT VTI:    0.179 m  AORTA Ao Root diam: 3.80 cm Ao Asc diam:  3.40 cm MITRAL VALVE MV Area (PHT): 2.95 cm    SHUNTS MV Decel Time: 257 msec    Systemic VTI:  0.18 m MV E velocity: 37.60 cm/s  Systemic Diam: 2.10 cm MV A velocity: 57.60 cm/s MV E/A ratio:  0.65 Charlton Haws MD Electronically signed by Charlton Haws MD Signature Date/Time: 03/17/2020/10:24:39 AM    Final     PHYSICAL EXAM Pleasant young Caucasian male not in distress. . Afebrile. Head is nontraumatic. Neck is supple without bruit.    Cardiac exam no murmur or gallop. Lungs are clear to auscultation. Distal pulses are well felt. Neurological Exam ;  Awake  Alert oriented x 3. Normal speech and language.eye movements full without nystagmus but mild saccadic dysmetria on right lateral gaze..fundi were not visualized. Vision acuity and fields appear normal. Hearing is normal. Palatal movements are normal. Face symmetric. Tongue midline. Normal strength, tone, reflexes and coordination. Normal sensation. Gait deferred.  ASSESSMENT/PLAN Mr. Jackson Terrell is a 29 y.o. male withPMH significant for scoliosis, sertraline who presents with diaphoresis, nausea/vomiting. He was noted to have dilated pupils, hyperreflexia and increased tone in the  ED. Given his presentation, neurology was  consulted to assess for potential serotonin syndrome. MRI showed cerebellar CVA.  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 Likely an acute infarct however cannot exclude demyelination. Consider postcontrast imaging for further evaluation.  MRA  No abnormal enhancement. This is suggestive of acute infarct involving the superior right cerebellum and vermis; normal MRA neck  Carotid Doppler  pending  2D Echo 1. Left ventricular ejection fraction, by estimation, is 60 to 65%. The  left ventricle has normal function. The left ventricle has no regional  wall motion abnormalities. There is moderate left ventricular hypertrophy.  Left ventricular diastolic  parameters were normal.  2. Right ventricular systolic function is normal. The right ventricular  size is normal.  3. Left atrial size was mildly dilated.  4. The mitral valve is normal in structure. Trivial mitral valve  regurgitation. No evidence of mitral stenosis.  5. The aortic valve is normal in structure. Aortic valve regurgitation is  not visualized. No aortic stenosis is present.  6. The inferior vena cava is normal in size with greater than 50%  respiratory variability, suggesting right atrial pressure of 3 mmHg.  TEE: pending  hypercoagulable studies: pending  LDL 96  HgbA1c 5.4  VTE prophylaxis - none    Diet   Diet regular Room service appropriate? Yes; Fluid consistency: Thin     No antithrombotic prior to admission, now on aspirin 325 mg daily.   Therapy recommendations:  pending  Disposition:  pending  Hypertension  Home meds:  none  Stable . Permissive hypertension (OK if < 220/120) but gradually normalize in 5-7 days . Long-term BP goal normotensive  Hyperlipidemia  Home meds: none  LDL 96, goal < 70  Add atorvastatin 80 mg  Continue statin at discharge  Diabetes type II Controlled  Home  meds:  none  HgbA1c 5.4, goal < 7.0  CBGs Recent Labs    03/15/20 1628  GLUCAP 113*     Other Stroke Risk Factors   ETOH use,  advised to drink no more than 2 drink(s) a day  Obesity, Body mass index is 30.41 kg/m., recommend weight loss, diet and exercise as appropriate     Hospital day # 1  Valentina Lucks, MSN, NP-C [facility] [phone] I have personally obtained history,examined this patient, reviewed notes, independently viewed imaging studies, participated in medical decision making and plan of care.ROS completed by me personally and pertinent positives fully documented  I have made any additions or clarifications directly to the above note. Agree with note above.  He presented with sudden onset of nausea, dizziness and gait imbalance likely due to right superior cerebellar embolic infarct etiology to be determined.  Recommend continue ongoing stroke work-up.  Check transcranial bubble study for PFO and transesophageal echocardiogram for cardiac source of embolism tomorrow.  Check ANA panel, anticardiolipin antibodies, 2D echo.  Aspirin Plavix for 3 weeks followed by aspirin alone.  Long discussion the patient and answered questions.  Discussed with Dr. Sharolyn Douglas.  Greater than 50% time during this 35-minute visit was spent on counseling and coordination of care about embolic stroke and discussion about evaluation and prevention and answering questions Delia Heady, MD Medical Director [facility] Pager: [phone] 03/17/2020 4:11 PM   To contact Stroke Continuity provider, please refer to [email or web address]. After hours, contact General Neurology

## 2020-03-17 NOTE — Plan of Care (Signed)
  Problem: Coping: Goal: Will verbalize positive feelings about self Outcome: Progressing   

## 2020-03-17 NOTE — Progress Notes (Signed)
Report given to Lauryn on [facility] . Pt updated on the transfer

## 2020-03-17 NOTE — Evaluation (Addendum)
Physical Therapy Evaluation Patient Details Name: Jackson Terrell MRN: [id number] DOB: Nov 08, 1990 Today's Date: 03/17/2020   History of Present Illness  The pt is a 29 yo male presenting with c/o nausea and vomiting, and noted to have diffuse hyperreflexia. 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    Clinical Impression  Pt in bed upon arrival of PT, agreeable to evaluation at this time. 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 The pt now presents with limitations in functional mobility, activity tolerance, coordination, and dynamic stability due to above dx, and will continue to benefit from skilled PT to address these deficits. The pt was able to complete bed mobility without assist, but required min/modA to complete out of bed transfers and to steady in standing positions. He was unable to complete standing marching task without significant UE support to maintain his balance, and was limited to 3 ft lateral stepping to recliner at this time. The pt is a good candidate for intensive therapies due to his motivation, family support, and prior level of independence.   VITALS:  - supine - BP: 144/100 (113); HR: 82bpm - sitting EOB - BP: 150/104 (118) - standing - BP: 157/111 (126); HR: 86bpm - sitting in recliner - 158/108 (121); HR: 82bpm     Follow Up Recommendations CIR (vs HHPT pending progression)    Equipment Recommendations   (defer to post acute)    Recommendations for Other Services Rehab consult     Precautions / Restrictions Precautions Precautions: Fall Restrictions Weight Bearing Restrictions: No Other Position/Activity Restrictions: permissive HTN <220/<110 for 24 hours (start 8:00 AM 9/2)      Mobility  Bed Mobility Overal bed mobility: Needs Assistance Bed Mobility: Supine to Sit     Supine to sit:  Supervision     General bed mobility comments: supervision with extra time, no physical assist needed  Transfers Overall transfer level: Needs assistance Equipment used: 1 person hand held assist Transfers: Sit to/from [employer] Sit to Stand: Min assist Stand pivot transfers: Min assist       General transfer comment: minA to power up and steady in standing. minA to steady with lateral steps  Ambulation/Gait Ambulation/Gait assistance: Min assist Gait Distance (Feet): 3 Feet Assistive device: 1 person hand held assist Gait Pattern/deviations: Step-to pattern;Shuffle   Gait velocity interpretation: <1.31 ft/sec, indicative of household ambulator General Gait Details: small lateral steps to recliner, minA to steady. standing marches with modA through HHA   Modified Rankin (Stroke Patients Only) Modified Rankin (Stroke Patients Only) Pre-Morbid Rankin Score: No symptoms Modified Rankin: Moderately severe disability     Balance Overall balance assessment: Needs assistance Sitting-balance support: Single extremity supported;Feet supported Sitting balance-Leahy Scale: Fair     Standing balance support: Single extremity supported;During functional activity Standing balance-Leahy Scale: Poor Standing balance comment: modA to steady with standing marches               High Level Balance Comments: pt unable to wt shift and maintain balance to perform standing march without modA             Pertinent Vitals/Pain Pain Assessment: 0-10 Pain Score: 2  Pain Location: headache Pain Descriptors / Indicators: Aching Pain Intervention(s): Limited activity within patient's tolerance;Monitored during session;Repositioned    Home Living Family/patient expects to be discharged to:: Private residence Living Arrangements: Alone  Available Help at Discharge: Family;Available 24 hours/day (mom visiting from [place], can stay) Type of Home: Apartment Home  Access: Stairs to enter   Entrance Stairs-Number of Steps: pt reports multiple small sets of stairs from parking to apt entrance (totalling ~20 steps), then elevator to 3rd floor apt Home Layout: One level Home Equipment: None      Prior Function Level of Independence: Independent         Comments: independent, 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   Dominant Hand: Right    Extremity/Trunk Assessment   Upper Extremity Assessment Upper Extremity Assessment: Defer to OT evaluation    Lower Extremity Assessment Lower Extremity Assessment: Overall WFL for tasks assessed (no noteable difference in strength, symmetric heel-to-shin, pt reports no change in sensation)    Cervical / Trunk Assessment Cervical / Trunk Assessment: Normal  Communication   Communication: No difficulties  Cognition Arousal/Alertness: Lethargic Behavior During Therapy: WFL for tasks assessed/performed;Flat affect Overall Cognitive Status: Within Functional Limits for tasks assessed                                 General Comments: pt slightly lethargic (eyes open on command but generally closing when not directly cued). but agreeable throughout, conversation [facility]      General Comments General comments (skin integrity, edema, etc.): BP elevated with mobility, but under permissive HTN (<220/110) until standing BP of 157/111. Pt returned to seated rest at that time. RN notified        Assessment/Plan    PT Assessment Patient needs continued PT services  PT Problem List Decreased mobility;Decreased coordination;Decreased activity tolerance;Decreased balance       PT Treatment Interventions DME instruction;Therapeutic exercise;Gait training;Stair training;Functional mobility training;Therapeutic activities;Patient/family education;Balance training;Neuromuscular re-education    PT Goals (Current goals can be found in the Care Plan section)  Acute Rehab PT Goals Patient  Stated Goal: return to independence PT Goal Formulation: With patient Time For Goal Achievement: 03/31/20 Potential to Achieve Goals: Good    Frequency Min 4X/week   Barriers to discharge Decreased caregiver support multiple stairs and typically pt lives alone (mother is present to assist at this time)       AM-PAC PT "6 Clicks" Mobility  Outcome Measure Help needed turning from your back to your side while in a flat bed without using bedrails?: None Help needed moving from lying on your back to sitting on the side of a flat bed without using bedrails?: A Little Help needed moving to and from a bed to a chair (including a wheelchair)?: A Little Help needed standing up from a chair using your arms (e.g., wheelchair or bedside chair)?: A Little Help needed to walk in hospital room?: A Lot Help needed climbing 3-5 steps with a railing? : A Lot 6 Click Score: 17    End of Session Equipment Utilized During Treatment: Gait belt Activity Tolerance: Patient tolerated treatment well;Patient limited by fatigue;Other (comment) (elevated BP over permissive HTN) Patient left: in chair;with nursing/sitter in room;with call bell/phone within reach Nurse Communication: Mobility status (pt's mother requesting to speak with MD) PT Visit Diagnosis: Unsteadiness on feet (R26.81);Difficulty in walking, not elsewhere classified (R26.2)    Time: 7846-9629 PT Time Calculation (min) (ACUTE ONLY): 35 min   Charges:   PT Evaluation $PT Eval Moderate Complexity: 1 Mod PT Treatments $Gait Training: 8-22 mins        Dezaray Shibuya  Lynnell Chad, DPT   Acute Rehabilitation Department Pager #: [phone]  Gaetana Michaelis 03/17/2020, 3:50 PM

## 2020-03-17 NOTE — Progress Notes (Signed)
    [facility] has been requested to perform a transesophageal echocardiogram on 03/18/20 for stroke.  After careful review of history and examination, the risks and benefits of transesophageal echocardiogram have been explained including risks of esophageal damage, perforation (1:10,000 risk), bleeding, pharyngeal hematoma as well as other potential complications associated with conscious sedation including aspiration, arrhythmia, respiratory failure and death. Alternatives to treatment were discussed, questions were answered. Patient is willing to proceed.   TEE scheduled for 03/18/20 at 9am with Dr. Eden Emms.   Judy Pimple, PA-C 03/17/2020 3:56 PM

## 2020-03-17 NOTE — Progress Notes (Signed)
Brief Neuro Update:  I ordered MRI Brain and C spine urgently for reported occipital headache last night and urinary retention with bladder showing . Given his diffuse hyperreflexia on exam since presentation, concern was either brainstem process or Cervical myelopathy.  MRI Brain this morning demonstrates R cerebellum and vermis stroke. I ordered stroke workup with MR Angio head and Neck and MR cervical spine with contrast. He is still in the MR Scanner and the MRI techs will get the scans done. Also discussed with the radiology team.  Recs: - MR Angio head and Neck - TTE - Lipid panel - ordered - Statin - will start Atorvastatin if LDL > 70. - A1C - ordered  - Antithrombotic - Ordered Aspirin. - DVT ppx - SQ Heparin - SBP goal - permissive hypertension first 24 h < 220/110. Held home meds.  - Telemetry monitoring for arrythmia- ordered - Swallow screen - ordered - Stroke education - ordered  - PT/OT/SLP consult - Will have Stroke team round on him today.   Erick Blinks [facility] Pager Number [phone]

## 2020-03-17 NOTE — Evaluation (Signed)
Speech Language Pathology Evaluation Patient Details Name: Jackson Terrell MRN: [id number] DOB: 12/04/1990 Today's Date: 03/17/2020 Time: 2035-5974 SLP Time Calculation (min) (ACUTE ONLY): 20 min  Problem List:  Patient Active Problem List   Diagnosis Date Noted  . 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 Depression 03/15/2020   Past Medical History:  Past Medical History:  Diagnosis Date  . Scoliosis    mild   Past Surgical History: No past surgical history on file. HPI:  29yo male admitted 03/15/20 with n/v, dizziness. PMH: depression. MRI = acute infarct involving the superior right cerebellum and vermis.   Assessment / Plan / Recommendation Clinical Impression  Pt seen at bedside for assessment of cognitive linguistic function. The CCAS Scale (Cerebellar Cognitive Affective Schmahmann Syndrome Scale) was administered. Pt passed all sections including semantic and phonemic fluency, category switching, immediate and delayed recall, digit span forward and backward, cube drawing task, similarities and go/no go task. Pt does not report obvious changes in cognition or communication, and none are observed. No further ST intervention is recommended at this time. Pt was encouraged to notify PCP if difficulties arise upon return to work and normal routines. RN informed.    SLP Assessment  SLP Recommendation/Assessment: Patient does not need any further Speech Language Pathology Services unless difficulties arise upon return to work and normal routines  SLP Visit Diagnosis: Cognitive communication deficit (R41.841)    Follow Up Recommendations  Outpatient SLP (if issues arise)       SLP Evaluation Cognition  Overall Cognitive Status: Within Functional Limits for tasks assessed Arousal/Alertness: Awake/alert Orientation Level: Oriented X4       Comprehension  Auditory Comprehension Overall Auditory Comprehension: Appears within functional limits for tasks assessed    Expression  Expression Primary Mode of Expression: Verbal Verbal Expression Overall Verbal Expression: Appears within functional limits for tasks assessed Written Expression Dominant Hand: Right   Oral / Motor  Oral Motor/Sensory Function Overall Oral Motor/Sensory Function: Within functional limits Motor Speech Overall Motor Speech: Appears within functional limits for tasks assessed   GO                   Alania Overholt B. Murvin Natal, [facility], CCC-SLP Speech Language Pathologist Office: [phone]  Leigh Aurora 03/17/2020, 11:21 AM

## 2020-03-17 NOTE — Progress Notes (Signed)
  Echocardiogram 2D Echocardiogram has been performed.  Janalyn Harder 03/17/2020, 10:10 AM

## 2020-03-17 NOTE — Progress Notes (Signed)
Patient c/o headache and  unable to urinate. He feels the pressure but unable to do so. Bladder scanned performed it showed 465cc urine. On call night provider made aware MD Zierle-Ghosh. Orders given. In and out done, drained 1100cc of urine. Patient verbalize relief.

## 2020-03-17 NOTE — Progress Notes (Addendum)
Patient arrived to [phone]. Alert and oriented x4. Denies any pain. Ambulated to bathroom 1 assist with rolling walker. POC provided to patient. Bed in lowest position with bed alarm on. Call bell within reach. CCMD notified

## 2020-03-17 NOTE — Progress Notes (Signed)
PROGRESS NOTE  Jackson Terrell JQZ:[id number] DOB: 02-11-1991 DOA: 03/15/2020 PCP: [facility]  HPI/Recap of past 24 hours: HPI from Dr Ileene Rubens is a 29 y.o. male with medical history significant for depression who presents with concerns of nausea, vomiting and dizziness. Denies any diarrhea abdominal pain. Reports that he has been taking his sertraline at 300 mg for the past year since he did not feel like the lower dose was working. Denies any suicidal ideation but that he is just "stressed." Denies any tobacco use. Has occasional alcohol use.  States he has not used marijuana in over a year. ED Course: He was diaphoretic, mildly tachycardic and hypertensive up to 170s.  He was noted to have sustained hyperreflexia of the patella reflex.  Neurology was consulted by ED physician for concerns of serotonin syndrome. Neurology has evaluated bedside and recommends as needed Ativan and hospitalist admission for serotonin syndrome.    Overnight, patient complained of headache, was also noted to have acute urinary retention. MRI brain done showed acute stroke. This a.m., patient continued to have headaches, denies any other new complaints.   Assessment/Plan:  Principal Problem:   Serotonin syndrome Active Problems:   Depression  Serotonin syndrome Likely from SSRI overmedication (takes 300 mg of sertraline daily) Hold sertraline, avoid all teratogenic medication, avoid Zofran Continue as needed Ativan for any hyperthermia (will not be relieved with Tylenol), hypertension SBP greater than 150, tachycardia heart rate greater than 100, agitation Esmolol could be considered if not relieved with Ativan UDS unremarkable Neurology on board, appreciate recs S/P IV fluids Continue telemetry  Right cerebellum infarct Possible embolic source MRI brain showed above MRA head suggestive of acute infarct, normal MRA neck MR cervical spine showed no  evidence of demyelination all myelopathy, no significant spinal canal or neural foraminal narrowing LDL 96, A1c 5.4 Hypercoagulable studies pending TTE with EF of 60 to 65%, no regional wall motion abnormality, negative for intracardiac shunting TEE scheduled for 03/18/2020 by cardiology Neurology consulted Continue aspirin, Lipitor  Obesity Lifestyle modification advised  Depression Psychiatry consulted, recommend outpatient follow-up and holding sertraline If not seen in the outpatient, may resume sertraline at 100 mg daily on 03/21/2020 Transition of care consulted for outpatient psychiatry follow-up      Malnutrition Type:      Malnutrition Characteristics:      Nutrition Interventions:       Estimated body mass index is 30.41 kg/m as calculated from the following:   Height as of this encounter: 5\' 8"  (1.727 m).   Weight as of this encounter: 90.7 kg.     Code Status: Full  Family Communication: Discussed with patient  Disposition Plan: Likely home Vs CIR      Consultants:  Neurology  Cardiology  Procedures:  None  Antimicrobials:  None  DVT prophylaxis: Lovenox   Objective: Vitals:   03/17/20 1050 03/17/20 1208 03/17/20 1355 03/17/20 1550  BP: (!) 148/91 (!) 130/99 (!) 143/99 (!) 153/108  Pulse:  90 97 88  Resp: 12 15 (!) 24 17  Temp: 98.6 F (37 C) 98.5 F (36.9 C) 97.7 F (36.5 C)   TempSrc: Oral Oral Oral   SpO2: 92% 95% 94% 98%  Weight:      Height:        Intake/Output Summary (Last 24 hours) at 03/17/2020 1552 Last data filed at 03/17/2020 1000 Gross per 24 hour  Intake 1250.89 ml  Output 2200 ml  Net -949.11 ml  Filed Weights   03/15/20 1627 03/16/20 1427  Weight: 90.7 kg 90.7 kg    Exam:  General: NAD, sleepy but easily arousable  Cardiovascular: S1, S2 present  Respiratory: CTAB  Abdomen: Soft, nontender, nondistended, bowel sounds present  Musculoskeletal: No bilateral pedal edema noted  Skin:  Normal  Psychiatry: Normal mood  Neurology: No obvious focal neurologic deficits noted    Data Reviewed: CBC: Recent Labs  Lab 03/15/20 1700 03/16/20 0509 03/17/20 0559  WBC 13.5* 9.2 8.3  NEUTROABS 10.0*  --  5.5  HGB 16.3 14.3 13.8  HCT 49.6 44.7 42.5  MCV 87.3 88.7 87.3  PLT 270 246 232   Basic Metabolic Panel: Recent Labs  Lab 03/15/20 1700 03/16/20 0509 03/17/20 0559  NA 141 140 138  K 4.9 3.8 3.4*  CL 106 106 104  CO2 21* 24 25  GLUCOSE 112* 110* 93  BUN 9 5* 6  CREATININE 0.98 0.83 0.70  CALCIUM 9.4 9.1 8.8*  MG 2.4  --   --    GFR: Estimated Creatinine Clearance: 147.6 mL/min (by C-G formula based on SCr of 0.7 mg/dL). Liver Function Tests: Recent Labs  Lab 03/15/20 1700  AST 70*  ALT 63*  ALKPHOS 74  BILITOT 1.5*  PROT 7.8  ALBUMIN 4.7   Recent Labs  Lab 03/15/20 1700  LIPASE 28   No results for input(s): AMMONIA in the last 168 hours. Coagulation Profile: No results for input(s): INR, PROTIME in the last 168 hours. Cardiac Enzymes: Recent Labs  Lab 03/15/20 1700  CKTOTAL 152   BNP (last 3 results) No results for input(s): PROBNP in the last 8760 hours. HbA1C: Recent Labs    03/17/20 0559  HGBA1C 5.4   CBG: Recent Labs  Lab 03/15/20 1628  GLUCAP 113*   Lipid Profile: Recent Labs    03/17/20 0559  CHOL 157  HDL 44  LDLCALC 96  TRIG 85  CHOLHDL 3.6   Thyroid Function Tests: Recent Labs    03/15/20 1700  TSH 1.125  FREET4 0.85  T3FREE 2.9   Anemia Panel: No results for input(s): VITAMINB12, FOLATE, FERRITIN, TIBC, IRON, RETICCTPCT in the last 72 hours. Urine analysis:    Component Value Date/Time   COLORURINE YELLOW 03/16/2020 0156   APPEARANCEUR HAZY (A) 03/16/2020 0156   LABSPEC 1.015 03/16/2020 0156   PHURINE 7.0 03/16/2020 0156   GLUCOSEU NEGATIVE 03/16/2020 0156   HGBUR NEGATIVE 03/16/2020 0156   BILIRUBINUR NEGATIVE 03/16/2020 0156   BILIRUBINUR neg 11/10/2013 1021   KETONESUR NEGATIVE 03/16/2020  0156   PROTEINUR NEGATIVE 03/16/2020 0156   UROBILINOGEN negative 11/10/2013 1021   NITRITE NEGATIVE 03/16/2020 0156   LEUKOCYTESUR NEGATIVE 03/16/2020 0156   Sepsis Labs: (procalcitonin:4,lacticidven:4)  ) Recent Results (from the past 240 hour(s))  SARS Coronavirus 2 by RT PCR (hospital order, performed in [facility] hospital lab) Nasopharyngeal Nasopharyngeal Swab     Status: None   Collection Time: 03/15/20  5:20 PM   Specimen: Nasopharyngeal Swab  Result Value Ref Range Status   SARS Coronavirus 2 NEGATIVE NEGATIVE Final    Comment: (NOTE) SARS-CoV-2 target nucleic acids are NOT DETECTED.  The SARS-CoV-2 RNA is generally detectable in upper and lower respiratory specimens during the acute phase of infection. The lowest concentration of SARS-CoV-2 viral copies this assay can detect is 250 copies / mL. A negative result does not preclude SARS-CoV-2 infection and should not be used as the sole basis for treatment or other patient management decisions.  A negative result  may occur with improper specimen collection / handling, submission of specimen other than nasopharyngeal swab, presence of viral mutation(s) within the areas targeted by this assay, and inadequate number of viral copies (<250 copies / mL). A negative result must be combined with clinical observations, patient history, and epidemiological information.  Fact Sheet for Patients:   BoilerBrush.com.cy  Fact Sheet for Healthcare Providers: https://pope.com/  This test is not yet approved or  cleared by the [place] FDA and has been authorized for detection and/or diagnosis of SARS-CoV-2 by FDA under an Emergency Use Authorization (EUA).  This EUA will remain in effect (meaning this test can be used) for the duration of the COVID-19 declaration under Section 564(b)(1) of the Act, 21 U.S.C. section 360bbb-3(b)(1), unless the authorization is terminated  or revoked sooner.  Performed at [facility], [address] [place], [place] [address]   Urine culture     Status: None   Collection Time: 03/16/20  1:36 AM   Specimen: Urine, Random  Result Value Ref Range Status   Specimen Description URINE, RANDOM  Final   Special Requests NONE  Final   Culture   Final    NO GROWTH Performed at [facility], [address], [place], [place] [address]    Report Status 03/16/2020 FINAL  Final      Studies: MR ANGIO HEAD WO CONTRAST  Result Date: 03/17/2020 CLINICAL DATA:  Neuro deficit EXAM: MRI HEAD WITH CONTRAST MRA HEAD WITHOUT CONTRAST MRA NECK WITHOUT AND WITH CONTRAST TECHNIQUE: Multiplanar, multiecho pulse sequences of the brain and surrounding structures were obtained without intravenous contrast. Angiographic images of the head were obtained using MRA technique without contrast. Angiographic images of the neck were obtained using MRA technique with and without intravenous contrast. Carotid stenosis measurements (when applicable) are obtained utilizing NASCET criteria, using the distal internal carotid diameter as the denominator. CONTRAST:  9mL GADAVIST GADOBUTROL 1 MMOL/ML IV SOLN COMPARISON:  Prior same day MRI head. FINDINGS: MRI HEAD FINDINGS Brain: No abnormal enhancement within site of restricted diffusion involving the superior right cerebellum and vermis. No midline shift, ventriculomegaly or extra-axial fluid collection. No mass lesion. Vascular: Normal flow voids. Skull and upper cervical spine: Normal marrow signal. Sinuses/Orbits: Normal orbits. Aerated sinuses and mastoid air cells. Other: None. MRA HEAD FINDINGS Anterior circulation: Normal caliber patent appearance of the internal carotid, middle and anterior cerebral arteries. No significant stenosis, proximal occlusion, aneurysm, or vascular malformation. Posterior circulation: Dominant right vertebral artery. Proximally patent right PICA. Short-segment defects  involving the proximal left PICA may reflect high-grade narrowing. Dominant right AICA. Patent normal caliber basilar artery. Patent normal caliber bilateral posterior cerebral arteries. The bilateral superior cerebellar arteries are proximally patent, diminutive on the left. No significant stenosis, proximal occlusion, aneurysm, or vascular malformation. Venous sinuses: Patent Anatomic variants: None of significance. MRA NECK FINDINGS There is no high-grade narrowing or focal aneurysm involving the bilateral carotid arteries. No evidence of dissection. The bilateral vertebral arteries are patent and demonstrate antegrade flow. Dominant right vertebral artery. No evidence of high-grade narrowing or focal aneurysm. IMPRESSION: No abnormal enhancement. This is suggestive of acute infarct involving the superior right cerebellum and vermis. High-grade short-segment narrowing involving the proximal left PICA. Otherwise unremarkable MRA head. Normal MRA neck. Electronically Signed   By: Stana Bunting M.D.   On: 03/17/2020 09:13   MR ANGIO NECK W WO CONTRAST  Result Date: 03/17/2020 CLINICAL DATA:  Neuro deficit EXAM: MRI HEAD WITH CONTRAST MRA HEAD  WITHOUT CONTRAST MRA NECK WITHOUT AND WITH CONTRAST TECHNIQUE: Multiplanar, multiecho pulse sequences of the brain and surrounding structures were obtained without intravenous contrast. Angiographic images of the head were obtained using MRA technique without contrast. Angiographic images of the neck were obtained using MRA technique with and without intravenous contrast. Carotid stenosis measurements (when applicable) are obtained utilizing NASCET criteria, using the distal internal carotid diameter as the denominator. CONTRAST:  9mL GADAVIST GADOBUTROL 1 MMOL/ML IV SOLN COMPARISON:  Prior same day MRI head. FINDINGS: MRI HEAD FINDINGS Brain: No abnormal enhancement within site of restricted diffusion involving the superior right cerebellum and vermis. No midline  shift, ventriculomegaly or extra-axial fluid collection. No mass lesion. Vascular: Normal flow voids. Skull and upper cervical spine: Normal marrow signal. Sinuses/Orbits: Normal orbits. Aerated sinuses and mastoid air cells. Other: None. MRA HEAD FINDINGS Anterior circulation: Normal caliber patent appearance of the internal carotid, middle and anterior cerebral arteries. No significant stenosis, proximal occlusion, aneurysm, or vascular malformation. Posterior circulation: Dominant right vertebral artery. Proximally patent right PICA. Short-segment defects involving the proximal left PICA may reflect high-grade narrowing. Dominant right AICA. Patent normal caliber basilar artery. Patent normal caliber bilateral posterior cerebral arteries. The bilateral superior cerebellar arteries are proximally patent, diminutive on the left. No significant stenosis, proximal occlusion, aneurysm, or vascular malformation. Venous sinuses: Patent Anatomic variants: None of significance. MRA NECK FINDINGS There is no high-grade narrowing or focal aneurysm involving the bilateral carotid arteries. No evidence of dissection. The bilateral vertebral arteries are patent and demonstrate antegrade flow. Dominant right vertebral artery. No evidence of high-grade narrowing or focal aneurysm. IMPRESSION: No abnormal enhancement. This is suggestive of acute infarct involving the superior right cerebellum and vermis. High-grade short-segment narrowing involving the proximal left PICA. Otherwise unremarkable MRA head. Normal MRA neck. Electronically Signed   By: Stana Buntinghikanele  Emekauwa M.D.   On: 03/17/2020 09:13   MR BRAIN WO CONTRAST  Result Date: 03/17/2020 CLINICAL DATA:  Neuro deficit EXAM: MRI HEAD WITHOUT CONTRAST TECHNIQUE: Multiplanar, multiecho pulse sequences of the brain and surrounding structures were obtained without intravenous contrast. COMPARISON:  None. FINDINGS: Brain: Focal restricted diffusion involving the superior right  cerebellum and vermis with associated small foci of SWI signal dropout. No midline shift, ventriculomegaly or extra-axial fluid collection. No mass lesion. Vascular: Normal flow voids. Skull and upper cervical spine: Normal marrow signal. Sinuses/Orbits: Normal orbits. Clear paranasal sinuses. No mastoid effusion. Other: None. IMPRESSION: Restricted diffusion involving the superior right cerebellum and vermis. Likely an acute infarct however cannot exclude demyelination. Consider postcontrast imaging for further evaluation. These results were called by telephone at the time of interpretation on 03/17/2020 at 7:30 am to provider [facility] KHALIQDINA , who verbally acknowledged these results. Electronically Signed   By: Stana Buntinghikanele  Emekauwa M.D.   On: 03/17/2020 07:33   MR BRAIN W CONTRAST  Result Date: 03/17/2020 CLINICAL DATA:  Neuro deficit EXAM: MRI HEAD WITH CONTRAST MRA HEAD WITHOUT CONTRAST MRA NECK WITHOUT AND WITH CONTRAST TECHNIQUE: Multiplanar, multiecho pulse sequences of the brain and surrounding structures were obtained without intravenous contrast. Angiographic images of the head were obtained using MRA technique without contrast. Angiographic images of the neck were obtained using MRA technique with and without intravenous contrast. Carotid stenosis measurements (when applicable) are obtained utilizing NASCET criteria, using the distal internal carotid diameter as the denominator. CONTRAST:  9mL GADAVIST GADOBUTROL 1 MMOL/ML IV SOLN COMPARISON:  Prior same day MRI head. FINDINGS: MRI HEAD FINDINGS Brain: No abnormal enhancement within site of restricted diffusion  involving the superior right cerebellum and vermis. No midline shift, ventriculomegaly or extra-axial fluid collection. No mass lesion. Vascular: Normal flow voids. Skull and upper cervical spine: Normal marrow signal. Sinuses/Orbits: Normal orbits. Aerated sinuses and mastoid air cells. Other: None. MRA HEAD FINDINGS Anterior circulation: Normal  caliber patent appearance of the internal carotid, middle and anterior cerebral arteries. No significant stenosis, proximal occlusion, aneurysm, or vascular malformation. Posterior circulation: Dominant right vertebral artery. Proximally patent right PICA. Short-segment defects involving the proximal left PICA may reflect high-grade narrowing. Dominant right AICA. Patent normal caliber basilar artery. Patent normal caliber bilateral posterior cerebral arteries. The bilateral superior cerebellar arteries are proximally patent, diminutive on the left. No significant stenosis, proximal occlusion, aneurysm, or vascular malformation. Venous sinuses: Patent Anatomic variants: None of significance. MRA NECK FINDINGS There is no high-grade narrowing or focal aneurysm involving the bilateral carotid arteries. No evidence of dissection. The bilateral vertebral arteries are patent and demonstrate antegrade flow. Dominant right vertebral artery. No evidence of high-grade narrowing or focal aneurysm. IMPRESSION: No abnormal enhancement. This is suggestive of acute infarct involving the superior right cerebellum and vermis. High-grade short-segment narrowing involving the proximal left PICA. Otherwise unremarkable MRA head. Normal MRA neck. Electronically Signed   By: Stana Bunting M.D.   On: 03/17/2020 09:13   MR CERVICAL SPINE W WO CONTRAST  Result Date: 03/17/2020 CLINICAL DATA:  Demyelinating disease EXAM: MRI CERVICAL SPINE WITHOUT AND WITH CONTRAST TECHNIQUE: Multiplanar and multiecho pulse sequences of the cervical spine, to include the craniocervical junction and cervicothoracic junction, were obtained without and with intravenous contrast. CONTRAST:  9mL GADAVIST GADOBUTROL 1 MMOL/ML IV SOLN COMPARISON:  03/17/2020 MRI head without and with contrast. FINDINGS: Image quality is mildly degraded by motion artifact. Alignment: Straightening of cervical lordosis. Vertebrae: Normal bone marrow signal intensity. No  focal osseous lesion. No abnormal enhancement. Cord: Normal signal and morphology.  No abnormal enhancement. Posterior Fossa, vertebral arteries: Please see concurrent MRI head. Disc levels: Mild C5-6 desiccation.  Disc spaces are preserved. C2-3: No significant disc bulge, spinal canal or neural foraminal narrowing. C3-4: Small disc osteophyte complex with uncovertebral degenerative spurring. Patent spinal canal and neural foramen. C4-5: No significant disc bulge, spinal canal or neural foraminal narrowing. C5-6: No significant disc bulge, spinal canal or neural foraminal narrowing. C6-7: No significant disc bulge, spinal canal or neural foraminal narrowing. C7-T1: No significant disc bulge, spinal canal or neural foraminal narrowing. Paraspinal tissues: Negative. IMPRESSION: No evidence of demyelination or myelopathy. No significant spinal canal or neural foraminal narrowing. Electronically Signed   By: Stana Bunting M.D.   On: 03/17/2020 09:28   VAS [place] TRANSCRANIAL DOPPLER W BUBBLES  Result Date: 03/17/2020  Transcranial Doppler with Bubble Indications: Stroke. Comparison Study: No prior studies. Performing Technologist: Chanda Busing RVT  Examination Guidelines: A complete evaluation includes B-mode imaging, spectral Doppler, color Doppler, and power Doppler as needed of all accessible portions of each vessel. Bilateral testing is considered an integral part of a complete examination. Limited examinations for reoccurring indications may be performed as noted.  Summary:  A vascular evaluation was performed. The right middle cerebral artery was studied. An IV was inserted into the patient's left Cephalic vein. Verbal informed consent was obtained.  No HITS detected during rest or valsalva maneuver. Negative for PFO. *See table(s) above for TCD measurements and observations.    Preliminary    ECHOCARDIOGRAM COMPLETE BUBBLE STUDY  Result Date: 03/17/2020    ECHOCARDIOGRAM REPORT   Patient Name:  Jackson Terrell Date of Exam: 03/17/2020 Medical Rec #:  [id number]           Height:       68.0 in Accession #:    [id number]          Weight:       200.0 lb Date of Birth:  Feb 18, 1991           BSA:          2.044 m Patient Age:    29 years            BP:           150/102 mmHg Patient Gender: M                   HR:           90 bpm. Exam Location:  Inpatient Procedure: 2D Echo, Cardiac Doppler, Color Doppler and Saline Contrast Bubble            Study Indications:    Stroke  History:        Patient has no prior history of Echocardiogram examinations.                 Signs/Symptoms:Dizziness/Lightheadedness.  Sonographer:    Sheralyn Boatman RDCS Referring Phys: [id number] [facility] IMPRESSIONS  1. Left ventricular ejection fraction, by estimation, is 60 to 65%. The left ventricle has normal function. The left ventricle has no regional wall motion abnormalities. There is moderate left ventricular hypertrophy. Left ventricular diastolic parameters were normal.  2. Right ventricular systolic function is normal. The right ventricular size is normal.  3. Left atrial size was mildly dilated.  4. The mitral valve is normal in structure. Trivial mitral valve regurgitation. No evidence of mitral stenosis.  5. The aortic valve is normal in structure. Aortic valve regurgitation is not visualized. No aortic stenosis is present.  6. The inferior vena cava is normal in size with greater than 50% respiratory variability, suggesting right atrial pressure of 3 mmHg. FINDINGS  Left Ventricle: Left ventricular ejection fraction, by estimation, is 60 to 65%. The left ventricle has normal function. The left ventricle has no regional wall motion abnormalities. The left ventricular internal cavity size was normal in size. There is  moderate left ventricular hypertrophy. Left ventricular diastolic parameters were normal. Right Ventricle: The right ventricular size is normal. No increase in right ventricular wall thickness. Right  ventricular systolic function is normal. Left Atrium: Left atrial size was mildly dilated. Right Atrium: Right atrial size was normal in size. Pericardium: There is no evidence of pericardial effusion. Mitral Valve: The mitral valve is normal in structure. Normal mobility of the mitral valve leaflets. Trivial mitral valve regurgitation. No evidence of mitral valve stenosis. Tricuspid Valve: The tricuspid valve is normal in structure. Tricuspid valve regurgitation is not demonstrated. No evidence of tricuspid stenosis. Aortic Valve: The aortic valve is normal in structure. Aortic valve regurgitation is not visualized. No aortic stenosis is present. Pulmonic Valve: The pulmonic valve was normal in structure. Pulmonic valve regurgitation is not visualized. No evidence of pulmonic stenosis. Aorta: The aortic root is normal in size and structure. Venous: The inferior vena cava is normal in size with greater than 50% respiratory variability, suggesting right atrial pressure of 3 mmHg. IAS/Shunts: No atrial level shunt detected by color flow Doppler. Agitated saline contrast was given intravenously to evaluate for intracardiac shunting.  LEFT VENTRICLE PLAX 2D LVIDd:  4.50 cm     Diastology LVIDs:         2.80 cm     LV e' lateral:   16.40 cm/s LV PW:         1.40 cm     LV E/e' lateral: 2.3 LV IVS:        1.60 cm     LV e' medial:    7.29 cm/s LVOT diam:     2.10 cm     LV E/e' medial:  5.2 LV SV:         62 LV SV Index:   30 LVOT Area:     3.46 cm  LV Volumes (MOD) LV vol d, MOD A2C: 86.9 ml LV vol d, MOD A4C: 88.2 ml LV vol s, MOD A2C: 29.4 ml LV vol s, MOD A4C: 30.5 ml LV SV MOD A2C:     57.5 ml LV SV MOD A4C:     88.2 ml LV SV MOD BP:      63.6 ml RIGHT VENTRICLE             IVC RV S prime:     16.40 cm/s  IVC diam: 2.40 cm TAPSE (M-mode): 3.0 cm LEFT ATRIUM             Index       RIGHT ATRIUM           Index LA diam:        3.80 cm 1.86 cm/m  RA Area:     15.40 cm LA Vol (A2C):   34.8 ml 17.03 ml/m RA  Volume:   37.10 ml  18.15 ml/m LA Vol (A4C):   35.9 ml 17.57 ml/m LA Biplane Vol: 35.6 ml 17.42 ml/m  AORTIC VALVE LVOT Vmax:   91.20 cm/s LVOT Vmean:  60.900 cm/s LVOT VTI:    0.179 m  AORTA Ao Root diam: 3.80 cm Ao Asc diam:  3.40 cm MITRAL VALVE MV Area (PHT): 2.95 cm    SHUNTS MV Decel Time: 257 msec    Systemic VTI:  0.18 m MV E velocity: 37.60 cm/s  Systemic Diam: 2.10 cm MV A velocity: 57.60 cm/s MV E/A ratio:  0.65 Charlton Haws MD Electronically signed by Charlton Haws MD Signature Date/Time: 03/17/2020/10:24:39 AM    Final     Scheduled Meds: . aspirin  300 mg Rectal Daily   Or  . aspirin  325 mg Oral Daily  . atorvastatin  80 mg Oral Daily  . enoxaparin (LOVENOX) injection  40 mg Subcutaneous Q24H    Continuous Infusions: . sodium chloride 75 mL/hr at 03/17/20 1411     LOS: 1 day     Briant Cedar, MD [facility] Hospitalists  If 7PM-7AM, please contact night-coverage [email or web address] 03/17/2020, 3:52 PM

## 2020-03-17 NOTE — Progress Notes (Signed)
Inpatient Rehab Admissions Coordinator Note:   Per therapy recommendations, pt was screened for CIR candidacy by Estill Dooms, PT, DPT.  At this time we are recommending a CIR consult and I will place an order per our protocol.  Please contact me with questions.   Estill Dooms, PT, DPT [phone] 03/17/20 4:24 PM

## 2020-03-17 NOTE — Progress Notes (Signed)
TCD with bubbles has been completed. Preliminary results can be found in CV Proc through chart review.   03/17/20 1:53 PM Olen Cordial RVT

## 2020-03-17 NOTE — Progress Notes (Signed)
Occupational Therapy note  OT eval completed.  Full note to follow.  Recommend CIR.    03/17/20 1700  OT Time Calculation  OT Start Time (ACUTE ONLY) 1642  OT Stop Time (ACUTE ONLY) 1726  OT Time Calculation (min) 44 min  OT General Charges  $OT Visit 1 Visit  OT Evaluation  $OT Eval Moderate Complexity 1 Mod  OT Treatments  $Self Care/Home Management  8-22 mins  $Neuromuscular Re-education 8-22 mins  Eber Jones., OTR/L Acute Rehabilitation Services Pager [phone] Office [phone]

## 2020-03-18 ENCOUNTER — Encounter (HOSPITAL_COMMUNITY): Payer: Self-pay | Admitting: Internal Medicine

## 2020-03-18 ENCOUNTER — Inpatient Hospital Stay (HOSPITAL_COMMUNITY): Payer: BC Managed Care – PPO

## 2020-03-18 ENCOUNTER — Encounter (HOSPITAL_COMMUNITY)
Admission: EM | Disposition: A | Discharge status: Home / Self Care | Payer: Self-pay | Source: Home / Self Care | Attending: Internal Medicine

## 2020-03-18 ENCOUNTER — Ambulatory Visit (HOSPITAL_COMMUNITY): Payer: PRIVATE HEALTH INSURANCE | Admitting: Clinical

## 2020-03-18 DIAGNOSIS — I639 Cerebral infarction, unspecified: Secondary | ICD-10-CM | POA: Diagnosis not present

## 2020-03-18 DIAGNOSIS — G2579 Other drug induced movement disorders: Secondary | ICD-10-CM

## 2020-03-18 DIAGNOSIS — F329 Major depressive disorder, single episode, unspecified: Secondary | ICD-10-CM | POA: Diagnosis not present

## 2020-03-18 DIAGNOSIS — I6310 Cerebral infarction due to embolism of unspecified precerebral artery: Secondary | ICD-10-CM | POA: Diagnosis not present

## 2020-03-18 HISTORY — PX: TEE WITHOUT CARDIOVERSION: SHX5443

## 2020-03-18 HISTORY — PX: BUBBLE STUDY: SHX6837

## 2020-03-18 LAB — CBC WITH DIFFERENTIAL/PLATELET
Abs Immature Granulocytes: 0.04 10*3/uL (ref 0.00–0.07)
Basophils Absolute: 0.1 10*3/uL (ref 0.0–0.1)
Basophils Relative: 1 %
Eosinophils Absolute: 0.1 10*3/uL (ref 0.0–0.5)
Eosinophils Relative: 1 %
HCT: 45.8 % (ref 39.0–52.0)
Hemoglobin: 15.1 g/dL (ref 13.0–17.0)
Immature Granulocytes: 0 %
Lymphocytes Relative: 15 %
Lymphs Abs: 1.8 10*3/uL (ref 0.7–4.0)
MCH: 28.3 pg (ref 26.0–34.0)
MCHC: 33 g/dL (ref 30.0–36.0)
MCV: 85.9 fL (ref 80.0–100.0)
Monocytes Absolute: 0.8 10*3/uL (ref 0.1–1.0)
Monocytes Relative: 7 %
Neutro Abs: 9.3 10*3/uL — ABNORMAL HIGH (ref 1.7–7.7)
Neutrophils Relative %: 76 %
Platelets: 266 10*3/uL (ref 150–400)
RBC: 5.33 MIL/uL (ref 4.22–5.81)
RDW: 13 % (ref 11.5–15.5)
WBC: 12 10*3/uL — ABNORMAL HIGH (ref 4.0–10.5)
nRBC: 0 % (ref 0.0–0.2)

## 2020-03-18 LAB — BASIC METABOLIC PANEL
Anion gap: 13 (ref 5–15)
BUN: 5 mg/dL — ABNORMAL LOW (ref 6–20)
CO2: 23 mmol/L (ref 22–32)
Calcium: 9.5 mg/dL (ref 8.9–10.3)
Chloride: 103 mmol/L (ref 98–111)
Creatinine, Ser: 0.9 mg/dL (ref 0.61–1.24)
GFR calc Af Amer: >60 mL/min (ref >60)
GFR calc non Af Amer: >60 mL/min (ref >60)
Glucose, Bld: 87 mg/dL (ref 70–99)
Potassium: 3.3 mmol/L — ABNORMAL LOW (ref 3.5–5.1)
Sodium: 139 mmol/L (ref 135–145)

## 2020-03-18 LAB — HOMOCYSTEINE: Homocysteine: 10.4 umol/L (ref 0.0–14.5)

## 2020-03-18 LAB — BETA-2-GLYCOPROTEIN I ABS, IGG/M/A
Beta-2 Glyco I IgG: <9 GPI IgG units (ref 0–20)
Beta-2-Glycoprotein I IgA: <9 GPI IgA units (ref 0–25)
Beta-2-Glycoprotein I IgM: <9 GPI IgM units (ref 0–32)

## 2020-03-18 LAB — CARDIOLIPIN ANTIBODIES, IGG, IGM, IGA
Anticardiolipin IgA: <9 APL U/mL (ref 0–11)
Anticardiolipin IgG: <9 GPL U/mL (ref 0–14)
Anticardiolipin IgM: 11 MPL U/mL (ref 0–12)

## 2020-03-18 SURGERY — ECHOCARDIOGRAM, TRANSESOPHAGEAL
Anesthesia: Monitor Anesthesia Care

## 2020-03-18 MED ORDER — POTASSIUM CHLORIDE 20 MEQ PO PACK
40.0000 meq | PACK | Freq: Once | ORAL | Status: AC
  Administered 2020-03-18: 40 meq via ORAL
  Filled 2020-03-18: qty 2

## 2020-03-18 MED ORDER — SERTRALINE HCL 100 MG PO TABS
300.0000 mg | ORAL_TABLET | Freq: Every day | ORAL | Status: DC
  Administered 2020-03-18: 100 mg via ORAL
  Filled 2020-03-18: qty 3

## 2020-03-18 MED ORDER — CLOPIDOGREL BISULFATE 75 MG PO TABS
75.0000 mg | ORAL_TABLET | Freq: Every day | ORAL | Status: DC
  Administered 2020-03-18 – 2020-03-19 (×2): 75 mg via ORAL
  Filled 2020-03-18 (×2): qty 1

## 2020-03-18 MED ORDER — PROPOFOL 10 MG/ML IV BOLUS
INTRAVENOUS | Status: DC | PRN
  Administered 2020-03-18: 20 mg via INTRAVENOUS
  Administered 2020-03-18: 30 mg via INTRAVENOUS
  Administered 2020-03-18 (×3): 20 mg via INTRAVENOUS

## 2020-03-18 MED ORDER — LIDOCAINE 2% (20 MG/ML) 5 ML SYRINGE
INTRAMUSCULAR | Status: DC | PRN
  Administered 2020-03-18: 60 mg via INTRAVENOUS

## 2020-03-18 MED ORDER — PROPOFOL 500 MG/50ML IV EMUL
INTRAVENOUS | Status: DC | PRN
  Administered 2020-03-18: 100 ug/kg/min via INTRAVENOUS

## 2020-03-18 MED ORDER — ASPIRIN EC 81 MG PO TBEC
81.0000 mg | DELAYED_RELEASE_TABLET | Freq: Every day | ORAL | Status: DC
  Administered 2020-03-19: 81 mg via ORAL
  Filled 2020-03-18: qty 1

## 2020-03-18 MED ORDER — LACTATED RINGERS IV SOLN: INTRAVENOUS | Status: DC

## 2020-03-18 NOTE — TOC Initial Note (Signed)
Transition of Care [facility]) - Initial/Assessment Note    Patient Details  Name: Jackson Terrell MRN: [id number] Date of Birth: 07-13-1991  [facility] CM/SW Contact:    Pollie Friar, RN Phone Number: 03/18/2020, 3:35 PM  Clinical Narrative:                 CM met with the patient and his mother at the bedside. Pt doesn't have health insurance but does have a health savings account through his job. Pt provided this for needed DME. CM has provided this to [employer] and they will deliver the DME to his room.  Outpatient therapy recommended. CM has sent referral to [facility] per discussion with pt.  Information on the AVS. Mom plans on staying with him after d/c until he is able to manage on his own. Mom to provide transport home.    Expected Discharge Plan: OP Rehab Barriers to Discharge: Continued Medical Work up, Inadequate or no insurance   Patient Goals and CMS Choice     Choice offered to / list presented to : Patient  Expected Discharge Plan and Services Expected Discharge Plan: OP Rehab   Discharge Planning Services: CM Consult Post Acute Care Choice: Durable Medical Equipment Living arrangements for the past 2 months: Apartment                                      Prior Living Arrangements/Services Living arrangements for the past 2 months: Apartment Lives with:: Self Patient language and need for interpreter reviewed:: Yes Do you feel safe going back to the place where you live?: Yes      Need for Family Participation in Patient Care: Yes (Comment) Care giver support system in place?: Yes (comment)   Criminal Activity/Legal Involvement Pertinent to Current Situation/Hospitalization: No - Comment as needed  Activities of Daily Living Home Assistive Devices/Equipment: None ADL Screening (condition at time of admission) Patient's cognitive ability adequate to safely complete daily activities?: Yes Is the patient deaf or have  difficulty hearing?: No Does the patient have difficulty seeing, even when wearing glasses/contacts?: No Does the patient have difficulty concentrating, remembering, or making decisions?: No Patient able to express need for assistance with ADLs?: Yes Does the patient have difficulty dressing or bathing?: No Independently performs ADLs?: Yes (appropriate for developmental age) Does the patient have difficulty walking or climbing stairs?: No Weakness of Legs: None Weakness of Arms/Hands: None  Permission Sought/Granted                  Emotional Assessment Appearance:: Appears stated age Attitude/Demeanor/Rapport:  (quiet) Affect (typically observed): Accepting Orientation: : Oriented to Self, Oriented to Place, Oriented to  Time, Oriented to Situation   Psych Involvement: No (comment)  Admission diagnosis:  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 Depression 03/15/2020   PCP:  [place], [facility] Pharmacy:   [place] [place] [address] Phone: [phone] Fax: [phone]     Social Determinants of Health (SDOH) Interventions    Readmission Risk Interventions No flowsheet data found.

## 2020-03-18 NOTE — Progress Notes (Signed)
Inpatient Rehab Admissions:  Inpatient Rehab Consult received.  I met with patient at the bedside for rehabilitation assessment and to discuss goals and expectations of an inpatient rehab admission.  He states he just worked with therapy and recommendations were being updated to outpatient therapy.  I confirmed with PT (not documented at the time of this note), and OT note confirms as well.  CIR will sign off at this time.   Signed: Shann Medal, PT, DPT Admissions Coordinator [phone] 03/18/20  3:17 PM

## 2020-03-18 NOTE — Progress Notes (Signed)
PT Cancellation Note  Patient Details Name: DONIVEN VANPATTEN MRN: [id number] DOB: 05-25-1991   Cancelled Treatment:    Reason Eval/Treat Not Completed: Patient at procedure or test/unavailable - will check back as schedule allows.  Richrd Sox, PT Acute Rehabilitation Services Pager [phone]  Office [phone]    Tyrone Apple D Despina Hidden 03/18/2020, 8:46 AM

## 2020-03-18 NOTE — Anesthesia Postprocedure Evaluation (Signed)
Anesthesia Post Note  Patient: Jackson Terrell  Procedure(s) Performed: TRANSESOPHAGEAL ECHOCARDIOGRAM (TEE) (N/A ) BUBBLE STUDY     Patient location during evaluation: Endoscopy Anesthesia Type: MAC Level of consciousness: awake and alert Pain management: pain level controlled Vital Signs Assessment: post-procedure vital signs reviewed and stable Respiratory status: spontaneous breathing and respiratory function stable Cardiovascular status: stable Postop Assessment: no apparent nausea or vomiting Anesthetic complications: no   No complications documented.  Last Vitals:  Vitals:   03/18/20 1005 03/18/20 1015  BP: (!) 143/96   Pulse: 77 77  Resp: (!) 21 12  Temp:    SpO2: 98% 94%    Last Pain:  Vitals:   03/18/20 0945  TempSrc: Axillary  PainSc: 0-No pain                 Merlinda Frederick

## 2020-03-18 NOTE — Transfer of Care (Signed)
Immediate Anesthesia Transfer of Care Note  Patient: Jackson Terrell  Procedure(s) Performed: TRANSESOPHAGEAL ECHOCARDIOGRAM (TEE) (N/A ) BUBBLE STUDY  Patient Location: Endoscopy Unit  Anesthesia Type:MAC  Level of Consciousness: drowsy and responds to stimulation  Airway & Oxygen Therapy: Patient Spontanous Breathing and Patient connected to nasal cannula oxygen  Post-op Assessment: Report given to RN and Post -op Vital signs reviewed and stable  Post vital signs: Reviewed and stable  Last Vitals:  Vitals Value Taken Time  BP 144/106 03/18/20 0945  Temp    Pulse 102 03/18/20 0946  Resp 19 03/18/20 0946  SpO2 94 % 03/18/20 0946  Vitals shown include unvalidated device data.  Last Pain:  Vitals:   03/18/20 0818  TempSrc: Oral  PainSc: 0-No pain      Patients Stated Pain Goal: 2 (21/11/55 2080)  Complications: No complications documented.

## 2020-03-18 NOTE — Anesthesia Preprocedure Evaluation (Addendum)
Anesthesia Evaluation  Patient identified by MRN, date of birth, ID band  Reviewed: Allergy & Precautions, NPO status , Patient's Chart, lab work & pertinent test results  Airway Mallampati: II  TM Distance: >3 FB Neck ROM: Full    Dental no notable dental hx.    Pulmonary neg pulmonary ROS, former smoker,    Pulmonary exam normal breath sounds clear to auscultation       Cardiovascular Exercise Tolerance: Good negative cardio ROS   Rhythm:Regular Rate:Normal     Neuro/Psych Depression CVA    GI/Hepatic negative GI ROS, Neg liver ROS,   Endo/Other  negative endocrine ROS  Renal/GU negative Renal ROS  negative genitourinary   Musculoskeletal negative musculoskeletal ROS (+)   Abdominal   Peds negative pediatric ROS (+)  Hematology negative hematology ROS (+)   Anesthesia Other Findings   Reproductive/Obstetrics negative OB ROS                            Anesthesia Physical Anesthesia Plan  ASA: III  Anesthesia Plan: MAC   Post-op Pain Management:    Induction:   PONV Risk Score and Plan: 1 and Propofol infusion, TIVA and Treatment may vary due to age or medical condition  Airway Management Planned: Nasal Cannula  Additional Equipment:   Intra-op Plan:   Post-operative Plan:   Informed Consent: I have reviewed the patients History and Physical, chart, labs and discussed the procedure including the risks, benefits and alternatives for the proposed anesthesia with the patient or authorized representative who has indicated his/her understanding and acceptance.       Plan Discussed with: CRNA and Anesthesiologist  Anesthesia Plan Comments:         Anesthesia Quick Evaluation

## 2020-03-18 NOTE — Progress Notes (Signed)
Occupational Therapy Evaluation late entry   Pt presents to OT with the below listed diagnosis and the below listed deficits.  He demonstrates mild Rt sided ataxia, decreased balance, nystagmus, dysconjugate gaze, decreased activity tolerance.  He requires min A for ADLs using RW.  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  Recommend CIR level rehab at discharge.  Will follow acutely.    03/17/20 1700  OT Visit Information  Last OT Received On 03/18/20  History of Present Illness The pt is a 29 yo male presenting with c/o nausea and vomiting, and noted to have diffuse hyperreflexia. 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  Precautions  Precautions Fall  Restrictions  Other Position/Activity Restrictions permissive HTN <220/<110 for 24 hours (start 8:00 AM 9/2)  Home Living  Family/patient expects to be discharged to: Private residence  Living Arrangements Alone  Available Help at Discharge Family;Available 24 hours/day  Type of Home Apartment  Home Access Stairs to enter  Entrance Stairs-Number of Steps pt reports multiple small sets of stairs from parking to apt entrance (totalling ~20 steps), then elevator to 3rd floor apt  Home Layout One level  Bathroom Environmental health practitioner None  Additional Comments Pt's mother is visiting from [facility] and can provide 24 hour assist at discharge   Prior Function  Level of Independence Independent  Comments Pt fully independent.  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 Expressive difficulties (mildly dysarthric )  Pain Assessment  Pain Assessment Faces  Faces Pain Scale 4  Pain Location headache  Pain Descriptors / Indicators Aching  Pain Intervention(s) RN gave pain meds during session  Cognition  Arousal/Alertness  Awake/alert  Behavior During Therapy Flat affect;WFL for tasks assessed/performed  Overall Cognitive Status Within Functional Limits for tasks assessed  General Comments mild delay in processing likely due to dizziness   Upper Extremity Assessment  Upper Extremity Assessment RUE deficits/detail  RUE Deficits / Details mild dysmetria noted   RUE Coordination decreased fine motor;decreased gross motor  Lower Extremity Assessment  Lower Extremity Assessment Defer to PT evaluation  Cervical / Trunk Assessment  Cervical / Trunk Assessment Other exceptions  Cervical / Trunk Exceptions very mild truncal ataxia   ADL  Overall ADL's  Needs assistance/impaired  Eating/Feeding Modified independent;Sitting  Eating/Feeding Details (indicate cue type and reason) occasional spills with use of Rt UE   Grooming Wash/dry face;Wash/dry hands;Oral care;Brushing hair;Minimal assistance;Standing  Grooming Details (indicate cue type and reason) assist to steady   Upper Body Bathing Set up;Supervision/ safety;Sitting  Lower Body Bathing Minimal assistance;Sit to/from stand  Upper Body Dressing  Set up;Sitting  Lower Body Dressing Minimal assistance;Sit to/from Scientist, research (life sciences) Minimal assistance;Ambulation;Comfort height toilet;RW  Statistician Details (indicate cue type and reason) assist to steady   Toileting- IT trainer Min guard;Sit to/from stand  Functional mobility during ADLs Min guard;Rolling walker  Vision- History  Baseline Vision/History Wears glasses  Wears Glasses Distance only  Vision- Assessment  Vision Assessment? Yes  Eye Alignment Impaired (comment)  Ocular Range of Motion [facility]  Tracking/Visual Pursuits Decreased smoothness of horizontal tracking  Convergence Impaired - to be further tested in functional context  Visual Fields No apparent deficits  Additional Comments Pt denies diplopia, but frequently closes Lt eye.  He demonstrates horizontal nystagmus  with Rt gaze.  He looses convergence on object ~18" from nose.  He reports dizziness at all times, but worse with transitional movements   Perception  Perception Tested? Yes  Praxis  Praxis tested? WFL  Bed Mobility  General bed mobility comments Pt up in chair   Transfers  Overall transfer level Needs assistance  Equipment used Rolling walker (2 wheeled)  Transfers Sit to/from Stand;Stand Pivot Transfers  Sit to Stand Min assist  Stand pivot transfers Min assist  General transfer comment min A to steady   Balance  Overall balance assessment Needs assistance  Sitting-balance support Feet supported;No upper extremity supported  Sitting balance-Leahy Scale Fair  Standing balance support Bilateral upper extremity supported  Standing balance-Leahy Scale Poor  Standing balance comment able to maintain static standing with min guard assist , bil. UE support, and with wide BOS    General Comments  General comments (skin integrity, edema, etc.) mother present for most of session   Exercises  Exercises Other exercises  Other Exercises  Other Exercises Pt instructed on gaze fixation strategies.  the letter "A" was posted in his room and he was instructed to fixate gaze on it when moving.    OT - End of Session  Equipment Utilized During Treatment Rolling walker;Gait belt  Activity Tolerance Patient tolerated treatment well  Patient left in bed;with call bell/phone within reach;with bed alarm set  Nurse Communication Mobility status  OT Assessment  OT Recommendation/Assessment Patient needs continued OT Services  OT Visit Diagnosis Unsteadiness on feet (R26.81);Dizziness and giddiness (R42)  OT Problem List Decreased activity tolerance;Impaired balance (sitting and/or standing);Impaired vision/perception;Decreased coordination;Decreased knowledge of use of DME or AE;Pain  OT Plan  OT Frequency (ACUTE ONLY) Min 2X/week  OT Treatment/Interventions (ACUTE ONLY) Self-care/ADL  training;Neuromuscular education;DME and/or AE instruction;Therapeutic activities;Visual/perceptual remediation/compensation;Patient/family education;Balance training  AM-PAC OT "6 Clicks" Daily Activity Outcome Measure (Version 2)  Help from another person eating meals? 4  Help from another person taking care of personal grooming? 3  Help from another person toileting, which includes using toliet, bedpan, or urinal? 3  Help from another person bathing (including washing, rinsing, drying)? 3  Help from another person to put on and taking off regular upper body clothing? 3  Help from another person to put on and taking off regular lower body clothing? 3  6 Click Score 19  OT Recommendation  Recommendations for Other Services Rehab consult  Follow Up Recommendations CIR  OT Equipment 3 in 1 bedside commode;Tub/shower bench  Individuals Consulted  Consulted and Agree with Results and Recommendations Patient  Acute Rehab OT Goals  Patient Stated Goal to get back to normal   OT Goal Formulation With patient  Time For Goal Achievement 04/01/20  Potential to Achieve Goals Good                               Written Expression  Dominant Hand Right  Eber Jones., OTR/L Acute Rehabilitation Services Pager [phone] Office [phone]

## 2020-03-18 NOTE — Anesthesia Procedure Notes (Signed)
Procedure Name: MAC Date/Time: 03/18/2020 9:05 AM Performed by: Moshe Salisbury, CRNA Pre-anesthesia Checklist: Patient identified, Emergency Drugs available, Suction available, Patient being monitored and Timeout performed Patient Re-evaluated:Patient Re-evaluated prior to induction Oxygen Delivery Method: Nasal cannula Placement Confirmation: positive ETCO2 Dental Injury: Teeth and Oropharynx as per pre-operative assessment

## 2020-03-18 NOTE — Progress Notes (Signed)
STROKE TEAM PROGRESS NOTE   INTERVAL HISTORY  Patient seen just after TEE.  TEE was unremarkable.  He states his headache and dizziness have improved and therapist recommend outpatient therapies. Hypercoagulable labs are pending Vitals:   03/18/20 0955 03/18/20 1005 03/18/20 1015 03/18/20 1123  BP: (!) 131/100 (!) 143/96  (!) 144/97  Pulse: 81 77 77 80  Resp: 16 (!) 21 12 (!) 21  Temp:    98.2 F (36.8 C)  TempSrc:    Oral  SpO2: 95% 98% 94% 95%  Weight:      Height:       CBC:  Recent Labs  Lab 03/15/20 1700 03/15/20 1700 03/16/20 0509 03/17/20 0559  WBC 13.5*   < > 9.2 8.3  NEUTROABS 10.0*  --   --  5.5  HGB 16.3   < > 14.3 13.8  HCT 49.6   < > 44.7 42.5  MCV 87.3   < > 88.7 87.3  PLT 270   < > 246 232   < > = values in this interval not displayed.   Basic Metabolic Panel:  Recent Labs  Lab 03/15/20 1700 03/15/20 1700 03/16/20 0509 03/17/20 0559  NA 141   < > 140 138  K 4.9   < > 3.8 3.4*  CL 106   < > 106 104  CO2 21*   < > 24 25  GLUCOSE 112*   < > 110* 93  BUN 9   < > 5* 6  CREATININE 0.98   < > 0.83 0.70  CALCIUM 9.4   < > 9.1 8.8*  MG 2.4  --   --   --    < > = values in this interval not displayed.   Lipid Panel:  Recent Labs  Lab 03/17/20 0559  CHOL 157  TRIG 85  HDL 44  CHOLHDL 3.6  VLDL 17  LDLCALC 96   HgbA1c:  Recent Labs  Lab 03/17/20 0559  HGBA1C 5.4   Urine Drug Screen:  Recent Labs  Lab 03/16/20 0156  LABOPIA NONE DETECTED  COCAINSCRNUR NONE DETECTED  LABBENZ POSITIVE*  AMPHETMU NONE DETECTED  THCU NONE DETECTED  LABBARB NONE DETECTED    Alcohol Level No results for input(s): ETH in the last 168 hours.  IMAGING past 24 hours ECHO TEE  Result Date: 03/18/2020    TRANSESOPHOGEAL ECHO REPORT   Patient Name:   Jackson Terrell Date of Exam: 03/18/2020 Medical Rec #:  [id number]           Height:       68.0 in Accession #:    [id number]          Weight:       200.0 lb Date of Birth:  [birth year]           BSA:          2.044  m Patient Age:    29 years            BP:           110/80 mmHg Patient Gender: M                   HR:           98 bpm. Exam Location:  Inpatient Procedure: Transesophageal Echo Indications:    stoke  History:        Patient has prior history of Echocardiogram examinations.  Stroke.  Sonographer:    Tiffany Dance Referring Phys: [id number] Beatriz Stallion PROCEDURE: The transesophogeal probe was passed without difficulty through the esophogus of the patient. Sedation performed by different physician. The patient's vital signs; including heart rate, blood pressure, and oxygen saturation; remained stable throughout the procedure. The patient developed no complications during the procedure. IMPRESSIONS  1. Left ventricular ejection fraction, by estimation, is 60 to 65%. The left ventricle has normal function. The left ventricle has no regional wall motion abnormalities.  2. Right ventricular systolic function is normal. The right ventricular size is normal.  3. No left atrial/left atrial appendage thrombus was detected.  4. The mitral valve is normal in structure. Trivial mitral valve regurgitation. No evidence of mitral stenosis.  5. The aortic valve is normal in structure. Aortic valve regurgitation is not visualized. No aortic stenosis is present.  6. The inferior vena cava is normal in size with greater than 50% respiratory variability, suggesting right atrial pressure of 3 mmHg.  7. Agitated saline contrast bubble study was negative, with no evidence of any interatrial shunt. Conclusion(s)/Recommendation(s): Normal biventricular function without evidence of hemodynamically significant valvular heart disease. FINDINGS  Left Ventricle: Left ventricular ejection fraction, by estimation, is 60 to 65%. The left ventricle has normal function. The left ventricle has no regional wall motion abnormalities. The left ventricular internal cavity size was normal in size. There is  no left ventricular  hypertrophy. Right Ventricle: The right ventricular size is normal. No increase in right ventricular wall thickness. Right ventricular systolic function is normal. Left Atrium: Left atrial size was normal in size. No left atrial/left atrial appendage thrombus was detected. Right Atrium: Right atrial size was normal in size. Pericardium: There is no evidence of pericardial effusion. Mitral Valve: The mitral valve is normal in structure. Normal mobility of the mitral valve leaflets. Trivial mitral valve regurgitation. No evidence of mitral valve stenosis. Tricuspid Valve: The tricuspid valve is normal in structure. Tricuspid valve regurgitation is trivial. No evidence of tricuspid stenosis. Aortic Valve: The aortic valve is normal in structure. Aortic valve regurgitation is not visualized. No aortic stenosis is present. Pulmonic Valve: The pulmonic valve was normal in structure. Pulmonic valve regurgitation is not visualized. No evidence of pulmonic stenosis. Aorta: The aortic root is normal in size and structure. Venous: The inferior vena cava is normal in size with greater than 50% respiratory variability, suggesting right atrial pressure of 3 mmHg. IAS/Shunts: No atrial level shunt detected by color flow Doppler. Agitated saline contrast was given intravenously to evaluate for intracardiac shunting. Agitated saline contrast bubble study was negative, with no evidence of any interatrial shunt.  MITRAL VALVE MV Area (plan): 7.99 cm Charlton Haws MD Electronically signed by Charlton Haws MD Signature Date/Time: 03/18/2020/10:00:15 AM    Final     PHYSICAL EXAM Pleasant young Caucasian male not in distress. . Afebrile. Head is nontraumatic. Neck is supple without bruit.    Cardiac exam no murmur or gallop. Lungs are clear to auscultation. Distal pulses are well felt. Neurological Exam ;  Awake  Alert oriented x 3. Normal speech and language.eye movements full without nystagmus but mild saccadic dysmetria on right  lateral gaze..fundi were not visualized. Vision acuity and fields appear normal. Hearing is normal. Palatal movements are normal. Face symmetric. Tongue midline. Normal strength, tone, reflexes and coordination. Normal sensation. Gait deferred.  ASSESSMENT/PLAN Mr. Jackson Terrell is a 29 y.o. male withPMH significant for scoliosis, sertraline who presents with diaphoresis, nausea/vomiting. He was  noted to have dilated pupils, hyperreflexia and increased tone in the ED. Given his presentation, neurology was consulted to assess for potential serotonin syndrome. MRI showed cerebellar CVA.  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 Likely an acute infarct however cannot exclude demyelination. Consider postcontrast imaging for further evaluation.  MRA  No abnormal enhancement. This is suggestive of acute infarct involving the superior right cerebellum and vermis;   Carotid Doppler seenormal MRA neck   2D Echo 1. Left ventricular ejection fraction, by estimation, is 60 to 65%. The  left ventricle has normal function. The left ventricle has no regional  wall motion abnormalities. There is moderate left ventricular hypertrophy.  Left ventricular diastolic  parameters were normal.  2. Right ventricular systolic function is normal. The right ventricular  size is normal.  3. Left atrial size was mildly dilated.  4. The mitral valve is normal in structure. Trivial mitral valve  regurgitation. No evidence of mitral stenosis.  5. The aortic valve is normal in structure. Aortic valve regurgitation is  not visualized. No aortic stenosis is present.  6. The inferior vena cava is normal in size with greater than 50%  respiratory variability, suggesting right atrial pressure of 3 mmHg.  TEE: Normal   hypercoagulable studies: pending  LDL 96  HgbA1c 5.4  VTE prophylaxis - none    Diet   Diet  Heart Room service appropriate? Yes; Fluid consistency: Thin    No antithrombotic prior to admission, now on aspirin 325 mg daily.   Therapy recommendations:  pending  Disposition:  pending  Hypertension  Home meds:  none  Stable . Permissive hypertension (OK if < 220/120) but gradually normalize in 5-7 days . Long-term BP goal normotensive  Hyperlipidemia  Home meds: none  LDL 96, goal < 70  Add atorvastatin 80 mg  Continue statin at discharge  Diabetes type II Controlled  Home meds:  none  HgbA1c 5.4, goal < 7.0  CBGs Recent Labs    03/15/20 1628  GLUCAP 113*    Other Stroke Risk Factors   ETOH use,  advised to drink no more than 2 drink(s) a day  Obesity, Body mass index is 30.41 kg/m., recommend weight loss, diet and exercise as appropriate     Hospital day # 2     He presented with sudden onset of nausea, dizziness and gait imbalance likely due to right superior cerebellar embolic infarct etiology to be determined.  Recommend continue ongoing stroke work-up.    Check ANA panel, anticardiolipin antibodies, 2D echo.  Aspirin Plavix for 3 weeks followed by aspirin alone.  Follow-up as an outpatient in the stroke clinic in 6 weeks.  Long discussion the patient and answered questions.  Discussed with Dr. Sharolyn Douglas.  Greater than 50% time during this 25-minute visit was spent on counseling and coordination of care about embolic stroke and discussion about evaluation and prevention and answering questions.  Stroke team will sign off.  Can call for questions  Delia Heady, MD Medical Director [facility] Pager: [phone] 03/18/2020 3:07 PM   To contact Stroke Continuity provider, please refer to [email or web address]. After hours, contact General Neurology

## 2020-03-18 NOTE — Progress Notes (Addendum)
Pt. Took 100 mg of Zoloft and refused the other prescribed 200 mg. Pt stated that he originally had 100 mg ordered  at home and has been taking 300 mg instead. He believes the additional dosing is the reason for his current admission. MD Annamary Carolin has been notified.

## 2020-03-18 NOTE — Interval H&P Note (Signed)
History and Physical Interval Note:  03/18/2020 8:54 AM  Jackson Terrell  has presented today for surgery, with the diagnosis of STROKE.  The various methods of treatment have been discussed with the patient and family. After consideration of risks, benefits and other options for treatment, the patient has consented to  Procedure(s): TRANSESOPHAGEAL ECHOCARDIOGRAM (TEE) (N/A) as a surgical intervention.  The patient's history has been reviewed, patient examined, no change in status, stable for surgery.  I have reviewed the patient's chart and labs.  Questions were answered to the patient's satisfaction.     Charlton Haws

## 2020-03-18 NOTE — Progress Notes (Signed)
PROGRESS NOTE  Jackson Terrell LFY:[id number] DOB: 14-Apr-1991 DOA: 03/15/2020 PCP: [facility]  HPI/Recap of past 24 hours: HPI from Dr Ileene Rubens is a 29 y.o. male with medical history significant for depression who presents with concerns of nausea, vomiting and dizziness. Denies any diarrhea abdominal pain. Reports that he has been taking his sertraline at 300 mg for the past year since he did not feel like the lower dose was working. Denies any suicidal ideation but that he is just "stressed." Denies any tobacco use. Has occasional alcohol use.  States he has not used marijuana in over a year. ED Course: He was diaphoretic, mildly tachycardic and hypertensive up to 170s.  He was noted to have sustained hyperreflexia of the patella reflex.  Neurology was consulted by ED physician for concerns of serotonin syndrome. Neurology has evaluated bedside and recommends as needed Ativan and hospitalist admission for serotonin syndrome.    Today, patient denies any new complaints, reports generalized fatigue.  HadTEE today.  Mother at bedside.   Assessment/Plan:  Principal Problem:   Serotonin syndrome Active Problems:   Depression   Cerebral embolism with cerebral infarction  Serotonin syndrome Likely from SSRI overmedication (takes 300 mg of sertraline daily) Hold sertraline, avoid all teratogenic medication, avoid Zofran Continue as needed Ativan for any hyperthermia (will not be relieved with Tylenol), hypertension SBP greater than 150, tachycardia heart rate greater than 100, agitation Esmolol could be considered if not relieved with Ativan UDS unremarkable Neurology on board, appreciate recs S/P IV fluids Continue telemetry  Right cerebellum infarct MRI brain showed above MRA head suggestive of acute infarct, normal MRA neck MR cervical spine showed no evidence of demyelination all myelopathy, no significant spinal canal or neural  foraminal narrowing LDL 96, A1c 5.4 Hypercoagulable studies pending TTE with EF of 60 to 65%, no regional wall motion abnormality, negative for intracardiac shunting TEE done on 03/18/2020 was normal, no LAA thrombus, no ASD/PFO, negative bubble study Neurology consulted, recommend aspirin and Plavix for 3 weeks, then aspirin alone Continue aspirin, Lipitor  Obesity Lifestyle modification advised  Depression Psychiatry consulted, recommend outpatient follow-up and holding sertraline If not seen in the outpatient, may resume sertraline at 100 mg daily on 03/21/2020 Transition of care consulted for outpatient psychiatry follow-up      Malnutrition Type:      Malnutrition Characteristics:      Nutrition Interventions:       Estimated body mass index is 30.41 kg/m as calculated from the following:   Height as of this encounter: 5\' 8"  (1.727 m).   Weight as of this encounter: 90.7 kg.     Code Status: Full  Family Communication: Discussed with patient and mother at bedside on 03/18/20  Disposition Plan: Likely home      Consultants:  Neurology  Cardiology  Procedures:  TEE on 03/18/20  Antimicrobials:  None  DVT prophylaxis: Lovenox   Objective: Vitals:   03/18/20 1005 03/18/20 1015 03/18/20 1123 03/18/20 1520  BP: (!) 143/96  (!) 144/97 (!) 152/99  Pulse: 77 77 80 87  Resp: (!) 21 12 (!) 21 20  Temp:   98.2 F (36.8 C) 97.9 F (36.6 C)  TempSrc:   Oral Oral  SpO2: 98% 94% 95% 96%  Weight:      Height:        Intake/Output Summary (Last 24 hours) at 03/18/2020 1803 Last data filed at 03/18/2020 0941 Gross per 24 hour  Intake  500 ml  Output --  Net 500 ml   Filed Weights   03/15/20 1627 03/16/20 1427 03/18/20 0818  Weight: 90.7 kg 90.7 kg 90.7 kg    Exam:  General: NAD  Cardiovascular: S1, S2 present  Respiratory: CTAB  Abdomen: Soft, nontender, nondistended, bowel sounds present  Musculoskeletal: No bilateral pedal edema  noted  Skin: Normal  Psychiatry: Normal mood  Neurology: No obvious focal neurologic deficits noted    Data Reviewed: CBC: Recent Labs  Lab 03/15/20 1700 03/16/20 0509 03/17/20 0559  WBC 13.5* 9.2 8.3  NEUTROABS 10.0*  --  5.5  HGB 16.3 14.3 13.8  HCT 49.6 44.7 42.5  MCV 87.3 88.7 87.3  PLT 270 246 232   Basic Metabolic Panel: Recent Labs  Lab 03/15/20 1700 03/16/20 0509 03/17/20 0559  NA 141 140 138  K 4.9 3.8 3.4*  CL 106 106 104  CO2 21* 24 25  GLUCOSE 112* 110* 93  BUN 9 5* 6  CREATININE 0.98 0.83 0.70  CALCIUM 9.4 9.1 8.8*  MG 2.4  --   --    GFR: Estimated Creatinine Clearance: 147.6 mL/min (by C-G formula based on SCr of 0.7 mg/dL). Liver Function Tests: Recent Labs  Lab 03/15/20 1700  AST 70*  ALT 63*  ALKPHOS 74  BILITOT 1.5*  PROT 7.8  ALBUMIN 4.7   Recent Labs  Lab 03/15/20 1700  LIPASE 28   No results for input(s): AMMONIA in the last 168 hours. Coagulation Profile: No results for input(s): INR, PROTIME in the last 168 hours. Cardiac Enzymes: Recent Labs  Lab 03/15/20 1700  CKTOTAL 152   BNP (last 3 results) No results for input(s): PROBNP in the last 8760 hours. HbA1C: Recent Labs    03/17/20 0559  HGBA1C 5.4   CBG: Recent Labs  Lab 03/15/20 1628  GLUCAP 113*   Lipid Profile: Recent Labs    03/17/20 0559  CHOL 157  HDL 44  LDLCALC 96  TRIG 85  CHOLHDL 3.6   Thyroid Function Tests: No results for input(s): TSH, T4TOTAL, FREET4, T3FREE, THYROIDAB in the last 72 hours. Anemia Panel: No results for input(s): VITAMINB12, FOLATE, FERRITIN, TIBC, IRON, RETICCTPCT in the last 72 hours. Urine analysis:    Component Value Date/Time   COLORURINE YELLOW 03/16/2020 0156   APPEARANCEUR HAZY (A) 03/16/2020 0156   LABSPEC 1.015 03/16/2020 0156   PHURINE 7.0 03/16/2020 0156   GLUCOSEU NEGATIVE 03/16/2020 0156   HGBUR NEGATIVE 03/16/2020 0156   BILIRUBINUR NEGATIVE 03/16/2020 0156   BILIRUBINUR neg 11/10/2013 1021    KETONESUR NEGATIVE 03/16/2020 0156   PROTEINUR NEGATIVE 03/16/2020 0156   UROBILINOGEN negative 11/10/2013 1021   NITRITE NEGATIVE 03/16/2020 0156   LEUKOCYTESUR NEGATIVE 03/16/2020 0156   Sepsis Labs: @LABRCNTIP (procalcitonin:4,lacticidven:4)  ) Recent Results (from the past 240 hour(s))  SARS Coronavirus 2 by RT PCR (hospital order, performed in [facility] hospital lab) Nasopharyngeal Nasopharyngeal Swab     Status: None   Collection Time: 03/15/20  5:20 PM   Specimen: Nasopharyngeal Swab  Result Value Ref Range Status   SARS Coronavirus 2 NEGATIVE NEGATIVE Final    Comment: (NOTE) SARS-CoV-2 target nucleic acids are NOT DETECTED.  The SARS-CoV-2 RNA is generally detectable in upper and lower respiratory specimens during the acute phase of infection. The lowest concentration of SARS-CoV-2 viral copies this assay can detect is 250 copies / mL. A negative result does not preclude SARS-CoV-2 infection and should not be used as the sole basis for treatment or other  patient management decisions.  A negative result may occur with improper specimen collection / handling, submission of specimen other than nasopharyngeal swab, presence of viral mutation(s) within the areas targeted by this assay, and inadequate number of viral copies (<250 copies / mL). A negative result must be combined with clinical observations, patient history, and epidemiological information.  Fact Sheet for Patients:   BoilerBrush.com.cy  Fact Sheet for Healthcare Providers: https://pope.com/  This test is not yet approved or  cleared by the [place] FDA and has been authorized for detection and/or diagnosis of SARS-CoV-2 by FDA under an Emergency Use Authorization (EUA).  This EUA will remain in effect (meaning this test can be used) for the duration of the COVID-19 declaration under Section 564(b)(1) of the Act, 21 U.S.C. section 360bbb-3(b)(1), unless  the authorization is terminated or revoked sooner.  Performed at [facility], [address] [place], [place] [address]   Urine culture     Status: None   Collection Time: 03/16/20  1:36 AM   Specimen: Urine, Random  Result Value Ref Range Status   Specimen Description URINE, RANDOM  Final   Special Requests NONE  Final   Culture   Final    NO GROWTH Performed at [facility], [address], [place], [place] [address]    Report Status 03/16/2020 FINAL  Final      Studies: ECHO TEE  Result Date: 03/18/2020    TRANSESOPHOGEAL ECHO REPORT   Patient Name:   Jackson Terrell Date of Exam: 03/18/2020 Medical Rec #:  [id number]           Height:       68.0 in Accession #:    [id number]          Weight:       200.0 lb Date of Birth:  March 23, 1991           BSA:          2.044 m Patient Age:    29 years            BP:           110/80 mmHg Patient Gender: M                   HR:           98 bpm. Exam Location:  Inpatient Procedure: Transesophageal Echo Indications:    stoke  History:        Patient has prior history of Echocardiogram examinations.                 Stroke.  Sonographer:    Tiffany Dance Referring Phys: [id number] Beatriz Stallion PROCEDURE: The transesophogeal probe was passed without difficulty through the esophogus of the patient. Sedation performed by different physician. The patient's vital signs; including heart rate, blood pressure, and oxygen saturation; remained stable throughout the procedure. The patient developed no complications during the procedure. IMPRESSIONS  1. Left ventricular ejection fraction, by estimation, is 60 to 65%. The left ventricle has normal function. The left ventricle has no regional wall motion abnormalities.  2. Right ventricular systolic function is normal. The right ventricular size is normal.  3. No left atrial/left atrial appendage thrombus was detected.  4. The mitral valve is normal in structure. Trivial mitral valve regurgitation. No  evidence of mitral stenosis.  5. The aortic valve is normal in structure. Aortic valve regurgitation is not visualized. No aortic stenosis is present.  6. The inferior vena cava is normal in size with greater than 50% respiratory variability, suggesting right atrial pressure of 3 mmHg.  7. Agitated saline contrast bubble study was negative, with no evidence of any interatrial shunt. Conclusion(s)/Recommendation(s): Normal biventricular function without evidence of hemodynamically significant valvular heart disease. FINDINGS  Left Ventricle: Left ventricular ejection fraction, by estimation, is 60 to 65%. The left ventricle has normal function. The left ventricle has no regional wall motion abnormalities. The left ventricular internal cavity size was normal in size. There is  no left ventricular hypertrophy. Right Ventricle: The right ventricular size is normal. No increase in right ventricular wall thickness. Right ventricular systolic function is normal. Left Atrium: Left atrial size was normal in size. No left atrial/left atrial appendage thrombus was detected. Right Atrium: Right atrial size was normal in size. Pericardium: There is no evidence of pericardial effusion. Mitral Valve: The mitral valve is normal in structure. Normal mobility of the mitral valve leaflets. Trivial mitral valve regurgitation. No evidence of mitral valve stenosis. Tricuspid Valve: The tricuspid valve is normal in structure. Tricuspid valve regurgitation is trivial. No evidence of tricuspid stenosis. Aortic Valve: The aortic valve is normal in structure. Aortic valve regurgitation is not visualized. No aortic stenosis is present. Pulmonic Valve: The pulmonic valve was normal in structure. Pulmonic valve regurgitation is not visualized. No evidence of pulmonic stenosis. Aorta: The aortic root is normal in size and structure. Venous: The inferior vena cava is normal in size with greater than 50% respiratory variability, suggesting right  atrial pressure of 3 mmHg. IAS/Shunts: No atrial level shunt detected by color flow Doppler. Agitated saline contrast was given intravenously to evaluate for intracardiac shunting. Agitated saline contrast bubble study was negative, with no evidence of any interatrial shunt.  MITRAL VALVE MV Area (plan): 7.99 cm Charlton HawsPeter Nishan MD Electronically signed by Charlton HawsPeter Nishan MD Signature Date/Time: 03/18/2020/10:00:15 AM    Final     Scheduled Meds: . aspirin  300 mg Rectal Daily   Or  . aspirin  325 mg Oral Daily  . atorvastatin  80 mg Oral Daily  . enoxaparin (LOVENOX) injection  40 mg Subcutaneous Q24H    Continuous Infusions:    LOS: 2 days     Briant CedarNkeiruka J Aria Jarrard, MD [facility]  If 7PM-7AM, please contact night-coverage [email or web address] 03/18/2020, 6:03 PM

## 2020-03-18 NOTE — Consult Note (Signed)
Physical Medicine and Rehabilitation Consult Reason for Consult: Decreased functional ability with hyperreflexia Referring Physician: [facility]   HPI: Jackson Terrell is a 29 y.o. right-handed male remote tobacco abuse, depression maintained on Zoloft.  Per chart review patient lives alone independent prior to admission working as an Airline pilot.  Apartment level housing with multiple stairs to entry.  Presented 03/15/2020 with nausea vomiting dizziness and hyperreflexia.  Patient noted to be diaphoretic in the ED and hypertensive in the 170s.  MRI of the brain showed restricted diffusion involving the superior right cerebellum and vermis likely representing acute infarct.  MRA angio of head and neck high-grade short segment narrowing involving the proximal left PICA.  Admission chemistries glucose 112, AST 70, ALT 63, lipase 28, magnesium 2.4, TSH 1.125, urine drug screen positive benzos.  Echocardiogram with ejection fraction of 60 to 65% no wall motion abnormalities.  Currently maintained on aspirin for CVA prophylaxis.  Subcutaneous Lovenox for DVT prophylaxis.  TEE showed no thrombus or PFO.  Negative bubble study..  Therapy evaluations completed with recommendations of physical medicine rehab consult.  Review of Systems  Constitutional: Negative for chills and fever.  HENT: Negative for hearing loss.   Eyes: Negative for blurred vision and double vision.  Respiratory: Negative for cough and shortness of breath.   Cardiovascular: Negative for chest pain, palpitations and leg swelling.  Gastrointestinal: Positive for nausea and vomiting.  Genitourinary: Negative for dysuria, flank pain and hematuria.  Musculoskeletal: Positive for myalgias.  Skin: Negative for rash.  Neurological: Positive for dizziness.       Hyperreflexia  All other systems reviewed and are negative.  Past Medical History:  Diagnosis Date  . Scoliosis    mild   No past surgical history on file. Family History   Problem Relation Age of Onset  . Hemachromatosis Father   . Cancer Neg Hx   . Diabetes Neg Hx    Social History:  reports that he quit smoking about 12 years ago. His smoking use included cigarettes. He has never used smokeless tobacco. He reports current alcohol use. He reports that he does not use drugs. Allergies: No Known Allergies Medications Prior to Admission  Medication Sig Dispense Refill  . sertraline (ZOLOFT) 100 MG tablet Take 300 mg by mouth daily.    . imiquimod (ALDARA) 5 % cream Apply topically 3 (three) times a week. (Patient not taking: Reported on 03/15/2020) 12 each 0    Home: Home Living Family/patient expects to be discharged to:: Private residence Living Arrangements: Alone Available Help at Discharge: Family, Available 24 hours/day (mom visiting from [place], can stay) Type of Home: Apartment Home Access: Stairs to enter Entrance Stairs-Number of Steps: pt reports multiple small sets of stairs from parking to apt entrance (totalling ~20 steps), then elevator to 3rd floor apt Home Layout: One level Bathroom Shower/Tub: Engineer, manufacturing systems: Standard Home Equipment: None  Lives With: Alone  Functional History: Prior Function Level of Independence: Independent Comments: independent, 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 minA to steady with lateral steps Ambulation/Gait Ambulation/Gait  assistance: Min Chemical engineerassist Gait Distance (Feet): 3 Feet Assistive device: 1  person hand held assist Gait Pattern/deviations: Step-to pattern, Shuffle General Gait Details: small lateral steps to recliner, minA to steady. standing marches with modA through [facility] Gait velocity interpretation: <1.31 ft/sec, indicative of household ambulator    ADL:    Cognition: Cognition Overall Cognitive Status: Within Functional Limits for tasks assessed Arousal/Alertness: Awake/alert Orientation Level: Oriented X4 Cognition Arousal/Alertness: Lethargic Behavior During Therapy: WFL for tasks assessed/performed, Flat affect Overall Cognitive Status: Within Functional Limits for tasks assessed General Comments: pt slightly lethargic (eyes open on command but generally closing when not directly cued). but agreeable throughout, [facility]  Blood pressure (!) 146/101, pulse 85, temperature 98.5 F (36.9 C), temperature source Oral, resp. rate 17, height 5\' 8"  (1.727 m), weight 90.7 kg, SpO2 93 %. Physical Exam  General: Alert and oriented x 3, No apparent distress, somnolent, easily arousable HEENT: Head is normocephalic, atraumatic, PERRLA, EOMI, sclera anicteric, oral mucosa pink and moist, dentition intact, ext ear canals clear,  Neck: Supple without JVD or lymphadenopathy Heart: Reg rate and rhythm. No murmurs rubs or gallops Chest: CTA bilaterally without wheezes, rales, or rhonchi; no distress Abdomen: Soft, non-tender, non-distended, bowel sounds positive. Extremities: No clubbing, cyanosis, or edema. Pulses are 2+ Skin: Clean and intact without signs of breakdown Neuro: Patient is alert in no acute distress makes good eye contact with examiner follows commands.  Oriented x3. Right FTN slightly impaired.  Psych: Pt's affect is appropriate. Pt is cooperative  Results for orders placed or performed during the hospital encounter of 03/15/20 (from the past 24 hour(s))  CBC with Differential/Platelet     Status: None   Collection Time: 03/17/20  5:59 AM  Result Value  Ref Range   WBC 8.3 4.0 - 10.5 K/uL   RBC 4.87 4.22 - 5.81 MIL/uL   Hemoglobin 13.8 13.0 - 17.0 g/dL   HCT 16.142.5 39 - 52 %   MCV 87.3 80.0 - 100.0 fL   MCH 28.3 26.0 - 34.0 pg   MCHC 32.5 30.0 - 36.0 g/dL   RDW 09.613.2 04.511.5 - 40.915.5 %   Platelets 232 150 - 400 K/uL   nRBC 0.0 0.0 - 0.2 %   Neutrophils Relative % 66 %   Neutro Abs 5.5 1.7 - 7.7 K/uL   Lymphocytes Relative 23 %   Lymphs Abs 1.9 0.7 - 4.0 K/uL   Monocytes Relative 9 %   Monocytes Absolute 0.7 0 - 1 K/uL   Eosinophils Relative 1 %   Eosinophils Absolute 0.1 0 - 0 K/uL   Basophils Relative 1 %   Basophils Absolute 0.1 0 - 0 K/uL   Immature Granulocytes 0 %   Abs Immature Granulocytes 0.02 0.00 - 0.07 K/uL  Basic metabolic panel     Status: Abnormal   Collection Time: 03/17/20  5:59 AM  Result Value Ref Range   Sodium 138 135 - 145 mmol/L   Potassium 3.4 (L) 3.5 - 5.1 mmol/L   Chloride 104 98 - 111 mmol/L   CO2 25 22 - 32 mmol/L   Glucose, Bld 93 70 - 99 mg/dL   BUN 6 6 - 20 mg/dL   Creatinine, Ser 8.110.70 0.61 - 1.24 mg/dL   Calcium 8.8 (L) 8.9 - 10.3 mg/dL   GFR calc non Af Amer >60 >60 mL/min   GFR calc Af Amer >60 >60 mL/min   Anion gap 9 5 - 15  Hemoglobin A1c     Status:  None   Collection Time: 03/17/20  5:59 AM  Result Value Ref Range   Hgb A1c MFr Bld 5.4 4.8 - 5.6 %   Mean Plasma Glucose 108.28 mg/dL  Lipid panel     Status: None   Collection Time: 03/17/20  5:59 AM  Result Value Ref Range   Cholesterol 157 0 - 200 mg/dL   Triglycerides 85 <578 mg/dL   HDL 44 >46 mg/dL   Total CHOL/HDL Ratio 3.6 RATIO   VLDL 17 0 - 40 mg/dL   LDL Cholesterol 96 0 - 99 mg/dL  Antithrombin III     Status: None   Collection Time: 03/17/20 12:29 PM  Result Value Ref Range   AntiThromb III Func 110 75 - 120 %  Sedimentation rate     Status: None   Collection Time: 03/17/20 12:29 PM  Result Value Ref Range   Sed Rate 9 0 - 16 mm/hr   MR ANGIO HEAD WO CONTRAST  Result Date: 03/17/2020 CLINICAL DATA:  Neuro deficit  EXAM: MRI HEAD WITH CONTRAST MRA HEAD WITHOUT CONTRAST MRA NECK WITHOUT AND WITH CONTRAST TECHNIQUE: Multiplanar, multiecho pulse sequences of the brain and surrounding structures were obtained without intravenous contrast. Angiographic images of the head were obtained using MRA technique without contrast. Angiographic images of the neck were obtained using MRA technique with and without intravenous contrast. Carotid stenosis measurements (when applicable) are obtained utilizing NASCET criteria, using the distal internal carotid diameter as the denominator. CONTRAST:  9mL GADAVIST GADOBUTROL 1 MMOL/ML IV SOLN COMPARISON:  Prior same day MRI head. FINDINGS: MRI HEAD FINDINGS Brain: No abnormal enhancement within site of restricted diffusion involving the superior right cerebellum and vermis. No midline shift, ventriculomegaly or extra-axial fluid collection. No mass lesion. Vascular: Normal flow voids. Skull and upper cervical spine: Normal marrow signal. Sinuses/Orbits: Normal orbits. Aerated sinuses and mastoid air cells. Other: None. MRA HEAD FINDINGS Anterior circulation: Normal caliber patent appearance of the internal carotid, middle and anterior cerebral arteries. No significant stenosis, proximal occlusion, aneurysm, or vascular malformation. Posterior circulation: Dominant right vertebral artery. Proximally patent right PICA. Short-segment defects involving the proximal left PICA may reflect high-grade narrowing. Dominant right AICA. Patent normal caliber basilar artery. Patent normal caliber bilateral posterior cerebral arteries. The bilateral superior cerebellar arteries are proximally patent, diminutive on the left. No significant stenosis, proximal occlusion, aneurysm, or vascular malformation. Venous sinuses: Patent Anatomic variants: None of significance. MRA NECK FINDINGS There is no high-grade narrowing or focal aneurysm involving the bilateral carotid arteries. No evidence of dissection. The  bilateral vertebral arteries are patent and demonstrate antegrade flow. Dominant right vertebral artery. No evidence of high-grade narrowing or focal aneurysm. IMPRESSION: No abnormal enhancement. This is suggestive of acute infarct involving the superior right cerebellum and vermis. High-grade short-segment narrowing involving the proximal left PICA. Otherwise unremarkable MRA head. Normal MRA neck. Electronically Signed   By: Stana Bunting M.D.   On: 03/17/2020 09:13   MR ANGIO NECK W WO CONTRAST  Result Date: 03/17/2020 CLINICAL DATA:  Neuro deficit EXAM: MRI HEAD WITH CONTRAST MRA HEAD WITHOUT CONTRAST MRA NECK WITHOUT AND WITH CONTRAST TECHNIQUE: Multiplanar, multiecho pulse sequences of the brain and surrounding structures were obtained without intravenous contrast. Angiographic images of the head were obtained using MRA technique without contrast. Angiographic images of the neck were obtained using MRA technique with and without intravenous contrast. Carotid stenosis measurements (when applicable) are obtained utilizing NASCET criteria, using the distal internal carotid diameter as the denominator. CONTRAST:  9mL GADAVIST GADOBUTROL 1 MMOL/ML IV SOLN COMPARISON:  Prior same day MRI head. FINDINGS: MRI HEAD FINDINGS Brain: No abnormal enhancement within site of restricted diffusion involving the superior right cerebellum and vermis. No midline shift, ventriculomegaly or extra-axial fluid collection. No mass lesion. Vascular: Normal flow voids. Skull and upper cervical spine: Normal marrow signal. Sinuses/Orbits: Normal orbits. Aerated sinuses and mastoid air cells. Other: None. MRA HEAD FINDINGS Anterior circulation: Normal caliber patent appearance of the internal carotid, middle and anterior cerebral arteries. No significant stenosis, proximal occlusion, aneurysm, or vascular malformation. Posterior circulation: Dominant right vertebral artery. Proximally patent right PICA. Short-segment defects  involving the proximal left PICA may reflect high-grade narrowing. Dominant right AICA. Patent normal caliber basilar artery. Patent normal caliber bilateral posterior cerebral arteries. The bilateral superior cerebellar arteries are proximally patent, diminutive on the left. No significant stenosis, proximal occlusion, aneurysm, or vascular malformation. Venous sinuses: Patent Anatomic variants: None of significance. MRA NECK FINDINGS There is no high-grade narrowing or focal aneurysm involving the bilateral carotid arteries. No evidence of dissection. The bilateral vertebral arteries are patent and demonstrate antegrade flow. Dominant right vertebral artery. No evidence of high-grade narrowing or focal aneurysm. IMPRESSION: No abnormal enhancement. This is suggestive of acute infarct involving the superior right cerebellum and vermis. High-grade short-segment narrowing involving the proximal left PICA. Otherwise unremarkable MRA head. Normal MRA neck. Electronically Signed   By: Stana Bunting M.D.   On: 03/17/2020 09:13   MR BRAIN WO CONTRAST  Result Date: 03/17/2020 CLINICAL DATA:  Neuro deficit EXAM: MRI HEAD WITHOUT CONTRAST TECHNIQUE: Multiplanar, multiecho pulse sequences of the brain and surrounding structures were obtained without intravenous contrast. COMPARISON:  None. FINDINGS: Brain: Focal restricted diffusion involving the superior right cerebellum and vermis with associated small foci of SWI signal dropout. No midline shift, ventriculomegaly or extra-axial fluid collection. No mass lesion. Vascular: Normal flow voids. Skull and upper cervical spine: Normal marrow signal. Sinuses/Orbits: Normal orbits. Clear paranasal sinuses. No mastoid effusion. Other: None. IMPRESSION: Restricted diffusion involving the superior right cerebellum and vermis. Likely an acute infarct however cannot exclude demyelination. Consider postcontrast imaging for further evaluation. These results were called by  telephone at the time of interpretation on 03/17/2020 at 7:30 am to provider [facility] , who verbally acknowledged these results. Electronically Signed   By: Stana Bunting M.D.   On: 03/17/2020 07:33   MR BRAIN W CONTRAST  Result Date: 03/17/2020 CLINICAL DATA:  Neuro deficit EXAM: MRI HEAD WITH CONTRAST MRA HEAD WITHOUT CONTRAST MRA NECK WITHOUT AND WITH CONTRAST TECHNIQUE: Multiplanar, multiecho pulse sequences of the brain and surrounding structures were obtained without intravenous contrast. Angiographic images of the head were obtained using MRA technique without contrast. Angiographic images of the neck were obtained using MRA technique with and without intravenous contrast. Carotid stenosis measurements (when applicable) are obtained utilizing NASCET criteria, using the distal internal carotid diameter as the denominator. CONTRAST:  9mL GADAVIST GADOBUTROL 1 MMOL/ML IV SOLN COMPARISON:  Prior same day MRI head. FINDINGS: MRI HEAD FINDINGS Brain: No abnormal enhancement within site of restricted diffusion involving the superior right cerebellum and vermis. No midline shift, ventriculomegaly or extra-axial fluid collection. No mass lesion. Vascular: Normal flow voids. Skull and upper cervical spine: Normal marrow signal. Sinuses/Orbits: Normal orbits. Aerated sinuses and mastoid air cells. Other: None. MRA HEAD FINDINGS Anterior circulation: Normal caliber patent appearance of the internal carotid, middle and anterior cerebral arteries. No significant stenosis, proximal occlusion, aneurysm, or vascular malformation. Posterior circulation: Dominant right  vertebral artery. Proximally patent right PICA. Short-segment defects involving the proximal left PICA may reflect high-grade narrowing. Dominant right AICA. Patent normal caliber basilar artery. Patent normal caliber bilateral posterior cerebral arteries. The bilateral superior cerebellar arteries are proximally patent, diminutive on the left. No  significant stenosis, proximal occlusion, aneurysm, or vascular malformation. Venous sinuses: Patent Anatomic variants: None of significance. MRA NECK FINDINGS There is no high-grade narrowing or focal aneurysm involving the bilateral carotid arteries. No evidence of dissection. The bilateral vertebral arteries are patent and demonstrate antegrade flow. Dominant right vertebral artery. No evidence of high-grade narrowing or focal aneurysm. IMPRESSION: No abnormal enhancement. This is suggestive of acute infarct involving the superior right cerebellum and vermis. High-grade short-segment narrowing involving the proximal left PICA. Otherwise unremarkable MRA head. Normal MRA neck. Electronically Signed   By: Stana Bunting M.D.   On: 03/17/2020 09:13   MR CERVICAL SPINE W WO CONTRAST  Result Date: 03/17/2020 CLINICAL DATA:  Demyelinating disease EXAM: MRI CERVICAL SPINE WITHOUT AND WITH CONTRAST TECHNIQUE: Multiplanar and multiecho pulse sequences of the cervical spine, to include the craniocervical junction and cervicothoracic junction, were obtained without and with intravenous contrast. CONTRAST:  73mL GADAVIST GADOBUTROL 1 MMOL/ML IV SOLN COMPARISON:  03/17/2020 MRI head without and with contrast. FINDINGS: Image quality is mildly degraded by motion artifact. Alignment: Straightening of cervical lordosis. Vertebrae: Normal bone marrow signal intensity. No focal osseous lesion. No abnormal enhancement. Cord: Normal signal and morphology.  No abnormal enhancement. Posterior Fossa, vertebral arteries: Please see concurrent MRI head. Disc levels: Mild C5-6 desiccation.  Disc spaces are preserved. C2-3: No significant disc bulge, spinal canal or neural foraminal narrowing. C3-4: Small disc osteophyte complex with uncovertebral degenerative spurring. Patent spinal canal and neural foramen. C4-5: No significant disc bulge, spinal canal or neural foraminal narrowing. C5-6: No significant disc bulge, spinal canal or  neural foraminal narrowing. C6-7: No significant disc bulge, spinal canal or neural foraminal narrowing. C7-T1: No significant disc bulge, spinal canal or neural foraminal narrowing. Paraspinal tissues: Negative. IMPRESSION: No evidence of demyelination or myelopathy. No significant spinal canal or neural foraminal narrowing. Electronically Signed   By: Stana Bunting M.D.   On: 03/17/2020 09:28   VAS [place] TRANSCRANIAL DOPPLER W BUBBLES  Result Date: 03/17/2020  Transcranial Doppler with Bubble Indications: Stroke. Comparison Study: No prior studies. Performing Technologist: Chanda Busing RVT  Examination Guidelines: A complete evaluation includes B-mode imaging, spectral Doppler, color Doppler, and power Doppler as needed of all accessible portions of each vessel. Bilateral testing is considered an integral part of a complete examination. Limited examinations for reoccurring indications may be performed as noted.  Summary:  A vascular evaluation was performed. The right middle cerebral artery was studied. An IV was inserted into the patient's left Cephalic vein. Verbal informed consent was obtained.  No HITS detected during rest or valsalva maneuver. Negative for PFO. *See table(s) above for TCD measurements and observations.    Preliminary    ECHOCARDIOGRAM COMPLETE BUBBLE STUDY  Result Date: 03/17/2020    ECHOCARDIOGRAM REPORT   Patient Name:   Jackson Terrell Date of Exam: 03/17/2020 Medical Rec #:  [id number]           Height:       68.0 in Accession #:    [id number]          Weight:       200.0 lb Date of Birth:  Sep 15, 1990           BSA:  2.044 m Patient Age:    29 years            BP:           150/102 mmHg Patient Gender: M                   HR:           90 bpm. Exam Location:  Inpatient Procedure: 2D Echo, Cardiac Doppler, Color Doppler and Saline Contrast Bubble            Study Indications:    Stroke  History:        Patient has no prior history of Echocardiogram examinations.                  Signs/Symptoms:Dizziness/Lightheadedness.  Sonographer:    Sheralyn Boatman RDCS Referring Phys: [id number] [facility] IMPRESSIONS  1. Left ventricular ejection fraction, by estimation, is 60 to 65%. The left ventricle has normal function. The left ventricle has no regional wall motion abnormalities. There is moderate left ventricular hypertrophy. Left ventricular diastolic parameters were normal.  2. Right ventricular systolic function is normal. The right ventricular size is normal.  3. Left atrial size was mildly dilated.  4. The mitral valve is normal in structure. Trivial mitral valve regurgitation. No evidence of mitral stenosis.  5. The aortic valve is normal in structure. Aortic valve regurgitation is not visualized. No aortic stenosis is present.  6. The inferior vena cava is normal in size with greater than 50% respiratory variability, suggesting right atrial pressure of 3 mmHg. FINDINGS  Left Ventricle: Left ventricular ejection fraction, by estimation, is 60 to 65%. The left ventricle has normal function. The left ventricle has no regional wall motion abnormalities. The left ventricular internal cavity size was normal in size. There is  moderate left ventricular hypertrophy. Left ventricular diastolic parameters were normal. Right Ventricle: The right ventricular size is normal. No increase in right ventricular wall thickness. Right ventricular systolic function is normal. Left Atrium: Left atrial size was mildly dilated. Right Atrium: Right atrial size was normal in size. Pericardium: There is no evidence of pericardial effusion. Mitral Valve: The mitral valve is normal in structure. Normal mobility of the mitral valve leaflets. Trivial mitral valve regurgitation. No evidence of mitral valve stenosis. Tricuspid Valve: The tricuspid valve is normal in structure. Tricuspid valve regurgitation is not demonstrated. No evidence of tricuspid stenosis. Aortic Valve: The aortic valve is normal in  structure. Aortic valve regurgitation is not visualized. No aortic stenosis is present. Pulmonic Valve: The pulmonic valve was normal in structure. Pulmonic valve regurgitation is not visualized. No evidence of pulmonic stenosis. Aorta: The aortic root is normal in size and structure. Venous: The inferior vena cava is normal in size with greater than 50% respiratory variability, suggesting right atrial pressure of 3 mmHg. IAS/Shunts: No atrial level shunt detected by color flow Doppler. Agitated saline contrast was given intravenously to evaluate for intracardiac shunting.  LEFT VENTRICLE PLAX 2D LVIDd:         4.50 cm     Diastology LVIDs:         2.80 cm     LV e' lateral:   16.40 cm/s LV PW:         1.40 cm     LV E/e' lateral: 2.3 LV IVS:        1.60 cm     LV e' medial:    7.29 cm/s LVOT diam:  2.10 cm     LV E/e' medial:  5.2 LV SV:         62 LV SV Index:   30 LVOT Area:     3.46 cm  LV Volumes (MOD) LV vol d, MOD A2C: 86.9 ml LV vol d, MOD A4C: 88.2 ml LV vol s, MOD A2C: 29.4 ml LV vol s, MOD A4C: 30.5 ml LV SV MOD A2C:     57.5 ml LV SV MOD A4C:     88.2 ml LV SV MOD BP:      63.6 ml RIGHT VENTRICLE             IVC RV S prime:     16.40 cm/s  IVC diam: 2.40 cm TAPSE (M-mode): 3.0 cm LEFT ATRIUM             Index       RIGHT ATRIUM           Index LA diam:        3.80 cm 1.86 cm/m  RA Area:     15.40 cm LA Vol (A2C):   34.8 ml 17.03 ml/m RA Volume:   37.10 ml  18.15 ml/m LA Vol (A4C):   35.9 ml 17.57 ml/m LA Biplane Vol: 35.6 ml 17.42 ml/m  AORTIC VALVE LVOT Vmax:   91.20 cm/s LVOT Vmean:  60.900 cm/s LVOT VTI:    0.179 m  AORTA Ao Root diam: 3.80 cm Ao Asc diam:  3.40 cm MITRAL VALVE MV Area (PHT): 2.95 cm    SHUNTS MV Decel Time: 257 msec    Systemic VTI:  0.18 m MV E velocity: 37.60 cm/s  Systemic Diam: 2.10 cm MV A velocity: 57.60 cm/s MV E/A ratio:  0.65 Charlton Haws MD Electronically signed by Charlton Haws MD Signature Date/Time: 03/17/2020/10:24:39 AM    Final      Assessment/Plan: Diagnosis: Acute superior right cerebellar stroke 1. Does the need for close, 24 hr/day medical supervision in concert with the patient's rehab needs make it unreasonable for this patient to be served in a less intensive setting? Yes 2. Co-Morbidities requiring supervision/potential complications: HTN, depression, dizziness, nausea, vomiting, myalgias 3. Due to bladder management, bowel management, safety, skin/wound care, disease management, medication administration, pain management and patient education, does the patient require 24 hr/day rehab nursing? Yes 4. Does the patient require coordinated care of a physician, rehab nurse, therapy disciplines of PT, OT to address physical and functional deficits in the context of the above medical diagnosis(es)? Yes Addressing deficits in the following areas: balance, endurance, locomotion, strength, transferring, bowel/bladder control, bathing, dressing, feeding, grooming, toileting and psychosocial support 5. Can the patient actively participate in an intensive therapy program of at least 3 hrs of therapy per day at least 5 days per week? Yes 6. The potential for patient to make measurable gains while on inpatient rehab is excellent 7. Anticipated functional outcomes upon discharge from inpatient rehab are modified independent  with PT, modified independent with OT, independent with SLP. 8. Estimated rehab length of stay to reach the above functional goals is: 7-8 days 9. Anticipated discharge destination: Home 10. Overall Rehab/Functional Prognosis: excellent  RECOMMENDATIONS: This patient's condition is appropriate for continued rehabilitative care in the following setting: CIR Patient has agreed to participate in recommended program. Yes Note that insurance prior authorization may be required for reimbursement for recommended care.  Comment: Thank you for this consult. Admission coordinator to follow.   I have personally  performed a face  to face diagnostic evaluation, including, but not limited to relevant history and physical exam findings, of this patient and developed relevant assessment and plan.  Additionally, I have reviewed and concur with the physician assistant's documentation above.  Sula Soda, MD  Mcarthur Rossetti Angiulli, PA-C 03/18/2020

## 2020-03-18 NOTE — CV Procedure (Signed)
TEE with 3D rendering Anesthesia:  Propofol  Normal TEE No LAA thrombus No ASD/PFO Negative bubble study No SOE  See full report in [facility]  Charlton Haws MD [facility]

## 2020-03-18 NOTE — Progress Notes (Signed)
°  Echocardiogram Echocardiogram Transesophageal has been performed.  Lashuna Tamashiro G Avantika Shere 03/18/2020, 10:04 AM

## 2020-03-18 NOTE — Progress Notes (Signed)
Occupational Therapy Treatment Patient Details Name: Jackson Terrell MRN: [id number] DOB: [birth year] Today's Date: 03/18/2020    History of present illness The pt is a 29 yo male presenting with c/o nausea and vomiting, and noted to have diffuse hyperreflexia. 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   OT comments  Pt progressing well.  He is able to perform ADLs with min guard assist using RW.  He remains guarded due to dizziness, but reports it's no greater than 3-4/10.  Mother is very supportive and will stay with him at discharge.  His apartment complex has an elevator which will allow for safe access.  Recommendation changed to OPOT.  Will continue to follow.   Follow Up Recommendations  Outpatient OT    Equipment Recommendations  3 in 1 bedside commode;Tub/shower bench    Recommendations for Other Services      Precautions / Restrictions Precautions Precautions: Fall       Mobility Bed Mobility Overal bed mobility: Modified Independent                Transfers Overall transfer level: Needs assistance Equipment used: Rolling walker (2 wheeled);1 person hand held assist Transfers: Sit to/from [employer] Sit to Stand: Min guard Stand pivot transfers: Min guard            Balance Overall balance assessment: Needs assistance Sitting-balance support: Feet supported;No upper extremity supported Sitting balance-Leahy Scale: Good     Standing balance support: No upper extremity supported Standing balance-Leahy Scale: Fair Standing balance comment: able to maintain static standing with min guard assist without UE support                            ADL either performed or assessed with clinical judgement   ADL Overall ADL's : Needs assistance/impaired     Grooming: Wash/dry hands;Wash/dry face;Oral care;Min guard;Standing Grooming Details (indicate cue type and reason): pt requires  encouragement to attempt set up on his own      Lower Body Bathing: Min guard;Sit to/from stand           Toilet Transfer: Min guard;Ambulation;Comfort height toilet   Toileting- Clothing Manipulation and Hygiene: Min guard;Sit to/from stand       Functional mobility during ADLs: Min guard;Rolling walker       Vision   Additional Comments: Pt continues to close Lt eye, but adamantly denies diplopia..  Dizziness present with transitions    Perception     Praxis      Cognition Arousal/Alertness: Awake/alert Behavior During Therapy: Flat affect;WFL for tasks assessed/performed Overall Cognitive Status: Within Functional Limits for tasks assessed                                          Exercises Other Exercises Other Exercises: continue to reinforce gaze fixation strategies to reduce dizziness    Shoulder Instructions       General Comments mother present and very supportive.  She reports pt's apartment complex has an Engineer, structural available     Pertinent Vitals/ Pain       Pain Assessment: No/denies pain  Home Living  Prior Functioning/Environment              Frequency  Min 2X/week        Progress Toward Goals  OT Goals(current goals can now be found in the care plan section)  Progress towards OT goals: Progressing toward goals     Plan Discharge plan needs to be updated;Equipment recommendations need to be updated    Co-evaluation                 AM-PAC OT "6 Clicks" Daily Activity     Outcome Measure   Help from another person eating meals?: None Help from another person taking care of personal grooming?: A Little Help from another person toileting, which includes using toliet, bedpan, or urinal?: A Little Help from another person bathing (including washing, rinsing, drying)?: A Little Help from another person to put on and taking off regular upper body  clothing?: A Little Help from another person to put on and taking off regular lower body clothing?: A Little 6 Click Score: 19    End of Session Equipment Utilized During Treatment: Gait belt;Rolling walker  OT Visit Diagnosis: Unsteadiness on feet (R26.81);Dizziness and giddiness (R42)   Activity Tolerance Patient tolerated treatment well   Patient Left in chair;with call bell/phone within reach;with family/visitor present   Nurse Communication Mobility status        Time: 4854-6270 OT Time Calculation (min): 50 min  Charges: OT General Charges $OT Visit: 1 Visit OT Treatments $Self Care/Home Management : 38-52 mins  Eber Jones., OTR/L Acute Rehabilitation Services Pager [phone] Office [phone]    Jeani Hawking M 03/18/2020, 2:57 PM

## 2020-03-18 NOTE — Progress Notes (Signed)
Physical Therapy Treatment Patient Details Name: Jackson Terrell MRN: [id number] DOB: 1990-08-23 Today's Date: 03/18/2020    History of Present Illness The pt is a 29 yo male presenting with c/o nausea and vomiting, and noted to have diffuse hyperreflexia. 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    PT Comments    Pt progressing well with mobility, tolerating hallway distance ambulation with use of RW and accepting moderate dynamic standing balance challenges. Pt scored 14/24 on DGI, indicating pt is at increased risk of falls, but mobilizes well with use of RW and will have mother to assist 24/7 on d/c. Pt ambulating 400+ feet in hallway today, at min guard to supervision level for all mobility when using AD. PT recommending OPPT to address higher level balance deficits, mild incoordination, and weakness. Pt eager to d/c home and is excited about prospect of home with OPPT. PT To continue to follow acutely.    Follow Up Recommendations  Outpatient PT     Equipment Recommendations  Rolling walker with 5" wheels    Recommendations for Other Services       Precautions / Restrictions Precautions Precautions: Fall Restrictions Other Position/Activity Restrictions: permissive HTN <220/<110 for 24 hours (start 8:00 AM 9/2)    Mobility  Bed Mobility Overal bed mobility: Needs Assistance             General bed mobility comments: Pt up in chair   Transfers Overall transfer level: Needs assistance Equipment used: Rolling walker (2 wheeled) Transfers: Sit to/from Stand Sit to Stand: Min guard Stand pivot transfers: Min guard       General transfer comment: for safety, increased time to rise and steady. Correct hand placement when rising/sitting.  Ambulation/Gait Ambulation/Gait assistance: Min guard;Min assist Gait Distance (Feet): 400 Feet Assistive device: Rolling walker (2 wheeled);None Gait Pattern/deviations: Step-through  pattern;Decreased stride length;Wide base of support;Trunk flexed Gait velocity: decr   General Gait Details: min guard for safety with use of RW, with periods of supervision only when not challenging pt balance. Pt ambulated x25 ft without AD, pt with LOB x2 and significant unsteadiness compensated with widening BOS. Pt tolerated balance challenge well, with dizziness 2-3/10 with use of "distance" glasses.   Stairs             Wheelchair Mobility    Modified Rankin (Stroke Patients Only) Modified Rankin (Stroke Patients Only) Pre-Morbid Rankin Score: No symptoms Modified Rankin: Moderate disability     Balance Overall balance assessment: Needs assistance Sitting-balance support: Feet supported;No upper extremity supported Sitting balance-Leahy Scale: Good     Standing balance support: No upper extremity supported Standing balance-Leahy Scale: Fair Standing balance comment: able to ambulate short distance without AD, RW improves pt steadiness                 Standardized Balance Assessment Standardized Balance Assessment : Dynamic Gait Index   Dynamic Gait Index Level Surface: Mild Impairment Change in Gait Speed: Mild Impairment Gait with Horizontal Head Turns: Moderate Impairment Gait with Vertical Head Turns: Mild Impairment Gait and Pivot Turn: Normal Step Over Obstacle: Moderate Impairment Step Around Obstacles: Mild Impairment Steps: Moderate Impairment (not tested, inferred) Total Score: 14      Cognition Arousal/Alertness: Awake/alert Behavior During Therapy: Flat affect;WFL for tasks assessed/performed Overall Cognitive Status: Within Functional Limits for tasks assessed  General Comments: increased processing time, pt reporting fatigue      Exercises Other Exercises Other Exercises: continue to reinforce gaze fixation strategies to reduce dizziness     General Comments General comments (skin  integrity, edema, etc.): mother present and very supportive.  She reports pt's apartment complex has an Engineer, structural available       Pertinent Vitals/Pain Pain Assessment: No/denies pain Pain Intervention(s): Monitored during session    Home Living                      Prior Function            PT Goals (current goals can now be found in the care plan section) Acute Rehab PT Goals Patient Stated Goal: return to independence PT Goal Formulation: With patient Time For Goal Achievement: 03/31/20 Potential to Achieve Goals: Good Progress towards PT goals: Progressing toward goals    Frequency    Min 4X/week      PT Plan Discharge plan needs to be updated    Co-evaluation              AM-PAC PT "6 Clicks" Mobility   Outcome Measure  Help needed turning from your back to your side while in a flat bed without using bedrails?: None Help needed moving from lying on your back to sitting on the side of a flat bed without using bedrails?: A Little Help needed moving to and from a bed to a chair (including a wheelchair)?: A Little Help needed standing up from a chair using your arms (e.g., wheelchair or bedside chair)?: A Little Help needed to walk in hospital room?: A Little Help needed climbing 3-5 steps with a railing? : A Lot 6 Click Score: 18    End of Session Equipment Utilized During Treatment: Gait belt Activity Tolerance: Patient tolerated treatment well;Patient limited by fatigue;Other (comment) (elevated BP over permissive HTN) Patient left: in chair;with call bell/phone within reach;with family/visitor present Nurse Communication: Mobility status (pt's mother requesting to speak with MD) PT Visit Diagnosis: Unsteadiness on feet (R26.81);Difficulty in walking, not elsewhere classified (R26.2)     Time: 2122-4825 PT Time Calculation (min) (ACUTE ONLY): 21 min  Charges:  $Neuromuscular Re-education: 8-22 mins                    Diamante Truszkowski E, PT Acute  Rehabilitation Services Pager [phone]  Office [phone]    Ingri Diemer D Despina Hidden 03/18/2020, 3:41 PM

## 2020-03-19 DIAGNOSIS — I6310 Cerebral infarction due to embolism of unspecified precerebral artery: Secondary | ICD-10-CM | POA: Diagnosis not present

## 2020-03-19 DIAGNOSIS — F329 Major depressive disorder, single episode, unspecified: Secondary | ICD-10-CM | POA: Diagnosis not present

## 2020-03-19 LAB — CBC WITH DIFFERENTIAL/PLATELET
Abs Immature Granulocytes: 0.02 10*3/uL (ref 0.00–0.07)
Basophils Absolute: 0.1 10*3/uL (ref 0.0–0.1)
Basophils Relative: 1 %
Eosinophils Absolute: 0.1 10*3/uL (ref 0.0–0.5)
Eosinophils Relative: 1 %
HCT: 41.7 % (ref 39.0–52.0)
Hemoglobin: 13.9 g/dL (ref 13.0–17.0)
Immature Granulocytes: 0 %
Lymphocytes Relative: 21 %
Lymphs Abs: 1.9 10*3/uL (ref 0.7–4.0)
MCH: 28.4 pg (ref 26.0–34.0)
MCHC: 33.3 g/dL (ref 30.0–36.0)
MCV: 85.1 fL (ref 80.0–100.0)
Monocytes Absolute: 0.8 10*3/uL (ref 0.1–1.0)
Monocytes Relative: 9 %
Neutro Abs: 6.4 10*3/uL (ref 1.7–7.7)
Neutrophils Relative %: 68 %
Platelets: 247 10*3/uL (ref 150–400)
RBC: 4.9 MIL/uL (ref 4.22–5.81)
RDW: 12.9 % (ref 11.5–15.5)
WBC: 9.3 10*3/uL (ref 4.0–10.5)
nRBC: 0 % (ref 0.0–0.2)

## 2020-03-19 LAB — FANA STAINING PATTERNS: Homogeneous Pattern: 1 — ABNORMAL HIGH

## 2020-03-19 LAB — PROTEIN C, TOTAL: Protein C, Total: 122 % (ref 60–150)

## 2020-03-19 LAB — BASIC METABOLIC PANEL
Anion gap: 9 (ref 5–15)
BUN: <5 mg/dL — ABNORMAL LOW (ref 6–20)
CO2: 24 mmol/L (ref 22–32)
Calcium: 8.9 mg/dL (ref 8.9–10.3)
Chloride: 104 mmol/L (ref 98–111)
Creatinine, Ser: 0.78 mg/dL (ref 0.61–1.24)
GFR calc Af Amer: >60 mL/min (ref >60)
GFR calc non Af Amer: >60 mL/min (ref >60)
Glucose, Bld: 97 mg/dL (ref 70–99)
Potassium: 3.2 mmol/L — ABNORMAL LOW (ref 3.5–5.1)
Sodium: 137 mmol/L (ref 135–145)

## 2020-03-19 LAB — PROTEIN S, TOTAL: Protein S Ag, Total: 77 % (ref 60–150)

## 2020-03-19 LAB — PROTEIN S ACTIVITY: Protein S Activity: 85 % (ref 63–140)

## 2020-03-19 LAB — ANTINUCLEAR ANTIBODIES, IFA: ANA Ab, IFA: POSITIVE — AB

## 2020-03-19 LAB — PROTEIN C ACTIVITY: Protein C Activity: 136 % (ref 73–180)

## 2020-03-19 LAB — LUPUS ANTICOAGULANT PANEL
DRVVT: 31.3 s (ref 0.0–47.0)
PTT Lupus Anticoagulant: 31.8 s (ref 0.0–51.9)

## 2020-03-19 MED ORDER — POTASSIUM CHLORIDE 20 MEQ PO PACK
40.0000 meq | PACK | Freq: Once | ORAL | Status: AC
  Administered 2020-03-19: 40 meq via ORAL
  Filled 2020-03-19: qty 2

## 2020-03-19 MED ORDER — SERTRALINE HCL 100 MG PO TABS: 100.0000 mg | ORAL_TABLET | Freq: Every day | ORAL | 0 refills | Status: AC

## 2020-03-19 MED ORDER — CLOPIDOGREL BISULFATE 75 MG PO TABS: 75.0000 mg | ORAL_TABLET | Freq: Every day | ORAL | 0 refills | Status: AC

## 2020-03-19 MED ORDER — ASPIRIN 81 MG PO TBEC: 81.0000 mg | DELAYED_RELEASE_TABLET | Freq: Every day | ORAL | 0 refills | Status: AC

## 2020-03-19 MED ORDER — ATORVASTATIN CALCIUM 80 MG PO TABS: 80.0000 mg | ORAL_TABLET | Freq: Every day | ORAL | 0 refills | Status: DC

## 2020-03-19 NOTE — Progress Notes (Addendum)
Patient ready for discharge to home; discharge instructions given and reviewed;Rx's sent electronically; patient dressed and ready to leave; equipment provided at bedside; patient discharged out via wheelchair accompanied home by his mother.  Equipment sent with patient; bedside commode.

## 2020-03-19 NOTE — Progress Notes (Signed)
Occupational Therapy Treatment Patient Details Name: Jackson Terrell MRN: [id number] DOB: Feb 04, 1991 Today's Date: 03/19/2020    History of present illness The pt is a 29 yo male presenting with c/o nausea and vomiting, and noted to have diffuse hyperreflexia. 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   OT comments  Pt instructed in safe technique for tub transfer using 3in1 commode and was able to return demonstration with min guard assist.  He is able to perform ADLs with min guard assist.  Dizziness 2-3/10 with activity.  No nystagmus noted this date.   Follow Up Recommendations  Outpatient OT    Equipment Recommendations  3 in 1 bedside commode;Tub/shower bench    Recommendations for Other Services      Precautions / Restrictions Precautions Precautions: Fall       Mobility Bed Mobility Overal bed mobility: Modified Independent                Transfers Overall transfer level: Needs assistance Equipment used: Rolling walker (2 wheeled) Transfers: Sit to/from [employer] Sit to Stand: Min guard Stand pivot transfers: Min guard            Balance Overall balance assessment: Needs assistance Sitting-balance support: Feet supported Sitting balance-Leahy Scale: Good     Standing balance support: No upper extremity supported Standing balance-Leahy Scale: Fair                             ADL either performed or assessed with clinical judgement   ADL Overall ADL's : Needs assistance/impaired                     Lower Body Dressing: Min guard;Sit to/from stand           Tub/ Shower Transfer: Min guard;Ambulation;3 in 1;Rolling walker Tub/Shower Transfer Details (indicate cue type and reason): Pt and mother instructed how to use 3in1 as a shower seat and pt able to return demonstration with min guard assist.  Discussed acquiring a HH shower for home use          Vision    Additional Comments: Pt continues to report dizziness 2-3/10 with transitions.  No nystagmus noted this date.     Perception     Praxis      Cognition Arousal/Alertness: Awake/alert Behavior During Therapy: WFL for tasks assessed/performed Overall Cognitive Status: Within Functional Limits for tasks assessed                                          Exercises     Shoulder Instructions       General Comments all questions answered     Pertinent Vitals/ Pain       Pain Assessment: No/denies pain  Home Living                                          Prior Functioning/Environment              Frequency  Min 2X/week        Progress Toward Goals  OT Goals(current goals can now be found in the care plan section)  Progress towards OT goals: Progressing toward goals  Plan Discharge plan remains appropriate    Co-evaluation                 AM-PAC OT "6 Clicks" Daily Activity     Outcome Measure   Help from another person eating meals?: None Help from another person taking care of personal grooming?: A Little Help from another person toileting, which includes using toliet, bedpan, or urinal?: A Little Help from another person bathing (including washing, rinsing, drying)?: A Little Help from another person to put on and taking off regular upper body clothing?: A Little Help from another person to put on and taking off regular lower body clothing?: A Little 6 Click Score: 19    End of Session Equipment Utilized During Treatment: Gait belt;Rolling walker  OT Visit Diagnosis: Unsteadiness on feet (R26.81);Dizziness and giddiness (R42)   Activity Tolerance Patient tolerated treatment well   Patient Left in bed;with call bell/phone within reach;with family/visitor present   Nurse Communication Need for lift equipment        Time: [phone] OT Time Calculation (min): 20 min  Charges: OT General Charges $OT  Visit: 1 Visit OT Treatments $Self Care/Home Management : 8-22 mins  Eber Jones OTR/L Acute Rehabilitation Services Pager [phone] Office [phone]    Jeani Hawking M 03/19/2020, 9:44 AM

## 2020-03-19 NOTE — Discharge Summary (Signed)
Discharge Summary  IVOR KISHI RSW:[id number] DOB: 05/29/1991  PCP: [facility]  Admit date: 03/15/2020 Discharge date: 03/19/2020  Time spent: 40 mins  Recommendations for Outpatient Follow-up:  1. PCP in 1 week 2. Psychiatry in 1 week 3. Neurology in 4 weeks   Discharge Diagnoses:  Active Hospital Problems   Diagnosis Date Noted  . 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 Depression 03/15/2020    Resolved Hospital Problems  No resolved problems to display.    Discharge Condition: Stable  Diet recommendation: Heart healthy  Vitals:   03/19/20 0400 03/19/20 0741  BP: (!) 154/99 (!) 147/91  Pulse: 75 72  Resp: 18 20  Temp: 98.3 F (36.8 C) 97.9 F (36.6 C)  SpO2: 94% 95%    History of present illness:  Graceson Nichelson Campbellis a 29 y.o.malewith medical history significant fordepression who presents with concerns of nausea, vomiting and dizziness. Denies any diarrhea abdominal pain. Reports that he has been taking his sertraline at 300 mg for the past year since he did not feel like the lower dose was working. Denies any suicidal ideation but that he is just "stressed." Denies any tobacco use. Has occasional alcohol use. States he has not used marijuana in over a year. ED Course:He was diaphoretic, mildly tachycardic and hypertensive up to 170s. He was noted to have sustained hyperreflexia of the patella reflex. Neurology was consulted by ED physician for concerns of serotonin syndrome. Neurology has evaluated bedside and recommends as needed Ativan and hospitalist admission for serotonin syndrome.    Today, patient denies any new complaints, reports feeling better, denies any further nausea/vomiting, abdominal pain, chest pain, dizziness, fever/chills.  Discussed discharge plans extensively with patient and mother at bedside.    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, no significant spinal canal or neural foraminal narrowing LDL 96, A1c 5.4 Hypercoagulable studies pending, PCP to follow up TTE with EF of 60 to 65%, no regional wall motion abnormality, negative for intracardiac shunting TEE done on 03/18/2020 was normal, no LAA thrombus, no ASD/PFO, negative bubble study Neurology consulted, recommend aspirin and Plavix for 3 weeks, then aspirin alone Continue aspirin, Lipitor  Obesity Lifestyle modification advised  Depression Psychiatry consulted, recommend outpatient follow-up and holding sertraline, may resume sertraline at 100 mg daily on 03/21/2020 Follow-up with outpatient psychiatry         Malnutrition Type:      Malnutrition Characteristics:      Nutrition Interventions:      Estimated body mass index is 30.41 kg/m as calculated from the following:   Height as of this encounter: 5\' 8"  (1.727 m).   Weight as of this encounter: 90.7 kg.    Procedures:  TEE on 03/18/2020  Consultations:  Neurology  Cardiology  Discharge Exam: BP (!) 147/91 (BP Location: Right Arm)   Pulse 72   Temp 97.9 F (36.6 C) (Oral)   Resp 20   Ht 5\' 8"  (1.727 m)   Wt 90.7 kg   SpO2 95%   BMI  30.41 kg/m   General: NAD Cardiovascular: S1, S2 present Respiratory: CTA B Neurology: Strength equal in all extremities, sensation intact, no other obvious focal neurologic deficits noted     Discharge Instructions You were cared for by a  hospitalist during your hospital stay. If you have any questions about your discharge medications or the care you received while you were in the hospital after you are discharged, you can call the unit and asked to speak with the hospitalist on call if the hospitalist that took care of you is not available. Once you are discharged, your primary care physician will handle any further medical issues. Please note that NO REFILLS for any discharge medications will be authorized once you are discharged, as it is imperative that you return to your primary care physician (or establish a relationship with a primary care physician if you do not have one) for your aftercare needs so that they can reassess your need for medications and monitor your lab values.  Discharge Instructions    Ambulatory referral to Occupational Therapy   Complete by: As directed    Please call mother for appt: Lesly Dukes [phone]   Ambulatory referral to Physical Therapy   Complete by: As directed    Please call mother for appt: Lesly Dukes [phone]   Diet - low sodium heart healthy   Complete by: As directed    Increase activity slowly   Complete by: As directed      Allergies as of 03/19/2020   No Known Allergies     Medication List    STOP taking these medications   imiquimod 5 % cream Commonly known as: Aldara     TAKE these medications   aspirin 81 MG EC tablet Take 1 tablet (81 mg total) by mouth daily. Swallow whole. Start taking on: March 20, 2020   atorvastatin 80 MG tablet Commonly known as: LIPITOR Take 1 tablet (80 mg total) by mouth daily. Start taking on: March 20, 2020   clopidogrel 75 MG tablet Commonly known as: PLAVIX Take 1 tablet (75 mg total) by mouth daily for 19 days. Start taking on: March 20, 2020   sertraline 100 MG tablet Commonly known as: ZOLOFT Take 1 tablet (100 mg total) by mouth daily. Start taking on: March 21, 2020 What changed:   how much to  take  These instructions start on March 21, 2020. If you are unsure what to do until then, ask your doctor or other care provider.            Durable Medical Equipment  (From admission, onward)         Start     Ordered   03/18/20 1533  For home use only DME 3 n 1  Once        03/18/20 1532   03/18/20 1532  For home use only DME Walker rolling  Once       Question Answer Comment  Walker: With 5 Inch Wheels   Patient needs a walker to treat with the following condition Weakness      03/18/20 1532         No Known Allergies  Follow-up Information    [facility].   Specialty: Rehabilitation Why: The outpatient therapy will contact you for the first appointment Contact information: [address] [phone] [place]c [place] [address] [phone]       [facility]. Schedule an appointment as soon  as possible for a visit in 1 week(s).   Contact information: [address] [place] [address] [phone]        [facility]. Schedule an appointment as soon as possible for a visit in 4 week(s).   Specialty: Neurology Contact information: [address] [place] [address] [phone]               The results of significant diagnostics from this hospitalization (including imaging, microbiology, ancillary and laboratory) are listed below for reference.    Significant Diagnostic Studies: MR ANGIO HEAD WO CONTRAST  Result Date: 03/17/2020 CLINICAL DATA:  Neuro deficit EXAM: MRI HEAD WITH CONTRAST MRA HEAD WITHOUT CONTRAST MRA NECK WITHOUT AND WITH CONTRAST TECHNIQUE: Multiplanar, multiecho pulse sequences of the brain and surrounding structures were obtained without intravenous contrast. Angiographic images of the head were obtained using MRA technique without contrast. Angiographic images of the neck were  obtained using MRA technique with and without intravenous contrast. Carotid stenosis measurements (when applicable) are obtained utilizing NASCET criteria, using the distal internal carotid diameter as the denominator. CONTRAST:  66mL GADAVIST GADOBUTROL 1 MMOL/ML IV SOLN COMPARISON:  Prior same day MRI head. FINDINGS: MRI HEAD FINDINGS Brain: No abnormal enhancement within site of restricted diffusion involving the superior right cerebellum and vermis. No midline shift, ventriculomegaly or extra-axial fluid collection. No mass lesion. Vascular: Normal flow voids. Skull and upper cervical spine: Normal marrow signal. Sinuses/Orbits: Normal orbits. Aerated sinuses and mastoid air cells. Other: None. MRA HEAD FINDINGS Anterior circulation: Normal caliber patent appearance of the internal carotid, middle and anterior cerebral arteries. No significant stenosis, proximal occlusion, aneurysm, or vascular malformation. Posterior circulation: Dominant right vertebral artery. Proximally patent right PICA. Short-segment defects involving the proximal left PICA may reflect high-grade narrowing. Dominant right AICA. Patent normal caliber basilar artery. Patent normal caliber bilateral posterior cerebral arteries. The bilateral superior cerebellar arteries are proximally patent, diminutive on the left. No significant stenosis, proximal occlusion, aneurysm, or vascular malformation. Venous sinuses: Patent Anatomic variants: None of significance. MRA NECK FINDINGS There is no high-grade narrowing or focal aneurysm involving the bilateral carotid arteries. No evidence of dissection. The bilateral vertebral arteries are patent and demonstrate antegrade flow. Dominant right vertebral artery. No evidence of high-grade narrowing or focal aneurysm. IMPRESSION: No abnormal enhancement. This is suggestive of acute infarct involving the superior right cerebellum and vermis. High-grade short-segment narrowing involving the proximal left  PICA. Otherwise unremarkable MRA head. Normal MRA neck. Electronically Signed   By: Stana Bunting M.D.   On: 03/17/2020 09:13   MR ANGIO NECK W WO CONTRAST  Result Date: 03/17/2020 CLINICAL DATA:  Neuro deficit EXAM: MRI HEAD WITH CONTRAST MRA HEAD WITHOUT CONTRAST MRA NECK WITHOUT AND WITH CONTRAST TECHNIQUE: Multiplanar, multiecho pulse sequences of the brain and surrounding structures were obtained without intravenous contrast. Angiographic images of the head were obtained using MRA technique without contrast. Angiographic images of the neck were obtained using MRA technique with and without intravenous contrast. Carotid stenosis measurements (when applicable) are obtained utilizing NASCET criteria, using the distal internal carotid diameter as the denominator. CONTRAST:  71mL GADAVIST GADOBUTROL 1 MMOL/ML IV SOLN COMPARISON:  Prior same day MRI head. FINDINGS: MRI HEAD FINDINGS Brain: No abnormal enhancement within site of restricted diffusion involving the superior right cerebellum and vermis. No midline shift, ventriculomegaly or extra-axial fluid collection. No mass lesion. Vascular: Normal flow voids. Skull and upper cervical spine: Normal marrow signal. Sinuses/Orbits: Normal orbits. Aerated sinuses and mastoid  air cells. Other: None. MRA HEAD FINDINGS Anterior circulation: Normal caliber patent appearance of the internal carotid, middle and anterior cerebral arteries. No significant stenosis, proximal occlusion, aneurysm, or vascular malformation. Posterior circulation: Dominant right vertebral artery. Proximally patent right PICA. Short-segment defects involving the proximal left PICA may reflect high-grade narrowing. Dominant right AICA. Patent normal caliber basilar artery. Patent normal caliber bilateral posterior cerebral arteries. The bilateral superior cerebellar arteries are proximally patent, diminutive on the left. No significant stenosis, proximal occlusion, aneurysm, or vascular  malformation. Venous sinuses: Patent Anatomic variants: None of significance. MRA NECK FINDINGS There is no high-grade narrowing or focal aneurysm involving the bilateral carotid arteries. No evidence of dissection. The bilateral vertebral arteries are patent and demonstrate antegrade flow. Dominant right vertebral artery. No evidence of high-grade narrowing or focal aneurysm. IMPRESSION: No abnormal enhancement. This is suggestive of acute infarct involving the superior right cerebellum and vermis. High-grade short-segment narrowing involving the proximal left PICA. Otherwise unremarkable MRA head. Normal MRA neck. Electronically Signed   By: Stana Buntinghikanele  Emekauwa M.D.   On: 03/17/2020 09:13   MR BRAIN WO CONTRAST  Result Date: 03/17/2020 CLINICAL DATA:  Neuro deficit EXAM: MRI HEAD WITHOUT CONTRAST TECHNIQUE: Multiplanar, multiecho pulse sequences of the brain and surrounding structures were obtained without intravenous contrast. COMPARISON:  None. FINDINGS: Brain: Focal restricted diffusion involving the superior right cerebellum and vermis with associated small foci of SWI signal dropout. No midline shift, ventriculomegaly or extra-axial fluid collection. No mass lesion. Vascular: Normal flow voids. Skull and upper cervical spine: Normal marrow signal. Sinuses/Orbits: Normal orbits. Clear paranasal sinuses. No mastoid effusion. Other: None. IMPRESSION: Restricted diffusion involving the superior right cerebellum and vermis. Likely an acute infarct however cannot exclude demyelination. Consider postcontrast imaging for further evaluation. These results were called by telephone at the time of interpretation on 03/17/2020 at 7:30 am to provider [facility] , who verbally acknowledged these results. Electronically Signed   By: Stana Buntinghikanele  Emekauwa M.D.   On: 03/17/2020 07:33   MR BRAIN W CONTRAST  Result Date: 03/17/2020 CLINICAL DATA:  Neuro deficit EXAM: MRI HEAD WITH CONTRAST MRA HEAD WITHOUT CONTRAST MRA  NECK WITHOUT AND WITH CONTRAST TECHNIQUE: Multiplanar, multiecho pulse sequences of the brain and surrounding structures were obtained without intravenous contrast. Angiographic images of the head were obtained using MRA technique without contrast. Angiographic images of the neck were obtained using MRA technique with and without intravenous contrast. Carotid stenosis measurements (when applicable) are obtained utilizing NASCET criteria, using the distal internal carotid diameter as the denominator. CONTRAST:  9mL GADAVIST GADOBUTROL 1 MMOL/ML IV SOLN COMPARISON:  Prior same day MRI head. FINDINGS: MRI HEAD FINDINGS Brain: No abnormal enhancement within site of restricted diffusion involving the superior right cerebellum and vermis. No midline shift, ventriculomegaly or extra-axial fluid collection. No mass lesion. Vascular: Normal flow voids. Skull and upper cervical spine: Normal marrow signal. Sinuses/Orbits: Normal orbits. Aerated sinuses and mastoid air cells. Other: None. MRA HEAD FINDINGS Anterior circulation: Normal caliber patent appearance of the internal carotid, middle and anterior cerebral arteries. No significant stenosis, proximal occlusion, aneurysm, or vascular malformation. Posterior circulation: Dominant right vertebral artery. Proximally patent right PICA. Short-segment defects involving the proximal left PICA may reflect high-grade narrowing. Dominant right AICA. Patent normal caliber basilar artery. Patent normal caliber bilateral posterior cerebral arteries. The bilateral superior cerebellar arteries are proximally patent, diminutive on the left. No significant stenosis, proximal occlusion, aneurysm, or vascular malformation. Venous sinuses: Patent Anatomic variants: None of significance. MRA NECK FINDINGS  There is no high-grade narrowing or focal aneurysm involving the bilateral carotid arteries. No evidence of dissection. The bilateral vertebral arteries are patent and demonstrate antegrade  flow. Dominant right vertebral artery. No evidence of high-grade narrowing or focal aneurysm. IMPRESSION: No abnormal enhancement. This is suggestive of acute infarct involving the superior right cerebellum and vermis. High-grade short-segment narrowing involving the proximal left PICA. Otherwise unremarkable MRA head. Normal MRA neck. Electronically Signed   By: Stana Bunting M.D.   On: 03/17/2020 09:13   MR CERVICAL SPINE W WO CONTRAST  Result Date: 03/17/2020 CLINICAL DATA:  Demyelinating disease EXAM: MRI CERVICAL SPINE WITHOUT AND WITH CONTRAST TECHNIQUE: Multiplanar and multiecho pulse sequences of the cervical spine, to include the craniocervical junction and cervicothoracic junction, were obtained without and with intravenous contrast. CONTRAST:  9mL GADAVIST GADOBUTROL 1 MMOL/ML IV SOLN COMPARISON:  03/17/2020 MRI head without and with contrast. FINDINGS: Image quality is mildly degraded by motion artifact. Alignment: Straightening of cervical lordosis. Vertebrae: Normal bone marrow signal intensity. No focal osseous lesion. No abnormal enhancement. Cord: Normal signal and morphology.  No abnormal enhancement. Posterior Fossa, vertebral arteries: Please see concurrent MRI head. Disc levels: Mild C5-6 desiccation.  Disc spaces are preserved. C2-3: No significant disc bulge, spinal canal or neural foraminal narrowing. C3-4: Small disc osteophyte complex with uncovertebral degenerative spurring. Patent spinal canal and neural foramen. C4-5: No significant disc bulge, spinal canal or neural foraminal narrowing. C5-6: No significant disc bulge, spinal canal or neural foraminal narrowing. C6-7: No significant disc bulge, spinal canal or neural foraminal narrowing. C7-T1: No significant disc bulge, spinal canal or neural foraminal narrowing. Paraspinal tissues: Negative. IMPRESSION: No evidence of demyelination or myelopathy. No significant spinal canal or neural foraminal narrowing. Electronically Signed    By: Stana Bunting M.D.   On: 03/17/2020 09:28   DG Chest Portable 1 View  Result Date: 03/15/2020 CLINICAL DATA:  Diaphoresis altered mental status EXAM: PORTABLE CHEST 1 VIEW COMPARISON:  None. FINDINGS: No focal opacity or pleural effusion. Cardiac size upper limits of normal. No pneumothorax. IMPRESSION: No active disease. Electronically Signed   By: Jasmine Pang M.D.   On: 03/15/2020 17:11   VAS [place] TRANSCRANIAL DOPPLER W BUBBLES  Result Date: 03/17/2020  Transcranial Doppler with Bubble Indications: Stroke. Comparison Study: No prior studies. Performing Technologist: Chanda Busing RVT  Examination Guidelines: A complete evaluation includes B-mode imaging, spectral Doppler, color Doppler, and power Doppler as needed of all accessible portions of each vessel. Bilateral testing is considered an integral part of a complete examination. Limited examinations for reoccurring indications may be performed as noted.  Summary:  A vascular evaluation was performed. The right middle cerebral artery was studied. An IV was inserted into the patient's left Cephalic vein. Verbal informed consent was obtained.  No HITS detected during rest or valsalva maneuver. Negative for PFO. *See table(s) above for TCD measurements and observations.    Preliminary    ECHO TEE  Result Date: 03/18/2020    TRANSESOPHOGEAL ECHO REPORT   Patient Name:   CLARKSON ROSSELLI Date of Exam: 03/18/2020 Medical Rec #:  [id number]           Height:       68.0 in Accession #:    [id number]          Weight:       200.0 lb Date of Birth:  [birth year]           BSA:  2.044 m Patient Age:    29 years            BP:           110/80 mmHg Patient Gender: M                   HR:           98 bpm. Exam Location:  Inpatient Procedure: Transesophageal Echo Indications:    stoke  History:        Patient has prior history of Echocardiogram examinations.                 Stroke.  Sonographer:    Tiffany Dance Referring Phys: [id number] Beatriz Stallion PROCEDURE: The transesophogeal probe was passed without difficulty through the esophogus of the patient. Sedation performed by different physician. The patient's vital signs; including heart rate, blood pressure, and oxygen saturation; remained stable throughout the procedure. The patient developed no complications during the procedure. IMPRESSIONS  1. Left ventricular ejection fraction, by estimation, is 60 to 65%. The left ventricle has normal function. The left ventricle has no regional wall motion abnormalities.  2. Right ventricular systolic function is normal. The right ventricular size is normal.  3. No left atrial/left atrial appendage thrombus was detected.  4. The mitral valve is normal in structure. Trivial mitral valve regurgitation. No evidence of mitral stenosis.  5. The aortic valve is normal in structure. Aortic valve regurgitation is not visualized. No aortic stenosis is present.  6. The inferior vena cava is normal in size with greater than 50% respiratory variability, suggesting right atrial pressure of 3 mmHg.  7. Agitated saline contrast bubble study was negative, with no evidence of any interatrial shunt. Conclusion(s)/Recommendation(s): Normal biventricular function without evidence of hemodynamically significant valvular heart disease. FINDINGS  Left Ventricle: Left ventricular ejection fraction, by estimation, is 60 to 65%. The left ventricle has normal function. The left ventricle has no regional wall motion abnormalities. The left ventricular internal cavity size was normal in size. There is  no left ventricular hypertrophy. Right Ventricle: The right ventricular size is normal. No increase in right ventricular wall thickness. Right ventricular systolic function is normal. Left Atrium: Left atrial size was normal in size. No left atrial/left atrial appendage thrombus was detected. Right Atrium: Right atrial size was normal in size. Pericardium: There is no evidence of pericardial  effusion. Mitral Valve: The mitral valve is normal in structure. Normal mobility of the mitral valve leaflets. Trivial mitral valve regurgitation. No evidence of mitral valve stenosis. Tricuspid Valve: The tricuspid valve is normal in structure. Tricuspid valve regurgitation is trivial. No evidence of tricuspid stenosis. Aortic Valve: The aortic valve is normal in structure. Aortic valve regurgitation is not visualized. No aortic stenosis is present. Pulmonic Valve: The pulmonic valve was normal in structure. Pulmonic valve regurgitation is not visualized. No evidence of pulmonic stenosis. Aorta: The aortic root is normal in size and structure. Venous: The inferior vena cava is normal in size with greater than 50% respiratory variability, suggesting right atrial pressure of 3 mmHg. IAS/Shunts: No atrial level shunt detected by color flow Doppler. Agitated saline contrast was given intravenously to evaluate for intracardiac shunting. Agitated saline contrast bubble study was negative, with no evidence of any interatrial shunt.  MITRAL VALVE MV Area (plan): 7.99 cm Charlton Haws MD Electronically signed by Charlton Haws MD Signature Date/Time: 03/18/2020/10:00:15 AM    Final    ECHOCARDIOGRAM COMPLETE BUBBLE STUDY  Result Date:  03/17/2020    ECHOCARDIOGRAM REPORT   Patient Name:   MARLIN JARRARD Date of Exam: 03/17/2020 Medical Rec #:  [id number]           Height:       68.0 in Accession #:    [id number]          Weight:       200.0 lb Date of Birth:  April 26, 1991           BSA:          2.044 m Patient Age:    29 years            BP:           150/102 mmHg Patient Gender: M                   HR:           90 bpm. Exam Location:  Inpatient Procedure: 2D Echo, Cardiac Doppler, Color Doppler and Saline Contrast Bubble            Study Indications:    Stroke  History:        Patient has no prior history of Echocardiogram examinations.                 Signs/Symptoms:Dizziness/Lightheadedness.  Sonographer:    Sheralyn Boatman  RDCS Referring Phys: [id number] [facility] IMPRESSIONS  1. Left ventricular ejection fraction, by estimation, is 60 to 65%. The left ventricle has normal function. The left ventricle has no regional wall motion abnormalities. There is moderate left ventricular hypertrophy. Left ventricular diastolic parameters were normal.  2. Right ventricular systolic function is normal. The right ventricular size is normal.  3. Left atrial size was mildly dilated.  4. The mitral valve is normal in structure. Trivial mitral valve regurgitation. No evidence of mitral stenosis.  5. The aortic valve is normal in structure. Aortic valve regurgitation is not visualized. No aortic stenosis is present.  6. The inferior vena cava is normal in size with greater than 50% respiratory variability, suggesting right atrial pressure of 3 mmHg. FINDINGS  Left Ventricle: Left ventricular ejection fraction, by estimation, is 60 to 65%. The left ventricle has normal function. The left ventricle has no regional wall motion abnormalities. The left ventricular internal cavity size was normal in size. There is  moderate left ventricular hypertrophy. Left ventricular diastolic parameters were normal. Right Ventricle: The right ventricular size is normal. No increase in right ventricular wall thickness. Right ventricular systolic function is normal. Left Atrium: Left atrial size was mildly dilated. Right Atrium: Right atrial size was normal in size. Pericardium: There is no evidence of pericardial effusion. Mitral Valve: The mitral valve is normal in structure. Normal mobility of the mitral valve leaflets. Trivial mitral valve regurgitation. No evidence of mitral valve stenosis. Tricuspid Valve: The tricuspid valve is normal in structure. Tricuspid valve regurgitation is not demonstrated. No evidence of tricuspid stenosis. Aortic Valve: The aortic valve is normal in structure. Aortic valve regurgitation is not visualized. No aortic stenosis is  present. Pulmonic Valve: The pulmonic valve was normal in structure. Pulmonic valve regurgitation is not visualized. No evidence of pulmonic stenosis. Aorta: The aortic root is normal in size and structure. Venous: The inferior vena cava is normal in size with greater than 50% respiratory variability, suggesting right atrial pressure of 3 mmHg. IAS/Shunts: No atrial level shunt detected by color flow Doppler. Agitated saline contrast was given intravenously to evaluate for intracardiac shunting.  LEFT VENTRICLE PLAX 2D LVIDd:         4.50 cm     Diastology LVIDs:         2.80 cm     LV e' lateral:   16.40 cm/s LV PW:         1.40 cm     LV E/e' lateral: 2.3 LV IVS:        1.60 cm     LV e' medial:    7.29 cm/s LVOT diam:     2.10 cm     LV E/e' medial:  5.2 LV SV:         62 LV SV Index:   30 LVOT Area:     3.46 cm  LV Volumes (MOD) LV vol d, MOD A2C: 86.9 ml LV vol d, MOD A4C: 88.2 ml LV vol s, MOD A2C: 29.4 ml LV vol s, MOD A4C: 30.5 ml LV SV MOD A2C:     57.5 ml LV SV MOD A4C:     88.2 ml LV SV MOD BP:      63.6 ml RIGHT VENTRICLE             IVC RV S prime:     16.40 cm/s  IVC diam: 2.40 cm TAPSE (M-mode): 3.0 cm LEFT ATRIUM             Index       RIGHT ATRIUM           Index LA diam:        3.80 cm 1.86 cm/m  RA Area:     15.40 cm LA Vol (A2C):   34.8 ml 17.03 ml/m RA Volume:   37.10 ml  18.15 ml/m LA Vol (A4C):   35.9 ml 17.57 ml/m LA Biplane Vol: 35.6 ml 17.42 ml/m  AORTIC VALVE LVOT Vmax:   91.20 cm/s LVOT Vmean:  60.900 cm/s LVOT VTI:    0.179 m  AORTA Ao Root diam: 3.80 cm Ao Asc diam:  3.40 cm MITRAL VALVE MV Area (PHT): 2.95 cm    SHUNTS MV Decel Time: 257 msec    Systemic VTI:  0.18 m MV E velocity: 37.60 cm/s  Systemic Diam: 2.10 cm MV A velocity: 57.60 cm/s MV E/A ratio:  0.65 Charlton Haws MD Electronically signed by Charlton Haws MD Signature Date/Time: 03/17/2020/10:24:39 AM    Final     Microbiology: Recent Results (from the past 240 hour(s))  SARS Coronavirus 2 by RT PCR (hospital  order, performed in [facility] hospital lab) Nasopharyngeal Nasopharyngeal Swab     Status: None   Collection Time: 03/15/20  5:20 PM   Specimen: Nasopharyngeal Swab  Result Value Ref Range Status   SARS Coronavirus 2 NEGATIVE NEGATIVE Final    Comment: (NOTE) SARS-CoV-2 target nucleic acids are NOT DETECTED.  The SARS-CoV-2 RNA is generally detectable in upper and lower respiratory specimens during the acute phase of infection. The lowest concentration of SARS-CoV-2 viral copies this assay can detect is 250 copies / mL. A negative result does not preclude SARS-CoV-2 infection and should not be used as the sole basis for treatment or other patient management decisions.  A negative result may occur with improper specimen collection / handling, submission of specimen other than nasopharyngeal swab, presence of viral mutation(s) within the areas targeted by this assay, and inadequate number of viral copies (<250 copies / mL). A negative result must be combined with clinical observations, patient history, and epidemiological information.  Fact Sheet  for Patients:   BoilerBrush.com.cy  Fact Sheet for Healthcare Providers: https://pope.com/  This test is not yet approved or  cleared by the [place] FDA and has been authorized for detection and/or diagnosis of SARS-CoV-2 by FDA under an Emergency Use Authorization (EUA).  This EUA will remain in effect (meaning this test can be used) for the duration of the COVID-19 declaration under Section 564(b)(1) of the Act, 21 U.S.C. section 360bbb-3(b)(1), unless the authorization is terminated or revoked sooner.  Performed at [facility], [address] [place], [place] [address]   Urine culture     Status: None   Collection Time: 03/16/20  1:36 AM   Specimen: Urine, Random  Result Value Ref Range Status   Specimen Description URINE, RANDOM  Final   Special Requests NONE  Final    Culture   Final    NO GROWTH Performed at [facility], [address], [place], [place] [address]    Report Status 03/16/2020 FINAL  Final     Labs: Basic Metabolic Panel: Recent Labs  Lab 03/15/20 1700 03/16/20 0509 03/17/20 0559 03/18/20 1752 03/19/20 0259  NA 141 140 138 139 137  K 4.9 3.8 3.4* 3.3* 3.2*  CL 106 106 104 103 104  CO2 21* GLUCOSE 112* 110* 93 87 97  BUN 9 5* 6 5* <5*  CREATININE 0.98 0.83 0.70 0.90 0.78  CALCIUM 9.4 9.1 8.8* 9.5 8.9  MG 2.4  --   --   --   --    Liver Function Tests: Recent Labs  Lab 03/15/20 1700  AST 70*  ALT 63*  ALKPHOS 74  BILITOT 1.5*  PROT 7.8  ALBUMIN 4.7   Recent Labs  Lab 03/15/20 1700  LIPASE 28   No results for input(s): AMMONIA in the last 168 hours. CBC: Recent Labs  Lab 03/15/20 1700 03/16/20 0509 03/17/20 0559 03/18/20 1752 03/19/20 0259  WBC 13.5* 9.2 8.3 12.0* 9.3  NEUTROABS 10.0*  --  5.5 9.3* 6.4  HGB 16.3 14.3 13.8 15.1 13.9  HCT 49.6 44.7 42.5 45.8 41.7  MCV 87.3 88.7 87.3 85.9 85.1  PLT 270 246 232 266 247   Cardiac Enzymes: Recent Labs  Lab 03/15/20 1700  CKTOTAL 152   BNP: BNP (last 3 results) No results for input(s): BNP in the last 8760 hours.  ProBNP (last 3 results) No results for input(s): PROBNP in the last 8760 hours.  CBG: Recent Labs  Lab 03/15/20 1628  GLUCAP 113*       Signed:  Briant Cedar, MD [facility] 03/19/2020, 11:25 AM

## 2020-03-20 ENCOUNTER — Encounter (HOSPITAL_COMMUNITY): Payer: Self-pay | Admitting: Cardiovascular Disease

## 2020-03-21 NOTE — Progress Notes (Signed)
Kindly inform patient that test for abnormal clotting is unremarkable

## 2020-03-22 ENCOUNTER — Telehealth: Payer: Self-pay | Admitting: *Deleted

## 2020-03-22 ENCOUNTER — Encounter: Payer: Self-pay | Admitting: *Deleted

## 2020-03-22 ENCOUNTER — Other Ambulatory Visit: Payer: Self-pay | Admitting: *Deleted

## 2020-03-22 LAB — FACTOR 5 LEIDEN

## 2020-03-22 LAB — PROTHROMBIN GENE MUTATION

## 2020-03-22 NOTE — Telephone Encounter (Signed)
LVM requesting call back re: lab result.

## 2020-03-22 NOTE — Patient Outreach (Signed)
[facility]) Care Management  [facility] Care Manager  03/22/2020   Jackson Terrell 1991-01-22 [id number]  Subjective: Received in-basket message via  Link / Epic, from Jackson Terrell at [facility], stating patient called in to [facility] Care Management main line on 03/21/2020 (office closed).   Telephone call to patient's home / mobile number, spoke with patient, and HIPAA verified.  Discussed [facility] Care Management Self Pay EMMI Stroke Red Flag Alert  follow up referral , patient voiced understanding, and is in agreement to follow up.    Patient gave verbal consent for this RNCM to speak with mother Jackson Terrell) regarding patient's healthcare needs as needed.  Patient placed caller on speaker phone, remainder of assessment, completed with patient and mother.  Patient mother states remembers patient receiving EMMI automated call.   States patient just pushed option to speak with someone regarding the call and does not have any specific questions at this time.   Patient states he is done fine.   Mother states patient, working on speech, tired at times, working on increasing endurance, and walks without difficulty.  Mother states patient has the following appointments: on 03/23/2020 with primary MD, on 04/20/2020 with neurologist.   States she has not received a call regarding the neuro rehab appointment and is planning to call this afternoon to follow up.  States no other follow up appointments listed on discharge instructions.  Patient states he is able to manage self care and has assistance as needed. Patient voices understanding of medical diagnosis and treatment plan.  Mother advised of signs/ symptoms to report, how to reach provider if needed after hours, when to go to ED, and / or call 911.   Discussed Advanced Directives, advised of [facility] Care Management  Advanced Directives packet resource, patient voices understanding, and declined to be sent packet at this time.  Mother  requested packet to be sent.   RNCM advised of [employer] document scanning  system for Advanced Directives documents via local providers office or hospitals, patient  voices understanding, and she will discuss accessing through patient's primary provider if needed in the future.   Patient states he does not have any education material, EMMI follow up, care coordination, care management, disease monitoring, transportation, or pharmacy needs at this time.  States he is very appreciative of the follow up, is in agreement  to receive 1 additional follow up call to assess for further CM needs, and is in agreement to receive [facility] Care Management EMMI follow up calls as needed.         Objective: Per KPN (Knowledge Performance Now, point of care tool) and chart review, patient hospitalized 03/15/2020 - 03/19/2020 for Serotonin syndrome,  Cerebral embolism with cerebral infarction.    Patient also has a history of depression.     Encounter Medications:  Outpatient Encounter Medications as of 03/22/2020  Medication Sig  . aspirin EC 81 MG EC tablet Take 1 tablet (81 mg total) by mouth daily. Swallow whole.  Marland Kitchen atorvastatin (LIPITOR) 80 MG tablet Take 1 tablet (80 mg total) by mouth daily.  . clopidogrel (PLAVIX) 75 MG tablet Take 1 tablet (75 mg total) by mouth daily for 19 days.  Marland Kitchen sertraline (ZOLOFT) 100 MG tablet Take 1 tablet (100 mg total) by mouth daily.   No facility-administered encounter medications on file as of 03/22/2020.    Functional Status:  In your present state of health, do you have any difficulty performing the following activities: 03/16/2020  Hearing? N  Vision? N  Difficulty concentrating or making decisions? N  Walking or climbing stairs? N  Dressing or bathing? N  Doing errands, shopping? N  Some recent data might be hidden    Fall/Depression Screening: No flowsheet data found. PHQ 2/9 Scores 03/22/2020  PHQ - 2 Score 0    Assessment: Received Self Pay EMMI Stroke Red Flag Alert  follow up referral on 03/22/2020.  Red Flag Alert Trigger, Day #1, patient answered no to the following question: Scheduled a follow-up appointment?   EMMI follow up completed and will follow up to assess further care management needs.      Plan: RNCM will send patient successful outreach letter, [facility] pamphlet,  [employer], and Advanced Directives packet. RNCM will call patient for telephone outreach attempt, within 21 business days, EMMI follow up, to assess for further CM needs, and proceed with case closure, after 4th unsuccessful outreach call.       Jackson Terrell, BSN, CCM [facility] Care Management [facility] Telephonic CM Phone: [phone] Fax: [phone]

## 2020-03-22 NOTE — Telephone Encounter (Signed)
Patient returned call,  Informed him that test for abnormal clotting is unremarkable. Reminded him of FU in Oct, advised he call sooner for any problems, questions.   Patient verbalized understanding, appreciation.

## 2020-03-25 ENCOUNTER — Ambulatory Visit: Payer: BC Managed Care – PPO | Attending: Internal Medicine | Admitting: Physical Therapy

## 2020-03-25 ENCOUNTER — Telehealth: Payer: Self-pay | Admitting: Physical Therapy

## 2020-03-25 ENCOUNTER — Other Ambulatory Visit: Payer: Self-pay

## 2020-03-25 VITALS — BP 138/95 | HR 88

## 2020-03-25 DIAGNOSIS — R471 Dysarthria and anarthria: Secondary | ICD-10-CM | POA: Diagnosis present

## 2020-03-25 DIAGNOSIS — M6281 Muscle weakness (generalized): Secondary | ICD-10-CM | POA: Diagnosis present

## 2020-03-25 DIAGNOSIS — R42 Dizziness and giddiness: Secondary | ICD-10-CM | POA: Insufficient documentation

## 2020-03-25 DIAGNOSIS — R2689 Other abnormalities of gait and mobility: Secondary | ICD-10-CM | POA: Diagnosis present

## 2020-03-25 DIAGNOSIS — R29818 Other symptoms and signs involving the nervous system: Secondary | ICD-10-CM | POA: Insufficient documentation

## 2020-03-25 DIAGNOSIS — I69322 Dysarthria following cerebral infarction: Secondary | ICD-10-CM

## 2020-03-25 DIAGNOSIS — R278 Other lack of coordination: Secondary | ICD-10-CM | POA: Insufficient documentation

## 2020-03-25 DIAGNOSIS — R2681 Unsteadiness on feet: Secondary | ICD-10-CM | POA: Diagnosis present

## 2020-03-25 NOTE — Therapy (Signed)
[facility] [address] [place], [place], [address] Phone: [phone]   Fax:  [phone]  Physical Therapy Evaluation  Patient Details  Name: Jackson Terrell MRN: [id number] Date of Birth: October 06, 1990 Referring Provider (PT): Dr. Sharolyn Douglas (hospitalist) Ihor Austin, NP; Aura Dials, MD (PCP))   Encounter Date: 03/25/2020   PT End of Session - 03/25/20 1550    Visit Number 1    Number of Visits 13    Authorization Type BCBS    PT Start Time 1104    PT Stop Time 1151    PT Time Calculation (min) 47 min    Activity Tolerance Patient tolerated treatment well    Behavior During Therapy [facility] for tasks assessed/performed           Past Medical History:  Diagnosis Date  . Scoliosis    mild    Past Surgical History:  Procedure Laterality Date  . BUBBLE STUDY  03/18/2020   Procedure: BUBBLE STUDY;  Surgeon: Wendall Stade, MD;  Location: [facility] ENDOSCOPY;  Service: Cardiovascular;;  . TEE WITHOUT CARDIOVERSION N/A 03/18/2020   Procedure: TRANSESOPHAGEAL ECHOCARDIOGRAM (TEE);  Surgeon: Wendall Stade, MD;  Location: [facility] ENDOSCOPY;  Service: Cardiovascular;  Laterality: N/A;    Vitals:   03/25/20 1125 03/25/20 1131  BP: (!) 143/101 (!) 138/95  Pulse: 81 88      Subjective Assessment - 03/25/20 1108    Subjective Pt went to ED 03/15/2020 with CVA on R side weakness.  Try not to use the walker unless I need to.  Feel like the R leg is getting better and the R arm slower. My speech is garbled some. No falls.  Notice that when I walk, I feel dizzy.    Patient is accompained by: Family member   Mom   Patient Stated Goals Pt's goals for therapy are to improve walking straight and get R arm and hand work better.    Currently in Pain? No/denies              [facility] PT Assessment - 03/25/20 1114      Assessment   Medical Diagnosis Serotonin syndrome, s/p CVA-embolism of pre-cerebral artery    Referring Provider  (PT) Dr. Sharolyn Douglas (hospitalist)   Ihor Austin, NP; Aura Dials, MD (PCP)   Onset Date/Surgical Date 03/15/20    Hand Dominance Right    Next MD Visit 10/6/20021   Neurologist     Precautions   Precautions Fall    Precaution Comments Not currently driving      Balance Screen   Has the patient fallen in the past 6 months No    Has the patient had a decrease in activity level because of a fear of falling?  Yes    Is the patient reluctant to leave their home because of a fear of falling?  No      Home Nurse, mental health Private residence    Living Arrangements Alone    Available Help at Discharge Family   Mom has come from [facility] to help   Type of Home Apartment    Home Access Elevator    Home Layout One level    Home Equipment Walker - 2 wheels;Shower seat      Prior Function   Level of Independence Independent    Vocation Full time employment   Accounting   Leisure Occasional weightlifting in apartment complex      Observation/Other Assessments   Focus  on Therapeutic Outcomes (FOTO)  Functional Intake Score:  47.37%      Sensation   Light Touch Appears Intact      ROM / Strength   AROM / PROM / Strength AROM;Strength      AROM   Overall AROM  Within functional limits for tasks performed      Strength   Overall Strength Comments Grossly tested [facility]; however, RLE functional strength difficult with SLS and tandem stance.      Transfers   Transfers Sit to Stand;Stand to Sit    Sit to Stand 6: Modified independent (Device/Increase time);Without upper extremity assist;From chair/3-in-1    Five time sit to stand comments  10.65    Stand to Sit 6: Modified independent (Device/Increase time)      Ambulation/Gait   Ambulation/Gait Yes    Ambulation/Gait Assistance 5: Supervision;4: Min guard    Ambulation/Gait Assistance Details Pt carries RW into therapy session; reports he has it just in case he needs it.  (PT adjusted height higher and instructed it is likely  safer to lightly hold to push walker than to carry it).    Ambulation Distance (Feet) 200 Feet    Assistive device Rolling walker;None    Gait Pattern Step-through pattern;Decreased arm swing - left;Decreased stance time - right;Decreased step length - left;Ataxic;Wide base of support    Ambulation Surface Level;Indoor    Gait velocity 15.07 sec with RW (2.18 ft/sec); 12.41 sec with no device (2.64 ft/sec)    Stairs Yes    Stairs Assistance 6: Modified independent (Device/Increase time)    Stair Management Technique One rail Left;Alternating pattern;Forwards    Number of Stairs 4    Height of Stairs 6      Standardized Balance Assessment   Standardized Balance Assessment Dynamic Gait Index;Timed Up and Go Test      Dynamic Gait Index   Level Surface Moderate Impairment    Change in Gait Speed Mild Impairment    Gait with Horizontal Head Turns Moderate Impairment    Gait with Vertical Head Turns Mild Impairment    Gait and Pivot Turn Mild Impairment    Step Over Obstacle Moderate Impairment    Step Around Obstacles Mild Impairment    Steps Mild Impairment    Total Score 13    DGI comment: Scores <19/24 indicate increased fall risk.      Timed Up and Go Test   TUG Normal TUG    Normal TUG (seconds) 13.94    TUG Comments Scores >13.5 sec indicate increased fall risk.      High Level Balance   High Level Balance Comments SLS 1.97 sec LLE, 1.75 sec RLE; able to stand 30 seconds both foot positions tandem, with increased sway, increased ankle excursion RLE when RLE in posterior position.                  Vestibular Assessment - 03/25/20 0001      Symptom Behavior   Subjective history of current problem Dizziness with walking, 1-2/10; gets better with more walking (uses walker as needed)    Type of Dizziness  Imbalance    Frequency of Dizziness walking    Aggravating Factors --   Walking   Relieving Factors --   Sitting   Progression of Symptoms Better               Objective measurements completed on examination: See above findings.  PT Education - 03/25/20 1548    Education Details PT POC and results of eval    Person(s) Educated Patient;Parent(s)   Mother present at beginning of session   Methods Explanation    Comprehension Verbalized understanding            PT Short Term Goals - 03/25/20 1603      PT SHORT TERM GOAL #1   Title Pt will be independent with HEP for improved strength, balance, transfers, and gait.  TARGET 04/22/2020    Time 4    Period Weeks    Status New      PT SHORT TERM GOAL #2   Title Pt will improve TUG score to less than or equal to 13.5 sec with no device, for decreased fall risk.    Time 4    Period Weeks    Status New      PT SHORT TERM GOAL #3   Title Pt will improve SLS to at least 3 seconds each lower extremity for improved functional strength and balance.    Time 4    Period Weeks    Status New      PT SHORT TERM GOAL #4   Title Pt will improve DGI score to at least 16/24 for decreased fall risk.    Time 4    Period Weeks    Status New      PT SHORT TERM GOAL #5   Title Pt will verbalize understanding of fall prevention education for home environment.    Time 4    Period Weeks    Status New             PT Long Term Goals - 03/25/20 1605      PT LONG TERM GOAL #1   Title Pt will be independent with HEP for improved strength, balance, transfers, and gait.  TARGET 05/06/2020    Time 6    Period Weeks    Status New      PT LONG TERM GOAL #2   Title Pt will improve gait velocity to at least 3 ft/sec for improved gait efficiency and safety in community.    Time 6    Period Weeks    Status New      PT LONG TERM GOAL #3   Title Pt will improve DGI score to at least 19/24 for decreased fall risk.    Time 6    Period Weeks    Status New      PT LONG TERM GOAL #4   Title Pt will ambulate at least 1000 ft, indoor and outdoor surfaces, no device,  independently, for safe return to independent community giat.    Time 6    Period Weeks    Status New      PT LONG TERM GOAL #5   Title Pt will report improvement in FOTO score at d/c by at least 20% for improved overall functional mobility.    Baseline 47.37%    Time 6    Period Weeks    Status New                  Plan - 03/25/20 1551    Clinical Impression Statement The pt is a 29 yo male presenting to ED with N&V,noted to have diffuse hyperreflexia. 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  Pt was d/c home 03/19/2020.  He presents to OPPT with decreased balance, decreased independence with gait,  decreased functional strength in RLE; he reports dizziness, but upon discussion, he reports more of unbalanced sensation, which is overall decreaseing.  He is at fall risk per TUG and Berg scores, he demo gait velocity near limited community ambulator threshold.  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  He will benefit from skilled PT to address the above stated deficits for improved overall mobility, decreased fall risk and return to independence.    Personal Factors and Comorbidities Other;Comorbidity 1   HTN (BP elevated during PT session; almost to parameter unable to treat)   Comorbidities Hx of depression    Examination-Activity Limitations Locomotion Level;Stairs;Stand    Examination-Participation Restrictions Occupation;Community Activity    Stability/Clinical Decision Making Stable/Uncomplicated    Clinical Decision Making Low    Rehab Potential Good    PT Frequency 2x / week    PT Duration 6 weeks   plus eval   PT Treatment/Interventions ADLs/Self Care Home Management;DME Instruction;Gait training;Stair training;Functional mobility training;Therapeutic activities;Therapeutic exercise;Balance training;Neuromuscular re-education;Patient/family education;Vestibular    PT Next Visit Plan Need to check BP; Initiate  HEP for balance (SLS, tandem stance); need to look at EO/EC and try corner balance exercises, functional strengthening.  May need to do further vestibular testing based on his response.    Recommended Other Services Speech therapy consult-due to noted speech changes since CVA.  PT will request order from MD.    Consulted and Agree with Plan of Care Patient;Family member/caregiver    Family Member Consulted mother           Patient will benefit from skilled therapeutic intervention in order to improve the following deficits and impairments:  Abnormal gait, Difficulty walking, Dizziness, Decreased balance, Decreased mobility, Decreased strength  Visit Diagnosis: Unsteadiness on feet  Other abnormalities of gait and mobility  Dizziness and giddiness  Muscle weakness (generalized)     Problem List Patient Active Problem List   Diagnosis Date Noted  . 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 Depression 03/15/2020    Shuna Tabor W. 03/25/2020, 4:09 PM Gean Maidens., PT  [facility] [address] [place], [place], [address] Phone: [phone]   Fax:  [phone]  Name: Jackson Terrell MRN: [id number] Date of Birth: 1991/02/18

## 2020-03-25 NOTE — Telephone Encounter (Signed)
Hello, This patient was seen by Dr. Pearlean Brownie in the hospital and will be seen by you in clinic in October for follow-up.  At our PT eval today, he notes some changes in speech compared to before CVA.  Could you please write an order for speech therapy eval and treat?  The patient is in agreement.  Thank you.  Lonia Blood, PT

## 2020-03-28 ENCOUNTER — Ambulatory Visit: Payer: BC Managed Care – PPO

## 2020-03-28 ENCOUNTER — Other Ambulatory Visit: Payer: Self-pay

## 2020-03-28 VITALS — BP 128/90

## 2020-03-28 DIAGNOSIS — R2681 Unsteadiness on feet: Secondary | ICD-10-CM

## 2020-03-28 DIAGNOSIS — M6281 Muscle weakness (generalized): Secondary | ICD-10-CM

## 2020-03-28 DIAGNOSIS — R2689 Other abnormalities of gait and mobility: Secondary | ICD-10-CM

## 2020-03-28 NOTE — Therapy (Signed)
[facility] [address] [place], [place], [address] Phone: [phone]   Fax:  [phone]  Physical Therapy Treatment  Patient Details  Name: Jackson Terrell MRN: [id number] Date of Birth: March 27, 1991 Referring Provider (PT): Dr. Sharolyn Douglas (hospitalist) Ihor Austin, NP; Aura Dials, MD (PCP))   Encounter Date: 03/28/2020   PT End of Session - 03/28/20 1107    Visit Number 2    Number of Visits 13    Authorization Type BCBS    PT Start Time 1105    PT Stop Time 1144    PT Time Calculation (min) 39 min    Equipment Utilized During Treatment Gait belt    Activity Tolerance Patient tolerated treatment well    Behavior During Therapy WFL for tasks assessed/performed           Past Medical History:  Diagnosis Date  . Scoliosis    mild    Past Surgical History:  Procedure Laterality Date  . BUBBLE STUDY  03/18/2020   Procedure: BUBBLE STUDY;  Surgeon: Wendall Stade, MD;  Location: [facility] ENDOSCOPY;  Service: Cardiovascular;;  . TEE WITHOUT CARDIOVERSION N/A 03/18/2020   Procedure: TRANSESOPHAGEAL ECHOCARDIOGRAM (TEE);  Surgeon: Wendall Stade, MD;  Location: [facility] ENDOSCOPY;  Service: Cardiovascular;  Laterality: N/A;    Vitals:   03/28/20 1109 03/28/20 1113  BP: (!) 152/92 128/90     Subjective Assessment - 03/28/20 1107    Subjective Pt reports he is still getting some "dizziness" when gets up. Has trouble walking in straight line. Feels walker is difficult to use at home.    Patient is accompained by: Family member   Mom   Patient Stated Goals Pt's goals for therapy are to improve walking straight and get R arm and hand work better.    Currently in Pain? No/denies              [facility] PT Assessment - 03/28/20 1113      Functional Gait  Assessment   Gait assessed  Yes    Gait Level Surface Walks 20 ft, slow speed, abnormal gait pattern, evidence for imbalance or deviates 10-15 in outside of the 12  in walkway width. Requires more than 7 sec to ambulate 20 ft.    Change in Gait Speed Able to change speed, demonstrates mild gait deviations, deviates 6-10 in outside of the 12 in walkway width, or no gait deviations, unable to achieve a major change in velocity, or uses a change in velocity, or uses an assistive device.    Gait with Horizontal Head Turns Performs head turns smoothly with slight change in gait velocity (eg, minor disruption to smooth gait path), deviates 6-10 in outside 12 in walkway width, or uses an assistive device.    Gait with Vertical Head Turns Performs task with slight change in gait velocity (eg, minor disruption to smooth gait path), deviates 6 - 10 in outside 12 in walkway width or uses assistive device    Gait and Pivot Turn Pivot turns safely within 3 sec and stops quickly with no loss of balance.    Step Over Obstacle Is able to step over one shoe box (4.5 in total height) without changing gait speed. No evidence of imbalance.    Gait with Narrow Base of Support Ambulates less than 4 steps heel to toe or cannot perform without assistance.    Gait with Eyes Closed Walks 20 ft, slow speed, abnormal gait pattern, evidence for imbalance, deviates  10-15 in outside 12 in walkway width. Requires more than 9 sec to ambulate 20 ft.    Ambulating Backwards Walks 20 ft, uses assistive device, slower speed, mild gait deviations, deviates 6-10 in outside 12 in walkway width.    Steps Alternating feet, must use rail.    Total Score 17                         [facility] Adult PT Treatment/Exercise - 03/28/20 1113      Transfers   Transfers Sit to Stand;Stand to Sit    Sit to Stand 6: Modified independent (Device/Increase time)    Stand to Sit 6: Modified independent (Device/Increase time)      Ambulation/Gait   Ambulation/Gait Yes    Ambulation/Gait Assistance 5: Supervision;4: Min guard    Ambulation/Gait Assistance Details Pt ambulated in to clinic with RW. Around  in clinic during session utilized [facility]. Verbal cues for sequencing.    Ambulation Distance (Feet) 345 Feet    Assistive device Straight cane    Gait Pattern Step-through pattern;Decreased arm swing - right;Ataxic    Ambulation Surface Level;Indoor      Neuro Re-ed    Neuro Re-ed Details  Along counter: side stepping 6' x 6 without UE support, marching gait 6' x 6 with occasional UE support, tandem gait with 1 UE support 6' x 6, reciprocal steps over 4 cones with tapping cone first with 1 UE support. Close SBA with activities. Pt was cued to focus on foot placement and take his time. In hallway with SPC worked on head turns left/right 40' x 4 CGA. Pt staggered at times but improved some with SPC.                  PT Education - 03/28/20 1218    Education Details Started on initial balance HEP at counter. Discussed changing positions slowly as BP did drop in standing.    Person(s) Educated Patient    Methods Explanation;Demonstration;Handout    Comprehension Verbalized understanding            PT Short Term Goals - 03/25/20 1603      PT SHORT TERM GOAL #1   Title Pt will be independent with HEP for improved strength, balance, transfers, and gait.  TARGET 04/22/2020    Time 4    Period Weeks    Status New      PT SHORT TERM GOAL #2   Title Pt will improve TUG score to less than or equal to 13.5 sec with no device, for decreased fall risk.    Time 4    Period Weeks    Status New      PT SHORT TERM GOAL #3   Title Pt will improve SLS to at least 3 seconds each lower extremity for improved functional strength and balance.    Time 4    Period Weeks    Status New      PT SHORT TERM GOAL #4   Title Pt will improve DGI score to at least 16/24 for decreased fall risk.    Time 4    Period Weeks    Status New      PT SHORT TERM GOAL #5   Title Pt will verbalize understanding of fall prevention education for home environment.    Time 4    Period Weeks    Status New  PT Long Term Goals - 03/25/20 1605      PT LONG TERM GOAL #1   Title Pt will be independent with HEP for improved strength, balance, transfers, and gait.  TARGET 05/06/2020    Time 6    Period Weeks    Status New      PT LONG TERM GOAL #2   Title Pt will improve gait velocity to at least 3 ft/sec for improved gait efficiency and safety in community.    Time 6    Period Weeks    Status New      PT LONG TERM GOAL #3   Title Pt will improve DGI score to at least 19/24 for decreased fall risk.    Time 6    Period Weeks    Status New      PT LONG TERM GOAL #4   Title Pt will ambulate at least 1000 ft, indoor and outdoor surfaces, no device, independently, for safe return to independent community giat.    Time 6    Period Weeks    Status New      PT LONG TERM GOAL #5   Title Pt will report improvement in FOTO score at d/c by at least 20% for improved overall functional mobility.    Baseline 47.37%    Time 6    Period Weeks    Status New                 Plan - 03/28/20 1220    Clinical Impression Statement PT initiated standing balance HEP at counter. Pt able to improve control significantly with just light UE support. FGA score of 17/30 again supports high fall risk. PT trialed SPC today. Will need continued work before clears for home. BP was orthostatic today with minimal symptoms. Educated to change positions slowly.    Personal Factors and Comorbidities Other;Comorbidity 1   HTN (BP elevated during PT session; almost to parameter unable to treat)   Comorbidities Hx of depression    Examination-Activity Limitations Locomotion Level;Stairs;Stand    Examination-Participation Restrictions Occupation;Community Activity    Stability/Clinical Decision Making Stable/Uncomplicated    Rehab Potential Good    PT Frequency 2x / week    PT Duration 6 weeks   plus eval   PT Treatment/Interventions ADLs/Self Care Home Management;DME Instruction;Gait training;Stair  training;Functional mobility training;Therapeutic activities;Therapeutic exercise;Balance training;Neuromuscular re-education;Patient/family education;Vestibular    PT Next Visit Plan Continue gait training with SPC. Add to HEP for balance (SLS, tandem stance); need to look at EO/EC and try corner balance exercises, functional strengthening.  May need to do further vestibular testing based on his response.    Consulted and Agree with Plan of Care Patient;Family member/caregiver    Family Member Consulted mother           Patient will benefit from skilled therapeutic intervention in order to improve the following deficits and impairments:  Abnormal gait, Difficulty walking, Dizziness, Decreased balance, Decreased mobility, Decreased strength  Visit Diagnosis: Other abnormalities of gait and mobility  Muscle weakness (generalized)  Unsteadiness on feet     Problem List Patient Active Problem List   Diagnosis Date Noted  . 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 Depression 03/15/2020    Ronn Melena, PT, DPT, NCS 03/28/2020, 12:24 PM  [facility] [address] [place], [place], [address] Phone: [phone]   Fax:  [phone]  Name: Jackson Terrell MRN: [id number]  Date of Birth: July 23, 1990

## 2020-03-28 NOTE — Patient Instructions (Signed)
Access Code: [email or web address] URL: [email or web address] Date: 03/28/2020 Prepared by: Elmer Bales  Exercises Walking March - 2 x daily - 7 x weekly - 1 sets - 3 reps Side Stepping with Counter Support - 2 x daily - 7 x weekly - 1 sets - 3 reps Walking Tandem Stance - 2 x daily - 7 x weekly - 1 sets - 3 reps

## 2020-03-28 NOTE — Telephone Encounter (Signed)
Referral placed to outpatient neuro rehab for post stroke slurred speech per request of Lonia Blood, PT

## 2020-03-31 ENCOUNTER — Encounter: Payer: Self-pay | Admitting: Occupational Therapy

## 2020-03-31 ENCOUNTER — Other Ambulatory Visit: Payer: Self-pay

## 2020-03-31 ENCOUNTER — Ambulatory Visit: Payer: BC Managed Care – PPO | Admitting: Occupational Therapy

## 2020-03-31 DIAGNOSIS — M6281 Muscle weakness (generalized): Secondary | ICD-10-CM

## 2020-03-31 DIAGNOSIS — R278 Other lack of coordination: Secondary | ICD-10-CM

## 2020-03-31 DIAGNOSIS — R2681 Unsteadiness on feet: Secondary | ICD-10-CM | POA: Diagnosis not present

## 2020-03-31 DIAGNOSIS — R29818 Other symptoms and signs involving the nervous system: Secondary | ICD-10-CM

## 2020-04-01 NOTE — Therapy (Signed)
[facility] [address] [place], [place], [address] Phone: [phone]   Fax:  [phone]  Occupational Therapy Evaluation  Patient Details  Name: OWAIS PRUETT MRN: [id number] Date of Birth: 2000/05/18 Referring Provider (OT): Ihor Austin, NP ((pt was referred by hospitalist))   Encounter Date: 03/31/2020   OT End of Session - 03/31/20 1300    Visit Number 1    Number of Visits 17    Date for OT Re-Evaluation 05/26/20    Authorization Type BCBS    OT Start Time 1103    OT Stop Time 1145    OT Time Calculation (min) 42 min           Past Medical History:  Diagnosis Date  . Scoliosis    mild    Past Surgical History:  Procedure Laterality Date  . BUBBLE STUDY  03/18/2020   Procedure: BUBBLE STUDY;  Surgeon: Wendall Stade, MD;  Location: [facility] ENDOSCOPY;  Service: Cardiovascular;;  . TEE WITHOUT CARDIOVERSION N/A 03/18/2020   Procedure: TRANSESOPHAGEAL ECHOCARDIOGRAM (TEE);  Surgeon: Wendall Stade, MD;  Location: [facility] ENDOSCOPY;  Service: Cardiovascular;  Laterality: N/A;    There were no vitals filed for this visit.   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 No/denies             [facility] OT Assessment - 04/01/20 0001      Assessment   Medical Diagnosis Serotonin syndrome, s/p CVA-embolism of pre-cerebral artery    Referring Provider (OT) Ihor Austin, NP   (pt was referred by hospitalist)   Onset Date/Surgical Date 03/15/20    Hand Dominance Right    Next MD Visit 10/6/20021   Neurologist     Precautions   Precautions Fall    Precaution Comments pt reports he was cleared to return to driving      Balance Screen   Has the patient fallen in the past 6 months No    Has the patient had a decrease in activity level because of a fear of  falling?  No    Is the patient reluctant to leave their home because of a fear of falling?  No      Home  Environment   Family/patient expects to be discharged to: Private residence    Administrator, arts    Lives With --   Pt's mom is staying with him from [place]     Prior Function   Level of Independence Independent    Vocation Full time employment   Accounting   Leisure Occasional weightlifting in apartment complex      ADL   Eating/Feeding Modified independent    Grooming Modified independent    Upper Body Bathing Modified independent    Lower Body Bathing Modified independent    Upper Body Dressing Independent    Lower Body Dressing Modified independent    Toilet Transfer Modified independent    Tub/Shower Transfer Modified independent   Pt has tub seat that he does not use     IADL   Shopping Needs to be accompanied on any shopping trip    Light Housekeeping Performs light daily tasks but cannot maintain acceptable level of cleanliness    Meal Prep Needs to have meals  prepared and served    Medication Management Is responsible for taking medication in correct dosages at correct time      Mobility   Mobility Status --   modified I with cane     Written Expression   Dominant Hand Right    Handwriting 90% legible;Mild micrographia      Vision Assessment   Vision Assessment Vision not tested      Cognition   Overall Cognitive Status Cognition to be further assessed in functional context PRN      Observation/Other Assessments   Focus on Therapeutic Outcomes (FOTO)  Functional Intake Score:  47.37%    Other Surveys  Select    Physical Performance Test   Yes    Simulated Eating Comments 13.78 secs    Donning Doffing Jacket Comments 3 button/ unbutton: 18.25      Sensation   Light Touch Appears Intact      Coordination   Gross Motor Movements are Fluid and Coordinated No    Fine Motor Movements are Fluid and Coordinated No    9 Hole  Peg Test Right;Left    Right 9 Hole Peg Test 43.84 secs    Left 9 Hole Peg Test 24.03 secs    Box and Blocks RUE 40, LUE 55    Coordination Pt exhibits slowed movements with decreased control.      ROM / Strength   AROM / PROM / Strength AROM;Strength      AROM   Overall AROM  Within functional limits for tasks performed   RUE shoulder flex 140, LUE 145     Strength   Overall Strength Comments RUE grossly 4-4+/5, LUE 5/5      Hand Function   Right Hand Grip (lbs) 71.4    Left Hand Grip (lbs) 76.2                             OT Short Term Goals - 04/01/20 0728      OT SHORT TERM GOAL #1   Title I with HEP-04/29/20    Time 4    Period Weeks    Status New    Target Date 04/29/20      OT SHORT TERM GOAL #2   Title Pt will type 30 wpm with at least 85% accuracy.    Time 4    Period Weeks    Status New      OT SHORT TERM GOAL #3   Title Pt will verbalize understanding of adapted strategies for ADLS/IADLS to maximize safety and I prn.    Time 4    Period Weeks    Status New      OT SHORT TERM GOAL #4   Title Pt will demonstrate improved RUE fine motor coordination for ADLs as evidenced by decreasing 9 hole peg test score by 4 secs.    Baseline RUE 43.84 secs. LUE 24.03 secs    Time 4    Period Weeks    Status New      OT SHORT TERM GOAL #5   Title Pt will  demonstrate improved UE functional use for ADLs as evidenced by increasing box/ blocks score by 4 blocks with RUE    Baseline RUE 40, LUE 55    Time 4    Status New             OT Long Term Goals - 04/01/20 2778  OT LONG TERM GOAL #1   Title I with updated HEP.-05/27/20    Time 8    Period Weeks    Status New    Target Date 05/27/20      OT LONG TERM GOAL #2   Title Pt will write a short paragraph with 100% legibility and only min decrease in letter size.    Time 8    Period Weeks    Status New      OT LONG TERM GOAL #3   Title Pt will demonstrate improved fine motor  coordination for ADLs as evidenced by decreasing 9 hole peg test score for RUE by 8 secs from eval    Baseline RUE 43.84 secs. LUE 24.03 secs    Time 8    Period Weeks    Status New      OT LONG TERM GOAL #4   Title Pt will  demonstrate improved RUE functional use for ADLs as evidenced by increasing box/ blocks score by 8 blocks    Baseline RUE 40 blocks, LUE 55    Time 8    Period Weeks    Status New      OT LONG TERM GOAL #5   Title Pt will type 35 wpm with 90% or better accuracy in prep for return to work.    Time 8    Period Weeks    Status New      Long Term Additional Goals   Additional Long Term Goals Yes      OT LONG TERM GOAL #6   Title Pt will resume prior level of home management / cooking at a modified I level demonstrating good safety awareness.    Time 8    Period Weeks    Status New                 Plan - 03/31/20 1315    Clinical Impression Statement The pt is a 29 yo male presenting to ED with N&V, noted to have diffuse hyperreflexia. 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  Pt was d/c home 03/19/2020 Pt presents with the following deficits: decreased coordination, decreased strength, decreased RUE functional use, decreased functional mobility, decreased RUE control. 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 His mom is currently here assisting pt.    OT Occupational Profile and History Detailed Assessment- Review of Records and additional review of physical, cognitive, psychosocial history related to current functional performance    Occupational performance deficits (Please refer to evaluation for details): ADL's;IADL's;Work;Social Participation;Leisure    Body Structure / Function / Physical Skills ADL;UE functional  use;Balance;Flexibility;Pain;FMC;ROM;Gait;GMC;Coordination;Decreased knowledge of precautions;Decreased knowledge of use of DME;IADL;Dexterity;Mobility;Tone;Strength    Rehab Potential Good    Clinical Decision Making Limited treatment options, no task modification necessary    Comorbidities Affecting Occupational Performance: May have comorbidities impacting occupational performance    Modification or Assistance to Complete Evaluation  No modification of tasks or assist necessary to complete eval    OT Frequency 2x / week   or 16 visits total plus eval   OT Duration 8 weeks    OT Treatment/Interventions Self-care/ADL training;Ultrasound;DME and/or AE instruction;Patient/family education;Paraffin;Passive range of motion;Balance training;Cryotherapy;Fluidtherapy;Splinting;Moist Heat;Therapeutic exercise;Manual Therapy;Therapeutic activities;Neuromuscular education    Plan coordination HEP for RUE, typing    Consulted  and Agree with Plan of Care Patient;Family member/caregiver           Patient will benefit from skilled therapeutic intervention in order to improve the following deficits and impairments:   Body Structure / Function / Physical Skills: ADL, UE functional use, Balance, Flexibility, Pain, FMC, ROM, Gait, GMC, Coordination, Decreased knowledge of precautions, Decreased knowledge of use of DME, IADL, Dexterity, Mobility, Tone, Strength       Visit Diagnosis: Muscle weakness (generalized) - Plan: Ot plan of care cert/re-cert  Other lack of coordination - Plan: Ot plan of care cert/re-cert  Other symptoms and signs involving the nervous system - Plan: Ot plan of care cert/re-cert    Problem List Patient Active Problem List   Diagnosis Date Noted  . 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 Depression 03/15/2020    Breon Rehm 04/01/2020, 7:56 AM Keene Breath, OTR/L [phone] Phone: [phone] 7:56 AM  04/01/20 [facility] [address] [place], [place], [address] Phone: [phone]   Fax:  [phone]  Name: VINCIENT VANAMAN MRN: [id number] Date of Birth: 18-Oct-1990

## 2020-04-04 ENCOUNTER — Ambulatory Visit: Payer: BC Managed Care – PPO | Admitting: Occupational Therapy

## 2020-04-04 ENCOUNTER — Other Ambulatory Visit: Payer: Self-pay

## 2020-04-04 ENCOUNTER — Encounter: Payer: Self-pay | Admitting: Occupational Therapy

## 2020-04-04 DIAGNOSIS — R2681 Unsteadiness on feet: Secondary | ICD-10-CM | POA: Diagnosis not present

## 2020-04-04 DIAGNOSIS — M6281 Muscle weakness (generalized): Secondary | ICD-10-CM

## 2020-04-04 DIAGNOSIS — R278 Other lack of coordination: Secondary | ICD-10-CM

## 2020-04-04 DIAGNOSIS — R2689 Other abnormalities of gait and mobility: Secondary | ICD-10-CM

## 2020-04-04 DIAGNOSIS — R29818 Other symptoms and signs involving the nervous system: Secondary | ICD-10-CM

## 2020-04-04 NOTE — Patient Instructions (Signed)
  Coordination Activities  Perform the following activities for 20 minutes 1 times per day with right hand(s).   Rotate ball in fingertips (clockwise and counter-clockwise).  Toss ball between hands.  Toss ball in air and catch with the same hand.  Flip cards 1 at a time as fast as you can.  Deal cards with your thumb (Hold deck in hand and push card off top with thumb).  Rotate card in hand (clockwise and counter-clockwise).  Pick up coins, buttons, marbles, dried beans/pasta of different sizes and place in container.  Pick up coins and place in container or coin bank.  Pick up coins and stack.  Pick up coins one at a time until you get 5-10 in your hand, then move coins from palm to fingertips to stack one at a time.

## 2020-04-04 NOTE — Therapy (Signed)
[facility] [address] [place], [place], [address] Phone: [phone]   Fax:  [phone]  Occupational Therapy Treatment  Patient Details  Name: Jackson Terrell MRN: [id number] Date of Birth: 1991/03/07 Referring Provider (OT): Ihor Austin, NP ((pt was referred by hospitalist))   Encounter Date: 04/04/2020   OT End of Session - 04/04/20 1412    Visit Number 2    Number of Visits 17    Date for OT Re-Evaluation 05/26/20    Authorization Type BCBS    OT Start Time 1407    OT Stop Time 1447    OT Time Calculation (min) 40 min    Activity Tolerance Patient tolerated treatment well    Behavior During Therapy [facility] for tasks assessed/performed           Past Medical History:  Diagnosis Date  . Scoliosis    mild    Past Surgical History:  Procedure Laterality Date  . BUBBLE STUDY  03/18/2020   Procedure: BUBBLE STUDY;  Surgeon: Wendall Stade, MD;  Location: [facility] ENDOSCOPY;  Service: Cardiovascular;;  . TEE WITHOUT CARDIOVERSION N/A 03/18/2020   Procedure: TRANSESOPHAGEAL ECHOCARDIOGRAM (TEE);  Surgeon: Wendall Stade, MD;  Location: [facility] ENDOSCOPY;  Service: Cardiovascular;  Laterality: N/A;    There were no vitals filed for this visit.   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    Patient Stated Goals improve RUE dexterity so he get back to working from home    Currently in Pain? No/denies               OT Treatments/Exercises (OP) - 04/04/20 0001      Exercises   Exercises Hand      Cognitive Exercises   Keyboarding pt copied 14 phone numbers in 1 min 56 seconds with number pad    Handwriting handwriting reported approximately 50% of normal handwriting prior to decline. deficits noted in fatigue with RUE    Basic Math basic math addition and  subtraction 2-3 digit numbers in approximately 2 minutes. no difficulty.      Visual/Perceptual Exercises   Scanning Tabletop    Scanning - Tabletop copying phone numbers - increased time and minimal errors (<2)      Fine Motor Coordination (Hand/Wrist)   Fine Motor Coordination Manipulation of small objects    Manipulation of small objects HEP coordination RUE see pt instructions                  OT Education - 04/04/20 1411    Education Details Issued coordination HEP for RUE see pt instructions. OT deferred return to work to next session to determine readiness    Person(s) Educated Patient    Methods Explanation;Demonstration    Comprehension Returned demonstration;Verbalized understanding;Need further instruction            OT Short Term Goals - 04/01/20 0728      OT SHORT TERM GOAL #1   Title I with HEP-04/29/20    Time 4    Period Weeks    Status New    Target Date 04/29/20      OT SHORT TERM GOAL #2   Title Pt will type 30 wpm with at least 85% accuracy.    Time 4    Period Weeks  Status New      OT SHORT TERM GOAL #3   Title Pt will verbalize understanding of adapted strategies for ADLS/IADLS to maximize safety and I prn.    Time 4    Period Weeks    Status New      OT SHORT TERM GOAL #4   Title Pt will demonstrate improved RUE fine motor coordination for ADLs as evidenced by decreasing 9 hole peg test score by 4 secs.    Baseline RUE 43.84 secs. LUE 24.03 secs    Time 4    Period Weeks    Status New      OT SHORT TERM GOAL #5   Title Pt will  demonstrate improved UE functional use for ADLs as evidenced by increasing box/ blocks score by 4 blocks with RUE    Baseline RUE 40, LUE 55    Time 4    Status New             OT Long Term Goals - 04/01/20 0734      OT LONG TERM GOAL #1   Title I with updated HEP.-05/27/20    Time 8    Period Weeks    Status New    Target Date 05/27/20      OT LONG TERM GOAL #2   Title Pt will write a  short paragraph with 100% legibility and only min decrease in letter size.    Time 8    Period Weeks    Status New      OT LONG TERM GOAL #3   Title Pt will demonstrate improved fine motor coordination for ADLs as evidenced by decreasing 9 hole peg test score for RUE by 8 secs from eval    Baseline RUE 43.84 secs. LUE 24.03 secs    Time 8    Period Weeks    Status New      OT LONG TERM GOAL #4   Title Pt will  demonstrate improved RUE functional use for ADLs as evidenced by increasing box/ blocks score by 8 blocks    Baseline RUE 40 blocks, LUE 55    Time 8    Period Weeks    Status New      OT LONG TERM GOAL #5   Title Pt will type 35 wpm with 90% or better accuracy in prep for return to work.    Time 8    Period Weeks    Status New      Long Term Additional Goals   Additional Long Term Goals Yes      OT LONG TERM GOAL #6   Title Pt will resume prior level of home management / cooking at a modified I level demonstrating good safety awareness.    Time 8    Period Weeks    Status New                 Plan - 04/04/20 1410    Clinical Impression Statement Patient is hoping to return to work as soon as possible. He came into session hopeful for a release of readiness for return to working from home but OT deferred another week for assessing dual tasking and managing several steps in a task.    OT Occupational Profile and History Detailed Assessment- Review of Records and additional review of physical, cognitive, psychosocial history related to current functional performance    Occupational performance deficits (Please refer to evaluation for details): ADL's;IADL's;Work;Social Participation;Leisure    Body  Structure / Function / Physical Skills ADL;UE functional use;Balance;Flexibility;Pain;FMC;ROM;Gait;GMC;Coordination;Decreased knowledge of precautions;Decreased knowledge of use of DME;IADL;Dexterity;Mobility;Tone;Strength    Rehab Potential Good    Clinical Decision  Making Limited treatment options, no task modification necessary    Comorbidities Affecting Occupational Performance: May have comorbidities impacting occupational performance    Modification or Assistance to Complete Evaluation  No modification of tasks or assist necessary to complete eval    OT Frequency 2x / week   or 16 visits total plus eval   OT Duration 8 weeks    OT Treatment/Interventions Self-care/ADL training;Ultrasound;DME and/or AE instruction;Patient/family education;Paraffin;Passive range of motion;Balance training;Cryotherapy;Fluidtherapy;Splinting;Moist Heat;Therapeutic exercise;Manual Therapy;Therapeutic activities;Neuromuscular education    Plan typing, dual tasking    Consulted and Agree with Plan of Care Patient;Family member/caregiver           Patient will benefit from skilled therapeutic intervention in order to improve the following deficits and impairments:   Body Structure / Function / Physical Skills: ADL, UE functional use, Balance, Flexibility, Pain, FMC, ROM, Gait, GMC, Coordination, Decreased knowledge of precautions, Decreased knowledge of use of DME, IADL, Dexterity, Mobility, Tone, Strength       Visit Diagnosis: Muscle weakness (generalized)  Other lack of coordination  Other symptoms and signs involving the nervous system  Other abnormalities of gait and mobility    Problem List Patient Active Problem List   Diagnosis Date Noted  . 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 Depression 03/15/2020    Junious Dresser MOT, OTR/L  04/04/2020, 2:55 PM  [facility] [address] [place], [place], [address] Phone: [phone]   Fax:  [phone]  Name: Jackson Terrell MRN: [id number] Date of Birth: 03-21-1991

## 2020-04-05 ENCOUNTER — Ambulatory Visit: Payer: BC Managed Care – PPO

## 2020-04-05 DIAGNOSIS — M6281 Muscle weakness (generalized): Secondary | ICD-10-CM

## 2020-04-05 DIAGNOSIS — R2681 Unsteadiness on feet: Secondary | ICD-10-CM | POA: Diagnosis not present

## 2020-04-05 DIAGNOSIS — R2689 Other abnormalities of gait and mobility: Secondary | ICD-10-CM

## 2020-04-05 NOTE — Therapy (Signed)
[facility] Out[facility] Rehabilitation [facility] [address] [place], [place], [address] Phone: [phone]   Fax:  [phone]  Physical Therapy Treatment  Patient Details  Name: Jackson Terrell MRN: [id number] Date of Birth: 11/01/1990 Referring Provider (PT): Dr. Sharolyn Douglas (hospitalist) Ihor Austin, NP; Aura Dials, MD (PCP))   Encounter Date: 04/05/2020   PT End of Session - 04/05/20 1451    Visit Number 3    Number of Visits 13    Authorization Type BCBS    PT Start Time 1448    PT Stop Time 1529    PT Time Calculation (min) 41 min    Equipment Utilized During Treatment Gait belt    Activity Tolerance Patient tolerated treatment well    Behavior During Therapy [facility] for tasks assessed/performed           Past Medical History:  Diagnosis Date  . Scoliosis    mild    Past Surgical History:  Procedure Laterality Date  . BUBBLE STUDY  03/18/2020   Procedure: BUBBLE STUDY;  Surgeon: Wendall Stade, MD;  Location: [facility] ENDOSCOPY;  Service: Cardiovascular;;  . TEE WITHOUT CARDIOVERSION N/A 03/18/2020   Procedure: TRANSESOPHAGEAL ECHOCARDIOGRAM (TEE);  Surgeon: Wendall Stade, MD;  Location: [facility] ENDOSCOPY;  Service: Cardiovascular;  Laterality: N/A;    There were no vitals filed for this visit.   Subjective Assessment - 04/05/20 1451    Subjective Pt got the cane. It is a foldable one and he is getting a more stable replacement soon. Exercises have been going well and he is finding them getting easier. Hardest thing to do is walk straight.    Patient is accompained by: Family member   Mom   Patient Stated Goals Pt's goals for therapy are to improve walking straight and get R arm and hand work better.    Currently in Pain? No/denies                             [facility] Adult PT Treatment/Exercise - 04/05/20 1453      Ambulation/Gait   Ambulation/Gait Yes    Ambulation/Gait Assistance 5: Supervision    Ambulation/Gait  Assistance Details Pt was cued to focus on step placement.    Ambulation Distance (Feet) 460 Feet    Assistive device Straight cane    Gait Pattern Step-through pattern;Decreased arm swing - right    Ambulation Surface Level;Indoor      Neuro Re-ed    Neuro Re-ed Details  Dynamic gait activities over floor ladder: reciprocal steps x 2, marching x 2, side stepping x 2, diagonals stepping x 2 CGA without canc. Stepping over 4 cones with SPC x 4 bouts. In // bars: side stepping on soft blue foam beam, tandem gait on blue foam beam x 4 with occasional UE support. Standing on rockerboard ant/post with alternating toe taps on cone. Adding airex on rockerboard and then raising 3.3# med ball over head x 10 then turning board lateral and repeating holding ball in front with trunk rotation. More challenged by this.Standing on rockerboard with eyes closed x 30 sec with increased sway. CGA with all activities for safety. Standing in corner on airex with feet together eyes closed x 30 sec then adding in head turns left/right and up/down x 10 each close SBA.                  PT Education - 04/05/20 1736  Education Details Added corner balance exercises to HEP    Person(s) Educated Patient    Methods Explanation;Demonstration;Handout    Comprehension Verbalized understanding;Returned demonstration            PT Short Term Goals - 03/25/20 1603      PT SHORT TERM GOAL #1   Title Pt will be independent with HEP for improved strength, balance, transfers, and gait.  TARGET 04/22/2020    Time 4    Period Weeks    Status New      PT SHORT TERM GOAL #2   Title Pt will improve TUG score to less than or equal to 13.5 sec with no device, for decreased fall risk.    Time 4    Period Weeks    Status New      PT SHORT TERM GOAL #3   Title Pt will improve SLS to at least 3 seconds each lower extremity for improved functional strength and balance.    Time 4    Period Weeks    Status New      PT  SHORT TERM GOAL #4   Title Pt will improve DGI score to at least 16/24 for decreased fall risk.    Time 4    Period Weeks    Status New      PT SHORT TERM GOAL #5   Title Pt will verbalize understanding of fall prevention education for home environment.    Time 4    Period Weeks    Status New             PT Long Term Goals - 03/25/20 1605      PT LONG TERM GOAL #1   Title Pt will be independent with HEP for improved strength, balance, transfers, and gait.  TARGET 05/06/2020    Time 6    Period Weeks    Status New      PT LONG TERM GOAL #2   Title Pt will improve gait velocity to at least 3 ft/sec for improved gait efficiency and safety in community.    Time 6    Period Weeks    Status New      PT LONG TERM GOAL #3   Title Pt will improve DGI score to at least 19/24 for decreased fall risk.    Time 6    Period Weeks    Status New      PT LONG TERM GOAL #4   Title Pt will ambulate at least 1000 ft, indoor and outdoor surfaces, no device, independently, for safe return to independent community giat.    Time 6    Period Weeks    Status New      PT LONG TERM GOAL #5   Title Pt will report improvement in FOTO score at d/c by at least 20% for improved overall functional mobility.    Baseline 47.37%    Time 6    Period Weeks    Status New                 Plan - 04/05/20 1736    Clinical Impression Statement Pt showing improving stability with gait with SPC. He was challenged some with increasing SLS time but needed less assistance overall. Increased sway on compliant surfaces.    Personal Factors and Comorbidities Other;Comorbidity 1   HTN (BP elevated during PT session; almost to parameter unable to treat)   Comorbidities Hx of depression    Examination-Activity Limitations Locomotion  Level;Stairs;Stand    Examination-Participation Restrictions Occupation;Community Activity    Stability/Clinical Decision Making Stable/Uncomplicated    Rehab Potential Good      PT Frequency 2x / week    PT Duration 6 weeks   plus eval   PT Treatment/Interventions ADLs/Self Care Home Management;DME Instruction;Gait training;Stair training;Functional mobility training;Therapeutic activities;Therapeutic exercise;Balance training;Neuromuscular re-education;Patient/family education;Vestibular    PT Next Visit Plan Continue gait training with SPC. Continue dynamic balance with SLS activities, coordination activities, functional strengthening.    Consulted and Agree with Plan of Care Patient           Patient will benefit from skilled therapeutic intervention in order to improve the following deficits and impairments:  Abnormal gait, Difficulty walking, Dizziness, Decreased balance, Decreased mobility, Decreased strength  Visit Diagnosis: Muscle weakness (generalized)  Other abnormalities of gait and mobility     Problem List Patient Active Problem List   Diagnosis Date Noted  . 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 Depression 03/15/2020    Ronn Melena, PT, DPT, NCS 04/05/2020, 5:39 PM  [facility] [address] [place], [place], [address] Phone: [phone]   Fax:  [phone]  Name: KALETH KOY MRN: [id number] Date of Birth: [birth year]

## 2020-04-05 NOTE — Patient Instructions (Signed)
Access Code: [email or web address] URL: [email or web address] Date: 04/05/2020 Prepared by: Elmer Bales  Exercises Walking March - 2 x daily - 7 x weekly - 1 sets - 3 reps Side Stepping with Counter Support - 2 x daily - 7 x weekly - 1 sets - 3 reps Walking Tandem Stance - 2 x daily - 7 x weekly - 1 sets - 3 reps Standing head turns - 2 x daily - 7 x weekly - 2 sets - 10 reps Romberg Stance with Head Nods on Foam Pad - 2 x daily - 7 x weekly - 2 sets - 10 reps

## 2020-04-06 ENCOUNTER — Other Ambulatory Visit: Payer: Self-pay | Admitting: *Deleted

## 2020-04-06 NOTE — Patient Outreach (Signed)
[facility]) Care Management  04/06/2020  Jackson Terrell Feb 11, 1991 [id number]   Subjective: Telephone call to patient's home  / mobile number, no answer, left HIPAA compliant voicemail message, and requested call back.    Objective: Per KPN (Knowledge Performance Now, point of care tool) and chart review, patient hospitalized 03/15/2020 - 03/19/2020 for Serotonin syndrome,  Cerebral embolism with cerebral infarction.    Patient also has a history of depression.     Assessment: Received Self Pay EMMI Stroke Red Flag Alert follow up referral on 03/22/2020.  Red Flag Alert Trigger, Day #1, patient answered no to the following question: Scheduled a follow-up appointment?   EMMI follow up completed and will follow up to assess further care management needs.      Plan: RNCM will call patient for 2nd telephone outreach attempt, within 4 business days, EMMI follow up, to assess for further CM needs, and proceed with case closure, after 4th unsuccessful outreach call.       Krystl Wickware H. Gardiner Barefoot, BSN, CCM [facility] Care Management [facility] Telephonic CM Phone: [phone] Fax: [phone]

## 2020-04-08 ENCOUNTER — Ambulatory Visit: Payer: BC Managed Care – PPO

## 2020-04-08 ENCOUNTER — Other Ambulatory Visit: Payer: Self-pay

## 2020-04-08 ENCOUNTER — Other Ambulatory Visit: Payer: Self-pay | Admitting: *Deleted

## 2020-04-08 DIAGNOSIS — R2689 Other abnormalities of gait and mobility: Secondary | ICD-10-CM

## 2020-04-08 DIAGNOSIS — M6281 Muscle weakness (generalized): Secondary | ICD-10-CM

## 2020-04-08 DIAGNOSIS — R2681 Unsteadiness on feet: Secondary | ICD-10-CM | POA: Diagnosis not present

## 2020-04-08 NOTE — Therapy (Signed)
[facility] Out[facility] Rehabilitation [facility] [address] [place], [place], [address] Phone: [phone]   Fax:  [phone]  Physical Therapy Treatment  Patient Details  Name: Jackson Terrell MRN: [id number] Date of Birth: Jun 05, 1991 Referring Provider (PT): Dr. Sharolyn Douglas (hospitalist) Ihor Austin, NP; Aura Dials, MD (PCP))   Encounter Date: 04/08/2020   PT End of Session - 04/08/20 1451    Visit Number 4    Number of Visits 13    Authorization Type BCBS    PT Start Time 1449    PT Stop Time 1530    PT Time Calculation (min) 41 min    Equipment Utilized During Treatment Gait belt    Activity Tolerance Patient tolerated treatment well    Behavior During Therapy [facility] for tasks assessed/performed           Past Medical History:  Diagnosis Date  . Scoliosis    mild    Past Surgical History:  Procedure Laterality Date  . BUBBLE STUDY  03/18/2020   Procedure: BUBBLE STUDY;  Surgeon: Wendall Stade, MD;  Location: [facility] ENDOSCOPY;  Service: Cardiovascular;;  . TEE WITHOUT CARDIOVERSION N/A 03/18/2020   Procedure: TRANSESOPHAGEAL ECHOCARDIOGRAM (TEE);  Surgeon: Wendall Stade, MD;  Location: [facility] ENDOSCOPY;  Service: Cardiovascular;  Laterality: N/A;    There were no vitals filed for this visit.   Subjective Assessment - 04/08/20 1451    Subjective Pt reports doing well. Got the new cane that does not wiggle.    Patient is accompained by: Family member   Mom   Patient Stated Goals Pt's goals for therapy are to improve walking straight and get R arm and hand work better.    Currently in Pain? No/denies                             [facility] Adult PT Treatment/Exercise - 04/08/20 1452      Ambulation/Gait   Ambulation/Gait Yes    Ambulation/Gait Assistance 5: Supervision    Ambulation/Gait Assistance Details Pt ambulated up/down grassy hill outside as well.  Pt with reciprocal pattern and only slight ataxia on right noted a  couple times.    Ambulation Distance (Feet) 1000 Feet    Assistive device None    Gait Pattern Step-through pattern    Ambulation Surface Level;Indoor      Neuro Re-ed    Neuro Re-ed Details  In // bars: tandem gait along soft blue foam beam x 3 laps then side stepping along beam x 3 laps. Standing on soft blue foam beam eyes closed 30 sec x 2 with increased sway. Standing on flat side of BOSU ball maintaining level 30 sec x 2 with increased sway and CGA/min assist for safety. Standing on rockerboard positioned ant/post trying to maintain level x 30 sec with with alternating toe taps on cone x 10 each leg CGA, then changed to lateral position and performed standing x 30 sec then adding in holding 2.2# ball out front and trunk rotation 10 x 2 to each side. Pt with improving control the second time. Was cued to tighten tummy to use core to help. CGA for safety.  Dynamic gait in hallway: tandem gait 40' x 2, marching gait with slow march touching opposite knee with hand each march x 40' then backwards gait x 40' CGA. Pt had 1 episode of staggering with backwards gait.    BP=132/88 at end.  PT Short Term Goals - 03/25/20 1603      PT SHORT TERM GOAL #1   Title Pt will be independent with HEP for improved strength, balance, transfers, and gait.  TARGET 04/22/2020    Time 4    Period Weeks    Status New      PT SHORT TERM GOAL #2   Title Pt will improve TUG score to less than or equal to 13.5 sec with no device, for decreased fall risk.    Time 4    Period Weeks    Status New      PT SHORT TERM GOAL #3   Title Pt will improve SLS to at least 3 seconds each lower extremity for improved functional strength and balance.    Time 4    Period Weeks    Status New      PT SHORT TERM GOAL #4   Title Pt will improve DGI score to at least 16/24 for decreased fall risk.    Time 4    Period Weeks    Status New      PT SHORT TERM GOAL #5   Title Pt will verbalize  understanding of fall prevention education for home environment.    Time 4    Period Weeks    Status New             PT Long Term Goals - 03/25/20 1605      PT LONG TERM GOAL #1   Title Pt will be independent with HEP for improved strength, balance, transfers, and gait.  TARGET 05/06/2020    Time 6    Period Weeks    Status New      PT LONG TERM GOAL #2   Title Pt will improve gait velocity to at least 3 ft/sec for improved gait efficiency and safety in community.    Time 6    Period Weeks    Status New      PT LONG TERM GOAL #3   Title Pt will improve DGI score to at least 19/24 for decreased fall risk.    Time 6    Period Weeks    Status New      PT LONG TERM GOAL #4   Title Pt will ambulate at least 1000 ft, indoor and outdoor surfaces, no device, independently, for safe return to independent community giat.    Time 6    Period Weeks    Status New      PT LONG TERM GOAL #5   Title Pt will report improvement in FOTO score at d/c by at least 20% for improved overall functional mobility.    Baseline 47.37%    Time 6    Period Weeks    Status New                 Plan - 04/08/20 1540    Clinical Impression Statement Pt was able to increase gait distance outdoors today on varied surfaces without AD supervision. He continues to show improving control of right foot placement. Reports he does still note some staggering at times but feels he just really thinks about it when does occur. Pt continues to be more challenged with balance on compliant surfaces.    Personal Factors and Comorbidities Other;Comorbidity 1   HTN (BP elevated during PT session; almost to parameter unable to treat)   Comorbidities Hx of depression    Examination-Activity Limitations Locomotion Level;Stairs;Stand    Examination-Participation Restrictions Occupation;Community  Activity    Stability/Clinical Decision Making Stable/Uncomplicated    Rehab Potential Good    PT Frequency 2x / week     PT Duration 6 weeks   plus eval   PT Treatment/Interventions ADLs/Self Care Home Management;DME Instruction;Gait training;Stair training;Functional mobility training;Therapeutic activities;Therapeutic exercise;Balance training;Neuromuscular re-education;Patient/family education;Vestibular    PT Next Visit Plan Continue gait training with SPC. Continue dynamic balance with SLS activities, coordination activities,Balance on compliant surfaces,  functional strengthening.    Consulted and Agree with Plan of Care Patient           Patient will benefit from skilled therapeutic intervention in order to improve the following deficits and impairments:  Abnormal gait, Difficulty walking, Dizziness, Decreased balance, Decreased mobility, Decreased strength  Visit Diagnosis: Other abnormalities of gait and mobility  Muscle weakness (generalized)     Problem List Patient Active Problem List   Diagnosis Date Noted  . 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 Depression 03/15/2020    Ronn Melena, PT, DPT, NCS 04/08/2020, 3:42 PM  [facility] [address] [place], [place], [address] Phone: [phone]   Fax:  [phone]  Name: Jackson Terrell MRN: [id number] Date of Birth: 20-Feb-1991

## 2020-04-08 NOTE — Patient Outreach (Signed)
[facility]) Care Management  04/08/2020  Jackson Terrell 1991-06-13 [id number]   Subjective: Received voicemail message from Jodelle Red, states he is returning call, and requested call back.  Telephone call to patient's home  / mobile number, no answer, left HIPAA compliant voicemail message, and requested call back.    Objective:Per KPN (Knowledge Performance Now, point of care tool) and chart review,patient hospitalized 03/15/2020 - 03/19/2020 forSerotonin syndrome,Cerebral embolism with cerebral infarction. Patient also has a history of depression.    Assessment:Received Self Pay EMMI Stroke Red Flag Alert follow up referral on 03/22/2020. Red Flag Alert Trigger, Day #1, patient answered no to the following question: Scheduled a follow-up appointment?EMMI follow up completed and will follow up to assess further care management needs.      Plan:RNCM will send unsuccessful outreach letter, [facility] pamphlet, handout: Know Before You Go, will call patient for 3rd telephone outreach attempt, within4business days, EMMI follow up, to assess for further CM needs, and proceed with case closure,after 4th unsuccessful outreach call.      Ana Woodroof H. Gardiner Barefoot, BSN, CCM [facility] Care Management [facility] Telephonic CM Phone: [phone] Fax: [phone]

## 2020-04-11 ENCOUNTER — Other Ambulatory Visit: Payer: Self-pay

## 2020-04-11 ENCOUNTER — Other Ambulatory Visit: Payer: Self-pay | Admitting: *Deleted

## 2020-04-11 ENCOUNTER — Ambulatory Visit: Payer: BC Managed Care – PPO | Admitting: Occupational Therapy

## 2020-04-11 DIAGNOSIS — M6281 Muscle weakness (generalized): Secondary | ICD-10-CM

## 2020-04-11 DIAGNOSIS — R29818 Other symptoms and signs involving the nervous system: Secondary | ICD-10-CM

## 2020-04-11 DIAGNOSIS — R2681 Unsteadiness on feet: Secondary | ICD-10-CM | POA: Diagnosis not present

## 2020-04-11 DIAGNOSIS — R278 Other lack of coordination: Secondary | ICD-10-CM

## 2020-04-11 NOTE — Therapy (Signed)
[facility] [address] [place], [place], [address] Phone: [phone]   Fax:  [phone]  Occupational Therapy Treatment  Patient Details  Name: Jackson Terrell MRN: [id number] Date of Birth: [birth year] Referring Provider (OT): Ihor Austin, NP ((pt was referred by hospitalist))   Encounter Date: 04/11/2020   OT End of Session - 04/11/20 1423    Visit Number 3    Number of Visits 17    Date for OT Re-Evaluation 05/26/20    Authorization Type BCBS    OT Start Time 1405    OT Stop Time 1445    OT Time Calculation (min) 40 min    Activity Tolerance Patient tolerated treatment well    Behavior During Therapy [facility] for tasks assessed/performed           Past Medical History:  Diagnosis Date  . Scoliosis    mild    Past Surgical History:  Procedure Laterality Date  . BUBBLE STUDY  03/18/2020   Procedure: BUBBLE STUDY;  Surgeon: Wendall Stade, MD;  Location: [facility] ENDOSCOPY;  Service: Cardiovascular;;  . TEE WITHOUT CARDIOVERSION N/A 03/18/2020   Procedure: TRANSESOPHAGEAL ECHOCARDIOGRAM (TEE);  Surgeon: Wendall Stade, MD;  Location: [facility] ENDOSCOPY;  Service: Cardiovascular;  Laterality: N/A;    There were no vitals filed for this visit.   Subjective Assessment - 04/11/20 1423    Subjective  I'm walking a little better    Patient Stated Goals improve RUE dexterity so he get back to working from home    Currently in Pain? No/denies                   Treatment: Typing test 34 wpm, 95% accuracy Typing activities for increased speed and dexterity with typing, word tris and clouds. Copying small peg design on vertical surface with RUE min difficulty/ drops. Dual tasking to ambulate while tossing ball and naming animals for each letter of the alphabet, min-mod v.c for category generation.  Constant therapy alternating attention task to alphabatize words 97% accuracy level 1 Pt was issued organizing  your day task for homework.                OT Short Term Goals - 04/01/20 0728      OT SHORT TERM GOAL #1   Title I with HEP-04/29/20    Time 4    Period Weeks    Status New    Target Date 04/29/20      OT SHORT TERM GOAL #2   Title Pt will type 30 wpm with at least 85% accuracy.    Time 4    Period Weeks    Status New      OT SHORT TERM GOAL #3   Title Pt will verbalize understanding of adapted strategies for ADLS/IADLS to maximize safety and I prn.    Time 4    Period Weeks    Status New      OT SHORT TERM GOAL #4   Title Pt will demonstrate improved RUE fine motor coordination for ADLs as evidenced by decreasing 9 hole peg test score by 4 secs.    Baseline RUE 43.84 secs. LUE 24.03 secs    Time 4    Period Weeks    Status New      OT SHORT TERM GOAL #5   Title Pt will  demonstrate improved UE functional use for ADLs as evidenced by increasing box/ blocks score by 4 blocks with RUE  Baseline RUE 40, LUE 55    Time 4    Status New             OT Long Term Goals - 04/01/20 0734      OT LONG TERM GOAL #1   Title I with updated HEP.-05/27/20    Time 8    Period Weeks    Status New    Target Date 05/27/20      OT LONG TERM GOAL #2   Title Pt will write a short paragraph with 100% legibility and only min decrease in letter size.    Time 8    Period Weeks    Status New      OT LONG TERM GOAL #3   Title Pt will demonstrate improved fine motor coordination for ADLs as evidenced by decreasing 9 hole peg test score for RUE by 8 secs from eval    Baseline RUE 43.84 secs. LUE 24.03 secs    Time 8    Period Weeks    Status New      OT LONG TERM GOAL #4   Title Pt will  demonstrate improved RUE functional use for ADLs as evidenced by increasing box/ blocks score by 8 blocks    Baseline RUE 40 blocks, LUE 55    Time 8    Period Weeks    Status New      OT LONG TERM GOAL #5   Title Pt will type 35 wpm with 90% or better accuracy in prep for return  to work.    Time 8    Period Weeks    Status New      Long Term Additional Goals   Additional Long Term Goals Yes      OT LONG TERM GOAL #6   Title Pt will resume prior level of home management / cooking at a modified I level demonstrating good safety awareness.    Time 8    Period Weeks    Status New                 Plan - 04/11/20 1424    Clinical Impression Statement Pt is progressing towards goals. With improving RUE fine motor coordination. Pt was able to type 34 wpm with 95% accuracy.    OT Occupational Profile and History Detailed Assessment- Review of Records and additional review of physical, cognitive, psychosocial history related to current functional performance    Occupational performance deficits (Please refer to evaluation for details): ADL's;IADL's;Work;Social Participation;Leisure    Body Structure / Function / Physical Skills ADL;UE functional use;Balance;Flexibility;Pain;FMC;ROM;Gait;GMC;Coordination;Decreased knowledge of precautions;Decreased knowledge of use of DME;IADL;Dexterity;Mobility;Tone;Strength    Rehab Potential Good    Clinical Decision Making Limited treatment options, no task modification necessary    Comorbidities Affecting Occupational Performance: May have comorbidities impacting occupational performance    Modification or Assistance to Complete Evaluation  No modification of tasks or assist necessary to complete eval    OT Frequency 2x / week   or 16 visits total plus eval   OT Duration 8 weeks    OT Treatment/Interventions Self-care/ADL training;Ultrasound;DME and/or AE instruction;Patient/family education;Paraffin;Passive range of motion;Balance training;Cryotherapy;Fluidtherapy;Splinting;Moist Heat;Therapeutic exercise;Manual Therapy;Therapeutic activities;Neuromuscular education    Plan typing, dual tasking    Consulted and Agree with Plan of Care Patient;Family member/caregiver           Patient will benefit from skilled therapeutic  intervention in order to improve the following deficits and impairments:   Body Structure / Function / Physical  Skills: ADL, UE functional use, Balance, Flexibility, Pain, FMC, ROM, Gait, GMC, Coordination, Decreased knowledge of precautions, Decreased knowledge of use of DME, IADL, Dexterity, Mobility, Tone, Strength       Visit Diagnosis: Other lack of coordination  Muscle weakness (generalized)  Other symptoms and signs involving the nervous system  Unsteadiness on feet    Problem List Patient Active Problem List   Diagnosis Date Noted  . 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 Depression 03/15/2020    Micayla Brathwaite 04/11/2020, 2:26 PM  [facility] [address] [place], [place], [address] Phone: [phone]   Fax:  [phone]  Name: Jackson Terrell MRN: [id number] Date of Birth: Feb 02, 1991

## 2020-04-11 NOTE — Patient Outreach (Signed)
[facility]) Care Management  04/11/2020  Jackson Terrell [birth year] [id number]   Subjective:Received voicemail message from Jackson Terrell, states he is returning call, and requested call back.  Telephone call to patient's home / mobile number, no answer, left HIPAA compliant voicemail message, and requested call back. Telephone call from patient's home / mobile number, spoke with patient, and HIPAA verified.  Patient states he is doing well, walking better without cane most of the time, has straight cane with him at all times for safety.   States his right arm weakness is improving and outpatient rehab going great.    States his 03/23/2020 primary MD visit went well and provider is in agreement that patient had a stroke.   States he is aware of Psychiatrist outpatient follow up recommendation by hospital MD,  states it was not mandatory,  is planning to follow up after medical bills are paid, and / or insurance issue is resolved. Patient states he does not have any education material, EMMI follow up, care coordination, care management, disease monitoring, transportation, community resource, or pharmacy needs at this time.  States he is very appreciative of the follow up, is in agreement  to receive 1 additional follow up call to assess for further CM needs after 04/20/2020 neurologist appointment, and is in agreement to receive [facility] Care Management EMMI follow up calls as needed.       Objective:Per KPN (Knowledge Performance Now, point of care tool) and chart review,patient hospitalized 03/15/2020 - 03/19/2020 forSerotonin syndrome,Cerebral embolism with cerebral infarction. Patient also has a history of depression.    Assessment:Received Self Pay EMMI Stroke Red Flag Alert follow up referral on 03/22/2020. Red Flag Alert Trigger, Day #1, patient answered no to the following question: Scheduled a follow-up appointment?EMMI follow up completed and will follow up  to assess further care management needs.      Plan:RNCM  will call patient fortelephone outreach attempt, within14business days, EMMI follow up, to assess for further CM needs, and proceed with case closure,after 4th unsuccessful outreach call.      Yzabella Crunk H. Gardiner Barefoot, BSN, CCM [facility] Care Management [facility] Telephonic CM Phone: [phone] Fax: [phone]

## 2020-04-13 ENCOUNTER — Encounter: Payer: Self-pay | Admitting: Physical Therapy

## 2020-04-13 ENCOUNTER — Ambulatory Visit: Payer: BC Managed Care – PPO | Admitting: Physical Therapy

## 2020-04-13 ENCOUNTER — Encounter: Payer: Self-pay | Admitting: Occupational Therapy

## 2020-04-13 ENCOUNTER — Ambulatory Visit: Payer: BC Managed Care – PPO | Admitting: Occupational Therapy

## 2020-04-13 ENCOUNTER — Other Ambulatory Visit: Payer: Self-pay

## 2020-04-13 ENCOUNTER — Ambulatory Visit: Payer: BC Managed Care – PPO | Admitting: Speech Pathology

## 2020-04-13 ENCOUNTER — Encounter: Payer: Self-pay | Admitting: Speech Pathology

## 2020-04-13 DIAGNOSIS — R29818 Other symptoms and signs involving the nervous system: Secondary | ICD-10-CM

## 2020-04-13 DIAGNOSIS — R2681 Unsteadiness on feet: Secondary | ICD-10-CM

## 2020-04-13 DIAGNOSIS — M6281 Muscle weakness (generalized): Secondary | ICD-10-CM

## 2020-04-13 DIAGNOSIS — R2689 Other abnormalities of gait and mobility: Secondary | ICD-10-CM

## 2020-04-13 DIAGNOSIS — R278 Other lack of coordination: Secondary | ICD-10-CM

## 2020-04-13 DIAGNOSIS — R471 Dysarthria and anarthria: Secondary | ICD-10-CM

## 2020-04-13 NOTE — Therapy (Signed)
[facility] [address] [place], [place], [address] Phone: [phone]   Fax:  [phone]  Occupational Therapy Treatment  Patient Details  Name: Jackson Terrell MRN: [id number] Date of Birth: 02-18-1991 Referring Provider (OT): Frann Rider, NP ((pt was referred by hospitalist))   Encounter Date: 04/13/2020   OT End of Session - 04/13/20 1756    Visit Number 4    Number of Visits 17    Date for OT Re-Evaluation 05/26/20    Authorization Type BCBS    OT Start Time 1321    OT Stop Time 1400    OT Time Calculation (min) 39 min    Activity Tolerance Patient tolerated treatment well    Behavior During Therapy [facility] for tasks assessed/performed           Past Medical History:  Diagnosis Date  . Scoliosis    mild    Past Surgical History:  Procedure Laterality Date  . BUBBLE STUDY  03/18/2020   Procedure: BUBBLE STUDY;  Surgeon: Josue Hector, MD;  Location: [place];  Service: Cardiovascular;;  . TEE WITHOUT CARDIOVERSION N/A 03/18/2020   Procedure: TRANSESOPHAGEAL ECHOCARDIOGRAM (TEE);  Surgeon: Josue Hector, MD;  Location: [facility] ENDOSCOPY;  Service: Cardiovascular;  Laterality: N/A;    There were no vitals filed for this visit.   Subjective Assessment - 04/13/20 1322    Subjective  Mom is staying with me until early November    Patient Stated Goals improve RUE dexterity so he get back to working from home    Currently in Pain? No/denies                        OT Treatments/Exercises (OP) - 04/13/20 1754      Neurological Re-education Exercises   Other Exercises 1 Patient expressing improvement with dizziness, balance, and even coordiantion.  See results for 9 hole peg test and box and blocks - 2 short term goals met.  Worked on in Recruitment consultant today and RUE conditioning skiils - walking ball up and down wall.  Patient complains of right arm feeling heavy with very little  work.  Encouraged patient to continue to work to improve typing speed and accuracy 0 reviewed goal of 30 wpm/ 85% accuracy.                      OT Short Term Goals - 04/13/20 1758      OT SHORT TERM GOAL #1   Title I with HEP-04/29/20    Time 4    Period Weeks    Status On-going    Target Date 04/29/20      OT SHORT TERM GOAL #2   Title Pt will type 30 wpm with at least 85% accuracy.    Time 4    Period Weeks    Status Achieved      OT SHORT TERM GOAL #3   Title Pt will verbalize understanding of adapted strategies for ADLS/IADLS to maximize safety and I prn.    Time 4    Period Weeks    Status On-going      OT SHORT TERM GOAL #4   Title Pt will demonstrate improved RUE fine motor coordination for ADLs as evidenced by decreasing 9 hole peg test score by 4 secs.    Baseline RUE 43.84 secs. LUE 24.03 secs    Time 4    Period Weeks  Status Achieved      OT SHORT TERM GOAL #5   Title Pt will  demonstrate improved UE functional use for ADLs as evidenced by increasing box/ blocks score by 4 blocks with RUE    Baseline RUE 40, LUE 55    Time 4    Status Achieved             OT Long Term Goals - 04/13/20 1759      OT LONG TERM GOAL #1   Title I with updated HEP.-05/27/20    Time 8    Period Weeks    Status On-going      OT LONG TERM GOAL #2   Title Pt will write a short paragraph with 100% legibility and only min decrease in letter size.    Time 8    Period Weeks    Status On-going      OT LONG TERM GOAL #3   Title Pt will demonstrate improved fine motor coordination for ADLs as evidenced by decreasing 9 hole peg test score for RUE by 8 secs from eval    Baseline RUE 43.84 secs. LUE 24.03 secs    Time 8    Period Weeks    Status On-going      OT LONG TERM GOAL #4   Title Pt will  demonstrate improved RUE functional use for ADLs as evidenced by increasing box/ blocks score by 8 blocks    Baseline RUE 40 blocks, LUE 55    Time 8    Period Weeks      Status Achieved      OT LONG TERM GOAL #5   Title Pt will type 35 wpm with 90% or better accuracy in prep for return to work.    Time 8    Period Weeks    Status On-going      OT LONG TERM GOAL #6   Title Pt will resume prior level of home management / cooking at a modified I level demonstrating good safety awareness.    Time 8    Period Weeks    Status On-going                 Plan - 04/13/20 1757    Clinical Impression Statement Pt continues to progress towards goals.  Pt met 2 STG's.  Pt demonstrates difficulty with dual tasksing to perform a physical and cognitive task.    OT Occupational Profile and History Detailed Assessment- Review of Records and additional review of physical, cognitive, psychosocial history related to current functional performance    Occupational performance deficits (Please refer to evaluation for details): ADL's;IADL's;Work;Social Participation;Leisure    Body Structure / Function / Physical Skills ADL;UE functional use;Balance;Flexibility;Pain;FMC;ROM;Gait;GMC;Coordination;Decreased knowledge of precautions;Decreased knowledge of use of DME;IADL;Dexterity;Mobility;Tone;Strength    Rehab Potential Good    Clinical Decision Making Limited treatment options, no task modification necessary    Comorbidities Affecting Occupational Performance: May have comorbidities impacting occupational performance    Modification or Assistance to Complete Evaluation  No modification of tasks or assist necessary to complete eval    OT Frequency 2x / week   or 16 visits total plus eval   OT Duration 8 weeks    OT Treatment/Interventions Self-care/ADL training;Ultrasound;DME and/or AE instruction;Patient/family education;Paraffin;Passive range of motion;Balance training;Cryotherapy;Fluidtherapy;Splinting;Moist Heat;Therapeutic exercise;Manual Therapy;Therapeutic activities;Neuromuscular education    Plan simple cooking task, look at safety, RUE coordination with  alternating attention, check homework organizing your day    Consulted and Agree with Plan of Care  Patient;Family member/caregiver           Patient will benefit from skilled therapeutic intervention in order to improve the following deficits and impairments:   Body Structure / Function / Physical Skills: ADL, UE functional use, Balance, Flexibility, Pain, FMC, ROM, Gait, GMC, Coordination, Decreased knowledge of precautions, Decreased knowledge of use of DME, IADL, Dexterity, Mobility, Tone, Strength       Visit Diagnosis: Other lack of coordination  Muscle weakness (generalized)  Other symptoms and signs involving the nervous system  Unsteadiness on feet    Problem List Patient Active Problem List   Diagnosis Date Noted  . 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 Depression 03/15/2020    Mariah Milling , OTR/L 04/13/2020, 6:00 PM  [facility] [address] [place], [place], [address] Phone: [phone]   Fax:  [phone]  Name: Jackson Terrell MRN: [id number] Date of Birth: 1991/05/30

## 2020-04-13 NOTE — Patient Instructions (Addendum)
   Back sounds - k,g  Consonant clusters, fricative and affricates (sh, zh, dj,ch)  Speech exercises - do 5x each, x2-3/day SLOW BIG  SAY THE FOLLOWING- make every sound! Red leather, yellow leather     Purple baby carriage    [place] [employer] copper coffee pot Ripe purple cabbage Three free throws [place] [employer], Blue Bulb Flash Message Six Thick Thistles Stick Double Bubble Gum Cinnamon aluminum linoleum Black bugs blood Lovely lemon linament Tying Tape Takes Time A Shifty Salt Shaker   Four floors to cover Unique [place] A Three Toed Tree Toad Knapsack Strap Snap Rubber Baby Buggy Bumpers [place]  [place] [employer]ailors Sailed the [place] Which Wrist Watches are [place] Wrist Watches   Write down phrases that you would say to clients or vendors at work  Ex: The payment is confirmed and has cleared the account  Feel free to use specific terms, such as company names or peoples names  Write down words that really give you trouble  Energy conservation will be key in maintaining good speech once you return to work full time  Don't plan on going out after work or doing too much on the weekends - including too much gaming  Rest on your lunch hour and after you talk for a while, then don't talk for a while  Mindfulness and meditation to reduce stress and anxiety

## 2020-04-14 NOTE — Therapy (Signed)
[facility] Outpt Rehabilitation [facility] [address] [place], [place], [address] Phone: [phone]   Fax:  [phone]  Physical Therapy Treatment  Patient Details  Name: Jackson Terrell MRN: [id number] Date of Birth: [birth year] Referring Provider (PT): Dr. Sharolyn Douglas (hospitalist) Ihor Austin, NP; Aura Dials, MD (PCP))   Encounter Date: 04/13/2020   PT End of Session - 04/13/20 1403    Visit Number 5    Number of Visits 13    Authorization Type BCBS    PT Start Time 1400    PT Stop Time 1442    PT Time Calculation (min) 42 min    Equipment Utilized During Treatment Gait belt    Activity Tolerance Patient tolerated treatment well    Behavior During Therapy [facility] for tasks assessed/performed           Past Medical History:  Diagnosis Date  . Scoliosis    mild    Past Surgical History:  Procedure Laterality Date  . BUBBLE STUDY  03/18/2020   Procedure: BUBBLE STUDY;  Surgeon: Wendall Stade, MD;  Location: [facility] ENDOSCOPY;  Service: Cardiovascular;;  . TEE WITHOUT CARDIOVERSION N/A 03/18/2020   Procedure: TRANSESOPHAGEAL ECHOCARDIOGRAM (TEE);  Surgeon: Wendall Stade, MD;  Location: [facility] ENDOSCOPY;  Service: Cardiovascular;  Laterality: N/A;    There were no vitals filed for this visit.   Subjective Assessment - 04/13/20 1401    Subjective No new complaints. No falls or pain to report. Walking up to a mile with mom, mostly carrying the cane.    Patient is accompained by: Family member   mom in car   Patient Stated Goals Pt's goals for therapy are to improve walking straight and get R arm and hand work better.    Currently in Pain? No/denies                [facility] Adult PT Treatment/Exercise - 04/13/20 1404      Transfers   Transfers Sit to Stand;Stand to Sit    Sit to Stand 6: Modified independent (Device/Increase time)    Stand to Sit 6: Modified independent (Device/Increase time)      Ambulation/Gait   Ambulation/Gait  Yes    Ambulation/Gait Assistance 5: Supervision    Ambulation/Gait Assistance Details pt carrying cane in hand, did not use. one episode of right toe scuffing with pt self correcting balance.     Ambulation Distance (Feet) 1500 Feet    Assistive device None;Straight cane    Gait Pattern Step-through pattern    Ambulation Surface Level;Indoor;Unlevel;Gravel;Grass;Other (comment)   large grass hill at back of clinic     High Level Balance   High Level Balance Activities Side stepping;Marching forwards;Marching backwards;Tandem walking   side stepping in crouch position; tandem fwd/bwd   High Level Balance Comments on red/blue mats next to counter top: 3 laps each/each way with cues on form, technique and weight shiting. min guard assist with occasional to light UE support on counter for balance assistance.       Neuro Re-ed    Neuro Re-ed Details  for strenghening/muscle re-ed: on BOSU blue side up- fwd step up with contralateral march for 10 reps each side, then floor<>bosu<>floor lateral stepping over it both ways for 10 reps toward each way with light UE support on bars; bosu flat side up- rocking fwd/bwd for 10 reps, then laterally for 10 reps each side with light fingertip support on bars. single leg stance on center for contralateral kicks fwd/lateral/bwd for  5 reps each side with bil light UE support on bars; then with a wide stance for EC 30 sec's x 3 reps, progressing to EC head movements all left<>right, up<>down and diagonal both ways for 10 reps each with light UE support on bars. min guard assist for safety with cues on posture and weight shifting.                     PT Short Term Goals - 03/25/20 1603      PT SHORT TERM GOAL #1   Title Pt will be independent with HEP for improved strength, balance, transfers, and gait.  TARGET 04/22/2020    Time 4    Period Weeks    Status New      PT SHORT TERM GOAL #2   Title Pt will improve TUG score to less than or equal to 13.5  sec with no device, for decreased fall risk.    Time 4    Period Weeks    Status New      PT SHORT TERM GOAL #3   Title Pt will improve SLS to at least 3 seconds each lower extremity for improved functional strength and balance.    Time 4    Period Weeks    Status New      PT SHORT TERM GOAL #4   Title Pt will improve DGI score to at least 16/24 for decreased fall risk.    Time 4    Period Weeks    Status New      PT SHORT TERM GOAL #5   Title Pt will verbalize understanding of fall prevention education for home environment.    Time 4    Period Weeks    Status New             PT Long Term Goals - 03/25/20 1605      PT LONG TERM GOAL #1   Title Pt will be independent with HEP for improved strength, balance, transfers, and gait.  TARGET 05/06/2020    Time 6    Period Weeks    Status New      PT LONG TERM GOAL #2   Title Pt will improve gait velocity to at least 3 ft/sec for improved gait efficiency and safety in community.    Time 6    Period Weeks    Status New      PT LONG TERM GOAL #3   Title Pt will improve DGI score to at least 19/24 for decreased fall risk.    Time 6    Period Weeks    Status New      PT LONG TERM GOAL #4   Title Pt will ambulate at least 1000 ft, indoor and outdoor surfaces, no device, independently, for safe return to independent community giat.    Time 6    Period Weeks    Status New      PT LONG TERM GOAL #5   Title Pt will report improvement in FOTO score at d/c by at least 20% for improved overall functional mobility.    Baseline 47.37%    Time 6    Period Weeks    Status New                 Plan - 04/13/20 1403    Clinical Impression Statement Today's skilled session continued to focus on gait on outdoor surfaces with no AD, including on steep grassy hills and on  balance reactions on compliant surfaces. No issues reported or noted in session. Pt continues to be challenged by compliant surfaces when UE support removed.  The pt is progressing toward goals and should benefit from continued PT to progress toward unmet goals.    Personal Factors and Comorbidities Other;Comorbidity 1   HTN (BP elevated during PT session; almost to parameter unable to treat)   Comorbidities Hx of depression    Examination-Activity Limitations Locomotion Level;Stairs;Stand    Examination-Participation Restrictions Occupation;Community Activity    Stability/Clinical Decision Making Stable/Uncomplicated    Rehab Potential Good    PT Frequency 2x / week    PT Duration 6 weeks   plus eval   PT Treatment/Interventions ADLs/Self Care Home Management;DME Instruction;Gait training;Stair training;Functional mobility training;Therapeutic activities;Therapeutic exercise;Balance training;Neuromuscular re-education;Patient/family education;Vestibular    PT Next Visit Plan Continue gait training with SPC. Continue dynamic balance with SLS activities, coordination activities,Balance on compliant surfaces,  functional strengthening.    Consulted and Agree with Plan of Care Patient           Patient will benefit from skilled therapeutic intervention in order to improve the following deficits and impairments:  Abnormal gait, Difficulty walking, Dizziness, Decreased balance, Decreased mobility, Decreased strength  Visit Diagnosis: Muscle weakness (generalized)  Unsteadiness on feet  Other abnormalities of gait and mobility     Problem List Patient Active Problem List   Diagnosis Date Noted  . 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 Depression 03/15/2020    Sallyanne Kuster, PTA, [facility] Outpatient Neuro [facility] [address], [address] [place], [place] [address] [phone] 04/14/20, 1:05 PM   Name: CHEY CHO MRN: [id number] Date of Birth: 05-Oct-1990

## 2020-04-15 NOTE — Therapy (Signed)
[facility] [address] [place], [place], [address] Phone: [phone]   Fax:  [phone]  Speech Language Pathology Evaluation  Patient Details  Name: Jackson Terrell MRN: [id number] Date of Birth: 08/29/1990 Referring Provider (SLP): Ihor Austin NP   Encounter Date: 04/13/2020    Past Medical History:  Diagnosis Date   Scoliosis    mild    Past Surgical History:  Procedure Laterality Date   BUBBLE STUDY  03/18/2020   Procedure: BUBBLE STUDY;  Surgeon: Wendall Stade, MD;  Location: [facility] ENDOSCOPY;  Service: Cardiovascular;;   TEE WITHOUT CARDIOVERSION N/A 03/18/2020   Procedure: TRANSESOPHAGEAL ECHOCARDIOGRAM (TEE);  Surgeon: Wendall Stade, MD;  Location: [facility] ENDOSCOPY;  Service: Cardiovascular;  Laterality: N/A;    There were no vitals filed for this visit.       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 Upon work-up for serotonin syndrome, MRI revealed R cerebellum infarct   Mobility Status Has cane, did not use it to walk from lobby to ST room, on PT caseload      Balance Screen   Has the patient fallen in the past 6 months No    Has the patient had a decrease in activity level because of a fear of falling?  No    Is the patient reluctant to leave their home because of a fear of falling?  No      Prior Functional Status   Cognitive/Linguistic Baseline Within functional limits    Type of Home Apartment     Lives With Alone    Available Support Family      Cognition   Overall Cognitive Status Within Functional Limits for tasks  assessed      Auditory Comprehension   Overall Auditory Comprehension Appears within functional limits for tasks assessed      Visual Recognition/Discrimination   Discrimination Not tested      Reading Comprehension   Reading Status Within funtional limits      Expression   Primary Mode of Expression Verbal      Verbal Expression   Overall Verbal Expression Appears within functional limits for tasks assessed      Written Expression   Dominant Hand Right      Oral Motor/Sensory Function   Overall Oral Motor/Sensory Function Appears within functional limits for tasks assessed    Labial ROM Within Functional Limits      Motor Speech   Overall Motor Speech Impaired    Respiration Within functional limits    Phonation Normal    Resonance Within functional limits    Articulation Impaired    Level of Impairment Conversation    Intelligibility Intelligibility reduced    Word 75-100% accurate    Phrase 75-100% accurate    Sentence 75-100% accurate    Conversation 75-100% accurate    Motor Planning Witnin functional limits    Motor Speech Errors Consistent  SLP Long Term Goals - 04/15/20 1411      SLP LONG TERM GOAL #1   Title Pt will be independent with HEP for dysarthira    Time 6    Period Weeks    Status New      SLP LONG TERM GOAL #2   Title Pt will be 100% intelligble in noisy environment and while walking outside of clinic with rare min A over 2 sessions    Time 6    Period Weeks    Status New      SLP LONG TERM GOAL #3   Title Pt will use compensatory strategies to be 100% intelligible on phone calls with family and business with rare min A    Time 6    Period Weeks    Status New      SLP LONG TERM GOAL #4   Title Pt wil limprove score on Communicative Effectiveness Survey by 3 points (original score is 19)    Time 6    Period Weeks    Status New            Plan - 04/15/20 1402    Clinical  Impression Statement Jackson Terrell is referred for outpt ST due to slurred speech followng a cerebellar CVA. 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 Jackson Terrell does not want to talk to clients or vendors due to slurred speech. Jackson Terrell reports fatigue of speech musculature after talking for longer periods, as well as increased slur with fatigue. 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 Jackson Terrell is intelligble with slow rate, however upon increasing his rate of speech, intelligilbity reduces to 95% Terrell to Terrell in a quiet room. The Communicative Effectiveness Survey revealed a score of 19. His lowest ratings were conversing with familiare person and stranger over the phone, Being part of a conversation in a noisy environment, speaking when emotionally upset, and having a conversation with someone at a distance. Rapid alternating speech tasks had irregular rate with some short rushes of speech and imprecise consonants, especially with velar stops. Fricatives, affricates and consonant clusers are also imprecise. Jackson Terrell is aware to reduce rate to improve intelligibility. 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 I initiated HEP for dysarthria with Jackson Terrell today. I am requesting certification for 6 weeks, however I believe Jackson Terrell will likely be discharged sooner.    Speech Therapy Frequency 2x / week    Duration --   6 weeks or 13 visits   Treatment/Interventions SLP instruction and feedback;Compensatory strategies;Functional tasks;Language facilitation;Patient/family education;Compensatory techniques;Cueing hierarchy;Environmental controls;Internal/external aids    Potential to Achieve Goals Good    SLP Home Exercise Plan HEP for dysarthria - see pt instructions    Consulted and Agree with Plan of Care Patient           Patient will benefit from skilled therapeutic  intervention in order to improve the following deficits and impairments:   Dysarthria and anarthria    Problem List Patient Active Problem List   Diagnosis Date Noted   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

## 2020-04-19 ENCOUNTER — Telehealth: Payer: Self-pay | Admitting: Speech Pathology

## 2020-04-19 ENCOUNTER — Other Ambulatory Visit: Payer: Self-pay

## 2020-04-19 ENCOUNTER — Ambulatory Visit: Payer: BC Managed Care – PPO | Admitting: Occupational Therapy

## 2020-04-19 ENCOUNTER — Ambulatory Visit: Payer: BC Managed Care – PPO | Admitting: Speech Pathology

## 2020-04-19 ENCOUNTER — Ambulatory Visit: Payer: BC Managed Care – PPO | Attending: Internal Medicine

## 2020-04-19 ENCOUNTER — Encounter: Payer: Self-pay | Admitting: Occupational Therapy

## 2020-04-19 DIAGNOSIS — R2681 Unsteadiness on feet: Secondary | ICD-10-CM | POA: Diagnosis present

## 2020-04-19 DIAGNOSIS — R42 Dizziness and giddiness: Secondary | ICD-10-CM | POA: Insufficient documentation

## 2020-04-19 DIAGNOSIS — M6281 Muscle weakness (generalized): Secondary | ICD-10-CM | POA: Insufficient documentation

## 2020-04-19 DIAGNOSIS — R29818 Other symptoms and signs involving the nervous system: Secondary | ICD-10-CM | POA: Diagnosis present

## 2020-04-19 DIAGNOSIS — R278 Other lack of coordination: Secondary | ICD-10-CM | POA: Diagnosis present

## 2020-04-19 DIAGNOSIS — R471 Dysarthria and anarthria: Secondary | ICD-10-CM | POA: Diagnosis present

## 2020-04-19 DIAGNOSIS — R2689 Other abnormalities of gait and mobility: Secondary | ICD-10-CM | POA: Diagnosis present

## 2020-04-19 NOTE — Therapy (Signed)
[facility] [address] [place], [place], [address] Phone: [phone]   Fax:  [phone]  Physical Therapy Treatment  Patient Details  Name: Jackson Terrell MRN: [id number] Date of Birth: Sep 29, 1990 Referring Provider (PT): Dr. Sharolyn Douglas (hospitalist) Ihor Austin, NP; Aura Dials, MD (PCP))   Encounter Date: 04/19/2020   PT End of Session - 04/19/20 2119    Visit Number 6    Number of Visits 13    Authorization Type BCBS    PT Start Time 1400    PT Stop Time 1445    PT Time Calculation (min) 45 min    Equipment Utilized During Treatment Gait belt    Activity Tolerance Patient tolerated treatment well    Behavior During Therapy [facility] for tasks assessed/performed           Past Medical History:  Diagnosis Date  . Scoliosis    mild    Past Surgical History:  Procedure Laterality Date  . BUBBLE STUDY  03/18/2020   Procedure: BUBBLE STUDY;  Surgeon: Wendall Stade, MD;  Location: [facility] ENDOSCOPY;  Service: Cardiovascular;;  . TEE WITHOUT CARDIOVERSION N/A 03/18/2020   Procedure: TRANSESOPHAGEAL ECHOCARDIOGRAM (TEE);  Surgeon: Wendall Stade, MD;  Location: [facility] ENDOSCOPY;  Service: Cardiovascular;  Laterality: N/A;    There were no vitals filed for this visit.   Subjective Assessment - 04/19/20 2117    Subjective No new complaints. He is walking 1 mile per day. He takes cane with him but doesn't use it on walk. He feels that when not using cane, he notices trouble keeping striaght line.    Patient is accompained by: Family member   mom in car   Patient Stated Goals Pt's goals for therapy are to improve walking straight and get R arm and hand work better.                Gait training: - walking fwd and bwd with eyes closed: 3 x 20 feet  - walking 2 x 100 feet with cues to swing arms - walking tandem fwd and bwd: 2 x 20 feet - walking 115' with 20 lb KB in R hand and 115' with 20lb KB in L hand -  Walking fwd and bwd on grass: 100'  Neuro re-ed SLS with ball throw: 20 throws each leg Tandem stance with ball throw: 20 throws each leg in front Tandem stance on 1/2 foam roll (flat side up) 30 sec each leg 1/2 foam roll wide stance balance: flat side up: 30 sec 1/2 foam roll squats: 10x Working on stepping strategy: pt standing with narrow BOS in grass: PT provides manual perturbations in multiple directions Lateral step ups: cross legged: 6" box: 10x R and L Unilateral heel raises: 20x R and L                          PT Short Term Goals - 03/25/20 1603      PT SHORT TERM GOAL #1   Title Pt will be independent with HEP for improved strength, balance, transfers, and gait.  TARGET 04/22/2020    Time 4    Period Weeks    Status New      PT SHORT TERM GOAL #2   Title Pt will improve TUG score to less than or equal to 13.5 sec with no device, for decreased fall risk.    Time 4    Period Weeks  Status New      PT SHORT TERM GOAL #3   Title Pt will improve SLS to at least 3 seconds each lower extremity for improved functional strength and balance.    Time 4    Period Weeks    Status New      PT SHORT TERM GOAL #4   Title Pt will improve DGI score to at least 16/24 for decreased fall risk.    Time 4    Period Weeks    Status New      PT SHORT TERM GOAL #5   Title Pt will verbalize understanding of fall prevention education for home environment.    Time 4    Period Weeks    Status New             PT Long Term Goals - 03/25/20 1605      PT LONG TERM GOAL #1   Title Pt will be independent with HEP for improved strength, balance, transfers, and gait.  TARGET 05/06/2020    Time 6    Period Weeks    Status New      PT LONG TERM GOAL #2   Title Pt will improve gait velocity to at least 3 ft/sec for improved gait efficiency and safety in community.    Time 6    Period Weeks    Status New      PT LONG TERM GOAL #3   Title Pt will improve DGI  score to at least 19/24 for decreased fall risk.    Time 6    Period Weeks    Status New      PT LONG TERM GOAL #4   Title Pt will ambulate at least 1000 ft, indoor and outdoor surfaces, no device, independently, for safe return to independent community giat.    Time 6    Period Weeks    Status New      PT LONG TERM GOAL #5   Title Pt will report improvement in FOTO score at d/c by at least 20% for improved overall functional mobility.    Baseline 47.37%    Time 6    Period Weeks    Status New                 Plan - 04/19/20 2118    Clinical Impression Statement Today's session was focused on progressing patient's dynamic balance and gait. Pt demonstrated increasing difficulty when balance and gait was challanged with eyes closed. Patient also demonstrated decreased single leg balance with dynamic activities of ball throws.    Personal Factors and Comorbidities Other;Comorbidity 1   HTN (BP elevated during PT session; almost to parameter unable to treat)   Comorbidities Hx of depression    Examination-Activity Limitations Locomotion Level;Stairs;Stand    Examination-Participation Restrictions Occupation;Community Activity    Stability/Clinical Decision Making Stable/Uncomplicated    Rehab Potential Good    PT Frequency 2x / week    PT Duration 6 weeks   plus eval   PT Treatment/Interventions ADLs/Self Care Home Management;DME Instruction;Gait training;Stair training;Functional mobility training;Therapeutic activities;Therapeutic exercise;Balance training;Neuromuscular re-education;Patient/family education;Vestibular    PT Next Visit Plan Continue gait training with SPC. Continue dynamic balance with SLS activities, coordination activities,Balance on compliant surfaces and Eyes closed,  functional strengthening.    Consulted and Agree with Plan of Care Patient           Patient will benefit from skilled therapeutic intervention in order to improve the following deficits  and impairments:  Abnormal gait, Difficulty walking, Dizziness, Decreased balance, Decreased mobility, Decreased strength  Visit Diagnosis: Muscle weakness (generalized)  Other symptoms and signs involving the nervous system  Unsteadiness on feet  Other abnormalities of gait and mobility  Dizziness and giddiness     Problem List Patient Active Problem List   Diagnosis Date Noted  . 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 Depression 03/15/2020    Ileana Ladd, PT 04/19/2020, 9:26 PM  [facility] [address] [place], [place], [address] Phone: [phone]   Fax:  [phone]  Name: Jackson Terrell MRN: [id number] Date of Birth: [birth year]

## 2020-04-19 NOTE — Telephone Encounter (Signed)
After no-show for 1:15 speech appointment, called to remind pt of PT and OT appointments at 2:00 and 2:45. No answer; left message. Also informed of next speech therapy appointment on 10/7 at 1:15.  Rondel Baton, MS, Sports administrator

## 2020-04-19 NOTE — Therapy (Signed)
[facility] [address] [place], [place], [address] Phone: [phone]   Fax:  [phone]  Occupational Therapy Treatment  Patient Details  Name: Jackson Terrell MRN: [id number] Date of Birth: 21-Aug-1990 Referring Provider (OT): Ihor Austin, NP ((pt was referred by hospitalist))   Encounter Date: 04/19/2020   OT End of Session - 04/19/20 1531    Visit Number 5    Number of Visits 17    Date for OT Re-Evaluation 05/26/20    Authorization Type BCBS    OT Start Time 1445    OT Stop Time 1528    OT Time Calculation (min) 43 min    Activity Tolerance Patient tolerated treatment well    Behavior During Therapy [facility] for tasks assessed/performed           Past Medical History:  Diagnosis Date  . Scoliosis    mild    Past Surgical History:  Procedure Laterality Date  . BUBBLE STUDY  03/18/2020   Procedure: BUBBLE STUDY;  Surgeon: Wendall Stade, MD;  Location: [facility] ENDOSCOPY;  Service: Cardiovascular;;  . TEE WITHOUT CARDIOVERSION N/A 03/18/2020   Procedure: TRANSESOPHAGEAL ECHOCARDIOGRAM (TEE);  Surgeon: Wendall Stade, MD;  Location: [facility] ENDOSCOPY;  Service: Cardiovascular;  Laterality: N/A;    There were no vitals filed for this visit.   Subjective Assessment - 04/19/20 1503    Subjective  I didn't do much of anything.  Played games    Patient Stated Goals improve RUE dexterity so he get back to working from home    Currently in Pain? No/denies    Multiple Pain Sites No                        OT Treatments/Exercises (OP) - 04/19/20 0001      ADLs   Cooking Worked on simple cooking task - prepared rice dish.  Patient able to follow written instructions and follow while holding conversation.      Writing Working on Chiropractor - patient with improving legibility, although handwriting is deliberate - pen oriented more horizontally than upright.  Tried built up grip without success.       Driving Patient has been driving with his mother in the car without incident - per patient.  Patient indicates dizziness and visual disturbance has nealry completely resolved.  No coordiantion issues noted todat that would interfere with driving.                      OT Short Term Goals - 04/19/20 1533      OT SHORT TERM GOAL #1   Title I with HEP-04/29/20    Time 4    Period Weeks    Status On-going    Target Date 04/29/20      OT SHORT TERM GOAL #2   Title Pt will type 30 wpm with at least 85% accuracy.    Time 4    Period Weeks    Status Achieved      OT SHORT TERM GOAL #3   Title Pt will verbalize understanding of adapted strategies for ADLS/IADLS to maximize safety and I prn.    Time 4    Period Weeks    Status Achieved      OT SHORT TERM GOAL #4   Title Pt will demonstrate improved RUE fine motor coordination for ADLs as evidenced by decreasing 9 hole peg test score by 4 secs.  Baseline RUE 43.84 secs. LUE 24.03 secs    Time 4    Period Weeks    Status Achieved      OT SHORT TERM GOAL #5   Title Pt will  demonstrate improved UE functional use for ADLs as evidenced by increasing box/ blocks score by 4 blocks with RUE    Baseline RUE 40, LUE 55    Time 4    Status Achieved             OT Long Term Goals - 04/19/20 1533      OT LONG TERM GOAL #1   Title I with updated HEP.-05/27/20    Time 8    Period Weeks    Status On-going      OT LONG TERM GOAL #2   Title Pt will write a short paragraph with 100% legibility and only min decrease in letter size.    Time 8    Period Weeks    Status Achieved      OT LONG TERM GOAL #3   Title Pt will demonstrate improved fine motor coordination for ADLs as evidenced by decreasing 9 hole peg test score for RUE by 8 secs from eval    Baseline RUE 43.84 secs. LUE 24.03 secs    Time 8    Period Weeks    Status On-going      OT LONG TERM GOAL #4   Title Pt will  demonstrate improved RUE functional use for  ADLs as evidenced by increasing box/ blocks score by 8 blocks    Baseline RUE 40 blocks, LUE 55    Time 8    Period Weeks    Status Achieved      OT LONG TERM GOAL #5   Title Pt will type 35 wpm with 90% or better accuracy in prep for return to work.    Time 8    Period Weeks    Status On-going      OT LONG TERM GOAL #6   Title Pt will resume prior level of home management / cooking at a modified I level demonstrating good safety awareness.    Time 8    Period Weeks    Status On-going                 Plan - 04/19/20 1532    Clinical Impression Statement Patient is showing excellent progress toward goals.  Patient is driving with his mom in the car - and reports no difficulty.    OT Occupational Profile and History Detailed Assessment- Review of Records and additional review of physical, cognitive, psychosocial history related to current functional performance    Occupational performance deficits (Please refer to evaluation for details): ADL's;IADL's;Work;Social Participation;Leisure    Body Structure / Function / Physical Skills ADL;UE functional use;Balance;Flexibility;Pain;FMC;ROM;Gait;GMC;Coordination;Decreased knowledge of precautions;Decreased knowledge of use of DME;IADL;Dexterity;Mobility;Tone;Strength    Rehab Potential Good    Clinical Decision Making Limited treatment options, no task modification necessary    Comorbidities Affecting Occupational Performance: May have comorbidities impacting occupational performance    Modification or Assistance to Complete Evaluation  No modification of tasks or assist necessary to complete eval    OT Frequency 2x / week   or 16 visits total plus eval   OT Duration 8 weeks    OT Treatment/Interventions Self-care/ADL training;Ultrasound;DME and/or AE instruction;Patient/family education;Paraffin;Passive range of motion;Balance training;Cryotherapy;Fluidtherapy;Splinting;Moist Heat;Therapeutic exercise;Manual Therapy;Therapeutic  activities;Neuromuscular education    Plan RUE coordination with alternating attention, check homework organizing your day  Consulted and Agree with Plan of Care Patient;Family member/caregiver           Patient will benefit from skilled therapeutic intervention in order to improve the following deficits and impairments:   Body Structure / Function / Physical Skills: ADL, UE functional use, Balance, Flexibility, Pain, FMC, ROM, Gait, GMC, Coordination, Decreased knowledge of precautions, Decreased knowledge of use of DME, IADL, Dexterity, Mobility, Tone, Strength       Visit Diagnosis: Muscle weakness (generalized)  Other symptoms and signs involving the nervous system  Unsteadiness on feet    Problem List Patient Active Problem List   Diagnosis Date Noted  . 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 Depression 03/15/2020    Collier Salina, OTR/L 04/19/2020, 3:34 PM  [facility] [address] [place], [place], [address] Phone: [phone]   Fax:  [phone]  Name: Jackson Terrell MRN: [id number] Date of Birth: Aug 23, 1990

## 2020-04-20 ENCOUNTER — Encounter: Payer: Self-pay | Admitting: Adult Health

## 2020-04-20 ENCOUNTER — Ambulatory Visit (INDEPENDENT_AMBULATORY_CARE_PROVIDER_SITE_OTHER): Payer: BC Managed Care – PPO | Admitting: Adult Health

## 2020-04-20 VITALS — BP 146/98 | HR 87 | Ht 68.0 in | Wt 227.0 lb

## 2020-04-20 DIAGNOSIS — I639 Cerebral infarction, unspecified: Secondary | ICD-10-CM | POA: Diagnosis not present

## 2020-04-20 DIAGNOSIS — G2579 Other drug induced movement disorders: Secondary | ICD-10-CM

## 2020-04-20 DIAGNOSIS — I10 Essential (primary) hypertension: Secondary | ICD-10-CM | POA: Diagnosis not present

## 2020-04-20 DIAGNOSIS — E785 Hyperlipidemia, unspecified: Secondary | ICD-10-CM | POA: Diagnosis not present

## 2020-04-20 MED ORDER — ATORVASTATIN CALCIUM 80 MG PO TABS: 80.0000 mg | ORAL_TABLET | Freq: Every day | ORAL | 3 refills | Status: AC

## 2020-04-20 MED ORDER — ASPIRIN EC 81 MG PO TBEC: 81.0000 mg | DELAYED_RELEASE_TABLET | Freq: Every day | ORAL | 3 refills | Status: AC

## 2020-04-20 NOTE — Progress Notes (Signed)
I agree with the above plan 

## 2020-04-20 NOTE — Patient Instructions (Signed)
Continue to work with outpatient therapy for likely ongoing improvement   restart aspirin 81 mg daily  and atorvastatin  for secondary stroke prevention  Continue to follow up with PCP regarding cholesterol and blood pressure management  Maintain strict control of hypertension with blood pressure goal below 130/90 and cholesterol with LDL cholesterol (bad cholesterol) goal below 70 mg/dL.      Followup in the future with me in 3 months or call earlier if needed     Thank you for coming to see Jackson Terrell at [facility]. I hope we have been able to provide you high quality care today.  You may receive a patient satisfaction survey over the next few weeks. We would appreciate your feedback and comments so that we may continue to improve ourselves and the health of our patients.     Eating Plan After Stroke A stroke causes damage to the brain cells, which can affect your ability to walk, talk, and even eat. The impact of a stroke is different for everyone, and so is recovery. A good nutrition plan is important for your recovery. It can also lower your risk of another stroke. If you have difficulty chewing and swallowing your food, a dietitian or your stroke care team can help so that you can enjoy eating healthy foods. What are tips for following this plan?  Reading food labels  Choose foods that have less than 300 milligrams (mg) of sodium per serving. Limit your sodium intake to less than 1,500 mg per day.  Avoid foods that have saturated fat and trans fat.  Choose foods that are low in cholesterol. Limit the amount of cholesterol you eat each day to less than 200 mg.  Choose foods that are high in fiber. Eat 20-30 grams (g) of fiber each day.  Avoid foods with added sugar. Check the food label for ingredients such as sugar, corn syrup, honey, fructose, molasses, and cane juice. Shopping  At the grocery store, buy most of your food from areas near the walls of the  store. This includes: ? Fresh fruits and vegetables. ? Dry grains, beans, nuts, and seeds. ? Fresh seafood, poultry, lean meats, and eggs. ? Low-fat dairy products.  Buy whole ingredients instead of prepackaged foods.  Buy fresh, in-season fruits and vegetables from local farmers markets.  Buy frozen fruits and vegetables in resealable bags. Cooking  Prepare foods with very little salt. Use herbs or salt-free spices instead.  Cook with heart-healthy oils, such as olive, avocado, canola, soybean, or sunflower oil.  Avoid frying foods. Bake, grill, or broil foods instead.  Remove visible fat and skin from meat and poultry before eating.  Modify food textures as told by your health care provider. Meal planning  Eat a wide variety of colorful fruits and vegetables. Make sure one-half of your plate is filled with fruits and vegetables at each meal.  Eat fruits and vegetables that are high in potassium, such as: ? Apples, bananas, oranges, and melon. ? Sweet potatoes, spinach, zucchini, and tomatoes.  Eat fish that contain heart-healthy fats (omega-3 fats) at least twice a week. These include salmon, tuna, mackerel, and sardines.  Eat plant foods that are high in omega-3 fats, such as flaxseeds and walnuts. Add these to cereals, yogurt, or pasta dishes.  Eat several servings of high-fiber foods each day, such as fruits, vegetables, whole grains, and beans.  Do not put salt at the table for meals.  When eating out at restaurants: ? Ask the  server about low-salt or salt-free food options. ? Avoid fried foods. Look for menu items that are grilled, steamed, broiled, or roasted. ? Ask if your food can be prepared without butter. ? Ask for condiments, such as salad dressings, gravy, or sauces to be served on the side.  If you have difficulty swallowing: ? Choose foods that are softer and easier to chew and swallow. ? Cut foods into small pieces and chew well before  swallowing. ? Thicken liquids as told by your health care provider or dietitian. ? Let your health care provider know if your condition does not improve over time. You may need to work with a speech therapist to re-train the muscles that are used for eating. General recommendations  Involve your family and friends in your recovery, if possible. It may be helpful to have a slower meal time and to plan meals that include foods everyone in the family can eat.  Brush your teeth with fluoride toothpaste twice a day, and floss once a day. Keeping a clean mouth can help you swallow and can also help your appetite.  Drink enough water each day to keep your urine pale yellow. If needed, set reminders or ask your family to help you remember to drink water.  Limit alcohol intake to no more than 1 drink a day for nonpregnant women and 2 drinks a day for men. One drink equals 12 oz of beer, 5 oz of wine, or 1 oz of hard liquor. Summary  Following this eating plan can help in your stroke recovery and can decrease your risk for another stroke.  Let your health care provider know if you have problems with swallowing. You may need to work with a speech therapist. This information is not intended to replace advice given to you by your health care provider. Make sure you discuss any questions you have with your health care provider. Document Revised: 10/23/2018 Document Reviewed: 09/09/2017 Elsevier Patient Education  2020 [employer].

## 2020-04-20 NOTE — Progress Notes (Signed)
[facility] [address] [place]. [place] [address] [phone]       HOSPITAL FOLLOW UP NOTE  Mr. Jackson Terrell Date of Birth:  [birth year] Medical Record Number:  [id number]   Reason for Referral:  hospital stroke follow up    SUBJECTIVE:   CHIEF COMPLAINT:  Chief Complaint  Patient presents with  . Hospitalization Follow-up    rm 9  . Cerebrovascular Accident    pt is having no new sx.    HPI:   Mr. Jackson Terrell is a 29 y.o. male withPMH significant for scoliosis, and depression on sertralinewho presented on 03/15/2020 with diaphoresis, nausea/vomiting. He was noted to have dilated pupils, hyperreflexia and increased tone in the ED. Given his presentation, neurology was consulted to assess for potential serotonin syndrome. MRI showed right cerebellar infarct embolic, secondary to unknown source.  Possible serotonin syndrome from SSRI overmedication taking sertraline 300 mg daily.  TEE unremarkable without evidence of PFO or thrombus.  Hypercoagulable work-up unremarkable.  HTN stable.  LDL 96 and initiate atorvastatin 10 mg daily.  Controlled DM with A1c 5.4.  Other stroke risk factors include EtOH use but no prior stroke history.  Recommended DAPT for 3 weeks then aspirin alone.  Advised to follow-up outpatient with psychiatry for depression management.  Residual deficits of mild saccadic dysmetria on right lateral gaze, dysarthria and right-sided weakness and discharged home in stable condition recommendation of outpatient therapy.  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 Likely an acute infarct however cannot exclude demyelination. Consider postcontrast imaging for further evaluation.  MRA head /neck no abnormal enhancement. This is suggestive of acute infarct involving the superior right cerebellum and vermis;    2D Echo  EF 60 to  65%  Transcranial Doppler: Negative for PFO  TEE: Normal   hypercoagulable studies: neg  LDL 96  HgbA1c 5.4  VTE prophylaxis - none  No antithrombotic prior to admission, now on aspirin 325 mg daily.   Therapy recommendations:  OP PT/OT/SLP  Disposition:  home  Today, 04/20/2020, Mr. [name] is being seen for hospital follow up accompanied by his mother.  Residual deficits of decreased right hand dexterity and dysarthria but does report continued improvement.  Denies residual balance, dizziness or visual issues.  Currently using a cane intermittently as he has some gait difficulty with increased fatigue or with increased exertion but denies any recent falls.  Currently working with outpatient PT/OT/SLP.  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  He requests clearance to drive independently as his mother has been accompanying him while he is driving.  Completed 3 weeks DAPT and check aspirin for additional 1 week and then discontinued as he was not aware this is an ongoing medication.  Denies any issues with bleeding or bruising.  Completed 30 days of atorvastatin and discontinued, again, he was unaware of ongoing use.  Denies any myalgias or statin side effects.  Blood pressure today 146/98.  He does not routinely monitor at home.  No further concerns.     ROS:   14 system review of systems performed and negative with exception of those listed in HPI  PMH:  Past Medical History:  Diagnosis Date  . Scoliosis    mild    PSH:  Past Surgical History:  Procedure Laterality Date  . BUBBLE STUDY  03/18/2020   Procedure: BUBBLE STUDY;  Surgeon: Wendall Stade, MD;  Location: [facility] ENDOSCOPY;  Service: Cardiovascular;;  . TEE WITHOUT CARDIOVERSION N/A 03/18/2020   Procedure: TRANSESOPHAGEAL ECHOCARDIOGRAM (TEE);  Surgeon: Wendall Stade, MD;  Location: [facility] ENDOSCOPY;  Service: Cardiovascular;  Laterality: N/A;    Social History:  Social  History   Socioeconomic History  . Marital status: Single    Spouse name: Not on file  . Number of children: Not on file  . Years of education: Not on file  . Highest education level: Not on file  Occupational History  . Occupation: accounts payable (Therapist, sports)    Employer: [employer]  Tobacco Use  . Smoking status: Former Smoker    Types: Cigarettes    Quit date: 07/17/2007    Years since quitting: 12.7  . Smokeless tobacco: Never Used  Substance and Sexual Activity  . Alcohol use: Yes    Comment: 3 beers/week or less  . Drug use: No  . Sexual activity: Not on file  Other Topics Concern  . Not on file  Social History Narrative   Lives alone, no pets   Social Determinants of Health   Financial Resource Strain:   . Difficulty of Paying Living Expenses: Not on file  Food Insecurity:   . Worried About Programme researcher, broadcasting/film/video in the Last Year: Not on file  . Ran Out of Food in the Last Year: Not on file  Transportation Needs: No Transportation Needs  . Lack of Transportation (Medical): No  . Lack of Transportation (Non-Medical): No  Physical Activity:   . Days of Exercise per Week: Not on file  . Minutes of Exercise per Session: Not on file  Stress:   . Feeling of Stress : Not on file  Social Connections:   . Frequency of Communication with Friends and Family: Not on file  . Frequency of Social Gatherings with Friends and Family: Not on file  . Attends Religious Services: Not on file  . Active Member of Clubs or Organizations: Not on file  . Attends Banker Meetings: Not on file  . Marital Status: Not on file  Intimate Partner Violence:   . Fear of Current or Ex-Partner: Not on file  . Emotionally Abused: Not on file  . Physically Abused: Not on file  . Sexually Abused: Not on file    Family History:  Family History  Problem Relation Age of Onset  . Hemachromatosis Father   . Cancer Neg Hx   . Diabetes Neg Hx     Medications:    Current Outpatient Medications on File Prior to Visit  Medication Sig Dispense Refill  . sertraline (ZOLOFT) 100 MG tablet Take 1 tablet (100 mg total) by mouth daily. 30 tablet 0   No current facility-administered medications on file prior to visit.    Allergies:  No Known Allergies    OBJECTIVE:  Physical Exam  Vitals:   04/20/20 1048  BP: (!) 146/98  Pulse: 87  Weight: 227 lb (103 kg)  Height: 5\' 8"  (1.727 m)   Body mass index is 34.52 kg/m. No exam data present  General: well developed, well nourished, pleasant middle-age Caucasian male, seated, in no evident distress Head: head normocephalic and atraumatic.   Neck: supple with no carotid or supraclavicular bruits Cardiovascular: regular rate and rhythm, no murmurs Musculoskeletal: no deformity Skin:  no rash/petichiae Vascular:  Normal pulses all extremities   Neurologic Exam Mental Status: Awake and fully alert.   Mild dysarthria.  No evidence  of aphasia.  Oriented to place and time. Recent and remote memory intact. Attention span, concentration and fund of knowledge appropriate. Mood and affect appropriate.  Cranial Nerves: Fundoscopic exam reveals sharp disc margins. Pupils equal, briskly reactive to light. Extraocular movements full without nystagmus. Visual fields full to confrontation. Hearing intact. Facial sensation intact.  Right lower facial weakness.  Tongue and palate moves normally and symmetrically.  Motor: Normal bulk and tone. Normal strength in all tested extremity muscles except slightly decreased right hand dexterity and grip strength. Sensory.: intact to touch , pinprick , position and vibratory sensation.  Coordination: Rapid alternating movements normal in all extremities except slightly decreased right hand. Finger-to-nose RUE ataxia and heel-to-shin performed accurately Gait and Station: Arises from chair without difficulty. Stance is normal. Gait demonstrates normal stride length and balance  without use of assistive device.  Able to tandem walk and heel and toe walk without difficulty.  Romberg negative. Reflexes: 1+ and symmetric. Toes downgoing.     NIHSS  2 Modified Rankin  2     ASSESSMENT: Jackson Terrell is a 29 y.o. year old male presented with diaphoresis and nausea/vomiting on 03/15/2020 found to have dilated pupils, hyperreflexia and increased tone in ED.  Consulted neurology with concern of serotonin syndrome secondary to increased dose of sertraline 100 mg daily.  MRI showed evidence of right cerebellum and vermis infarct, embolic secondary source with negative TEE and hypercoagulable work-up.  No history of prolonged travel, history of DVT/PE, strokes or heart attacks and family at a young age.  Vascular risk factors include HLD.      PLAN:  1. R cerebellum and vermis stroke:  a. Residual deficit: RUE ataxia and decreased dexterity, right facial weakness, occasional gait impairment and dysarthria.  Continue to work with outpatient therapy for likely ongoing improvement.  He was cleared to return to driving independently but did advise him to avoid major roads or highways until he becomes more comfortable. b. Advised to restart aspirin 81 mg daily  and atorvastatin 80 mg daily for secondary stroke prevention.  Discussed lifelong use of these medications for secondary stroke prevention. c. Discussed secondary stroke prevention measures and importance of close PCP follow up for aggressive stroke risk factor management  2. HTN: BP goal <130/90.  Slightly elevated today.  Recommend monitoring at home and to follow-up with PCP if remains uncontrolled for possible need of further management 3. HLD: LDL goal <70. Recent LDL 96.  Restart atorvastatin 80 mg daily.  Refill provided and advised ongoing refills will need to be obtained by PCP 4. Serotonin syndrome: Secondary to high-dose of sertraline at 300 mg daily.  No reoccurring symptoms.  Currently on sertraline 100 mg  daily and feels as though his depression and anxiety stable.  Currently managed by PCP.    Follow up in 3 months or call earlier if needed   I spent 45 minutes of face-to-face and non-face-to-face time with patient and his mother.  This included previsit chart review, lab review, study review, order entry, electronic health record documentation, patient education regarding recent stroke, residual deficits, importance of managing stroke risk factors and answered all questions to patient satisfaction   Ihor Austin, [facility]  [facility] [address] [place], [place] [address]  Phone [phone] Fax [phone] Note: This document was prepared with digital dictation and possible smart phrase technology. Any transcriptional errors that result from this process are unintentional.

## 2020-04-21 ENCOUNTER — Ambulatory Visit: Payer: BC Managed Care – PPO | Admitting: Occupational Therapy

## 2020-04-21 ENCOUNTER — Ambulatory Visit: Payer: BC Managed Care – PPO

## 2020-04-21 ENCOUNTER — Other Ambulatory Visit: Payer: Self-pay | Admitting: *Deleted

## 2020-04-21 ENCOUNTER — Ambulatory Visit: Payer: BC Managed Care – PPO | Admitting: Speech Pathology

## 2020-04-21 ENCOUNTER — Encounter: Payer: Self-pay | Admitting: Occupational Therapy

## 2020-04-21 ENCOUNTER — Other Ambulatory Visit: Payer: Self-pay

## 2020-04-21 VITALS — BP 130/94

## 2020-04-21 VITALS — BP 152/88

## 2020-04-21 DIAGNOSIS — R29818 Other symptoms and signs involving the nervous system: Secondary | ICD-10-CM

## 2020-04-21 DIAGNOSIS — R2689 Other abnormalities of gait and mobility: Secondary | ICD-10-CM

## 2020-04-21 DIAGNOSIS — M6281 Muscle weakness (generalized): Secondary | ICD-10-CM | POA: Diagnosis not present

## 2020-04-21 DIAGNOSIS — R2681 Unsteadiness on feet: Secondary | ICD-10-CM

## 2020-04-21 DIAGNOSIS — R471 Dysarthria and anarthria: Secondary | ICD-10-CM

## 2020-04-21 DIAGNOSIS — R278 Other lack of coordination: Secondary | ICD-10-CM

## 2020-04-21 NOTE — Patient Instructions (Signed)
Add some more work-related topics to the list. Practice these SLOWLY like you do with the tongue twisters. Think about what you might say on the phone to clients, what you might say in meetings, etc.   This is [place].  Thanks for calling [employer]. This is [employer]. How can I help you? I had a stroke. Sometimes it affects my speech. Please be patient with me. Let me know if you don't understand me. Preleasing numbers Student housing Third party management You'll need to code this to 7415. Other professional fees below the line Operating income Normal operating expenses Unexpected fees One time fee

## 2020-04-21 NOTE — Patient Outreach (Signed)
[facility]) Care Management  [facility] Care Manager  04/21/2020   Jackson Terrell 07-14-1991 [id number]  Subjective: Telephone call to patient's home / mobile number, spoke with patient, and HIPAA verified.  Patient states he is doing good, working on speech, continues to be slurred periodically, walking better, continues to work with outpatient therapy, and all therapy is going well.  States he had a follow up appointment with neurologist on 04/20/2020, appointment went well, and has received approval to resume driving. Patient states he has a follow up appointment with primary MD on 04/27/2020. States he received the Advanced Directives packet, [facility] Care Management information, and has no questions.  Patient states he is planning to follow up with psychiatrist at a later time, he is aware [facility] Care Management Social Worker behavorial health counseling / community resource services,  and will assess at a later time if needed.  Patient states he does not have any education material, EMMI follow up, care coordination, care management, disease monitoring, transportation, community resource, or pharmacy needs at this time.   States he is aware that 30 day EMMI automated call follow up is completed and has no care management needs. RNCM advised patient, moving to another position after 04/28/2020, patient voices understanding, states he has this RNCM's and [facility] Care Management contact information, will call if assistance needed in the future.  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     Objective: Per KPN (Knowledge Performance Now, point of care tool) and chart review,patient hospitalized 03/15/2020 - 03/19/2020 forSerotonin syndrome,Cerebral embolism with cerebral infarction. Patient also has a history of depression.    Encounter Medications:  Outpatient Encounter Medications as of 04/21/2020   Medication Sig  . aspirin EC 81 MG tablet Take 1 tablet (81 mg total) by mouth daily. Swallow whole.  Marland Kitchen atorvastatin (LIPITOR) 80 MG tablet Take 1 tablet (80 mg total) by mouth daily.  . sertraline (ZOLOFT) 100 MG tablet Take 1 tablet (100 mg total) by mouth daily.   No facility-administered encounter medications on file as of 04/21/2020.    Functional Status:  In your present state of health, do you have any difficulty performing the following activities: 03/16/2020  Hearing? N  Vision? N  Difficulty concentrating or making decisions? N  Walking or climbing stairs? N  Dressing or bathing? N  Doing errands, shopping? N  Some recent data might be hidden    Fall/Depression Screening: Fall Risk  03/22/2020  Falls in the past year? 0  Number falls in past yr: 0  Injury with Fall? 0  Risk for fall due to : No Fall Risks  Follow up Falls evaluation completed   PHQ 2/9 Scores 03/22/2020  PHQ - 2 Score 0    Assessment: Received Self Pay EMMI Stroke Red Flag Alert follow up referral on 03/22/2020. Red Flag Alert Trigger, Day #1, patient answered no to the following question: Scheduled a follow-up appointment?EMMI follow up completed, no further care management needs, and will proceed with case closure.    Goals Addressed   None     Plan: RNCM will complete case closure due to follow up completed / no care management needs.  RNCM will send MD case closure letter.      [name] H. Gardiner Barefoot, BSN, CCM [facility] Care Management [facility] Telephonic CM Phone: [phone] Fax: [phone]

## 2020-04-21 NOTE — Therapy (Signed)
[place] [address] [place], [place], [address] Phone: [phone]   Fax:  [phone]  Physical Therapy Treatment  Patient Details  Name: BRUIN BOLGER MRN: [id number] Date of Birth: April 11, 1991 Referring Provider (PT): Dr. Horris Latino (hospitalist) Frann Rider, NP; Phineas Inches, MD (PCP))   Encounter Date: 04/21/2020   PT End of Session - 04/21/20 1400    Visit Number 7    Number of Visits 13    Authorization Type BCBS    PT Start Time 1400    PT Stop Time 1438    PT Time Calculation (min) 38 min    Equipment Utilized During Treatment Gait belt    Activity Tolerance Patient tolerated treatment well    Behavior During Therapy [facility] for tasks assessed/performed           Past Medical History:  Diagnosis Date  . Scoliosis    mild    Past Surgical History:  Procedure Laterality Date  . BUBBLE STUDY  03/18/2020   Procedure: BUBBLE STUDY;  Surgeon: Josue Hector, MD;  Location: [facility];  Service: Cardiovascular;;  . TEE WITHOUT CARDIOVERSION N/A 03/18/2020   Procedure: TRANSESOPHAGEAL ECHOCARDIOGRAM (TEE);  Surgeon: Josue Hector, MD;  Location: [facility] ENDOSCOPY;  Service: Cardiovascular;  Laterality: N/A;    Vitals:   04/21/20 1401  BP: (!) 152/88     Subjective Assessment - 04/21/20 1401    Subjective Pt reports that he feels he is doing better with moving arms and staying straighter. Developed swelling in left eye yesterday that he has had before. Reports that it is getting better.    Patient is accompained by: Family member   mom in car   Patient Stated Goals Pt's goals for therapy are to improve walking straight and get R arm and hand work better.    Currently in Pain? Yes    Pain Score 3     Pain Location Head    Pain Descriptors / Indicators Headache    Pain Type Acute pain    Pain Onset Today    Pain Frequency Intermittent                             [facility] Adult  PT Treatment/Exercise - 04/21/20 1423      Ambulation/Gait   Ambulation/Gait Yes    Ambulation/Gait Assistance 5: Supervision    Ambulation/Gait Assistance Details Pt was cued to not get too close to curb with gait and focus on large steps.    Ambulation Distance (Feet) 400 Feet    Assistive device None    Gait Pattern Step-through pattern    Ambulation Surface Level;Unlevel;Indoor;Outdoor      Self-Care   Self-Care Other Self-Care Comments    Other Self-Care Comments  PT recommended pt get automatic BP cuff to be able to monitor BP.      Neuro Re-ed    Neuro Re-ed Details  Dynamic gait in hallway: 40' x 4 with head turns with some noted staggering, 40' x 4 with looking up/down, tandem gait 40' x 2, gait with tossing 2.2# med ball hand to hand 40' x 2, Marching gait holding 2.2# ball in front with bringing knee up to ball each time to increase SLS time.  Close SBA/CGA with activities BP=172/100 after activities, Rested 5 min and down to 162/92.  PT Education - 04/21/20 1707    Education Details Pt instructed to try to monitor BP at home by getting home machine and to seek medical help if any symptoms arise.    Person(s) Educated Patient    Methods Explanation    Comprehension Verbalized understanding            PT Short Term Goals - 04/21/20 1711      PT SHORT TERM GOAL #1   Title Pt will be independent with HEP for improved strength, balance, transfers, and gait.  TARGET 04/22/2020    Baseline Pt has been performing initial HEP as instructed and walking daily. 04/21/20    Time 4    Period Weeks    Status Achieved      PT SHORT TERM GOAL #2   Title Pt will improve TUG score to less than or equal to 13.5 sec with no device, for decreased fall risk.    Time 4    Period Weeks    Status New      PT SHORT TERM GOAL #3   Title Pt will improve SLS to at least 3 seconds each lower extremity for improved functional strength and balance.    Time 4     Period Weeks    Status New      PT SHORT TERM GOAL #4   Title Pt will improve DGI score to at least 16/24 for decreased fall risk.    Time 4    Period Weeks    Status New      PT SHORT TERM GOAL #5   Title Pt will verbalize understanding of fall prevention education for home environment.    Baseline Pt is using cane when goes out of home for safety but not having to rely on it. He has been instructed to take his time with turns. No falls reported.    Time 4    Period Weeks    Status Achieved             PT Long Term Goals - 03/25/20 1605      PT LONG TERM GOAL #1   Title Pt will be independent with HEP for improved strength, balance, transfers, and gait.  TARGET 05/06/2020    Time 6    Period Weeks    Status New      PT LONG TERM GOAL #2   Title Pt will improve gait velocity to at least 3 ft/sec for improved gait efficiency and safety in community.    Time 6    Period Weeks    Status New      PT LONG TERM GOAL #3   Title Pt will improve DGI score to at least 19/24 for decreased fall risk.    Time 6    Period Weeks    Status New      PT LONG TERM GOAL #4   Title Pt will ambulate at least 1000 ft, indoor and outdoor surfaces, no device, independently, for safe return to independent community giat.    Time 6    Period Weeks    Status New      PT LONG TERM GOAL #5   Title Pt will report improvement in FOTO score at d/c by at least 20% for improved overall functional mobility.    Baseline 47.37%    Time 6    Period Weeks    Status New  Plan - 04/21/20 1713    Clinical Impression Statement PT met HEP and fall prevention STGs today. Deferred remaining goals as pt had elevated BP during session and stopped early due to this. I did encourage him to get an automatic BP machine to monitor at home. Not on anything for BP at this time.    Personal Factors and Comorbidities Other;Comorbidity 1   HTN (BP elevated during PT session; almost to parameter  unable to treat)   Comorbidities Hx of depression    Examination-Activity Limitations Locomotion Level;Stairs;Stand    Examination-Participation Restrictions Occupation;Community Activity    Stability/Clinical Decision Making Stable/Uncomplicated    Rehab Potential Good    PT Frequency 2x / week    PT Duration 6 weeks   plus eval   PT Treatment/Interventions ADLs/Self Care Home Management;DME Instruction;Gait training;Stair training;Functional mobility training;Therapeutic activities;Therapeutic exercise;Balance training;Neuromuscular re-education;Patient/family education;Vestibular    PT Next Visit Plan Check BP. Has pt been able to get a home monitor? Check remaining STGs. Continue dynamic balance with SLS activities, coordination activities,Balance on compliant surfaces and Eyes closed,  functional strengthening.    Consulted and Agree with Plan of Care Patient           Patient will benefit from skilled therapeutic intervention in order to improve the following deficits and impairments:  Abnormal gait, Difficulty walking, Dizziness, Decreased balance, Decreased mobility, Decreased strength  Visit Diagnosis: Other abnormalities of gait and mobility  Muscle weakness (generalized)     Problem List Patient Active Problem List   Diagnosis Date Noted  . 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 Depression 03/15/2020    Electa Sniff, PT, DPT, NCS 04/21/2020, 5:16 PM  [place] [address] [place], [place], [address] Phone: [phone]   Fax:  [phone]  Name: CLARA SMOLEN MRN: [id number] Date of Birth: May 10, 1991

## 2020-04-21 NOTE — Therapy (Signed)
[facility] [address] [place], [place], [address] Phone: [phone]   Fax:  [phone]  Speech Language Pathology Treatment  Patient Details  Name: Jackson Terrell MRN: [id number] Date of Birth: January 01, 1991 Referring Provider (SLP): Jackson Austin NP   Encounter Date: 04/21/2020   End of Session - 04/21/20 1332    Visit Number 2    Number of Visits 13    Date for SLP Re-Evaluation 05/25/20    SLP Start Time 1318    SLP Stop Time  1359    SLP Time Calculation (min) 41 min    Activity Tolerance Patient tolerated treatment well           Past Medical History:  Diagnosis Date  . Scoliosis    mild    Past Surgical History:  Procedure Laterality Date  . BUBBLE STUDY  03/18/2020   Procedure: BUBBLE STUDY;  Surgeon: Jackson Stade, MD;  Location: [facility] ENDOSCOPY;  Service: Cardiovascular;;  . TEE WITHOUT CARDIOVERSION N/A 03/18/2020   Procedure: TRANSESOPHAGEAL ECHOCARDIOGRAM (TEE);  Surgeon: Jackson Stade, MD;  Location: [facility] ENDOSCOPY;  Service: Cardiovascular;  Laterality: N/A;    There were no vitals filed for this visit.   Subjective Assessment - 04/21/20 1322    Subjective "It's getting better."    Currently in Pain? No/denies                 ADULT SLP TREATMENT - 04/21/20 1323      General Information   Behavior/Cognition Alert;Pleasant mood      Treatment Provided   Treatment provided Cognitive-Linquistic      Pain Assessment   Pain Assessment No/denies pain      Cognitive-Linquistic Treatment   Treatment focused on Dysarthria;Patient/family/caregiver education    Skilled Treatment Pt required rare min cues with HEP for dysarthria. Reports speech is 6/10 today, if 10 is his "normal" speech prior to CVA. We added to his HEP some phrases and sentences he would say at work. He required occasional min-mod cues to use strategies (we discussed using pausing in addition to slowing rate,  breaking up syllables).       Assessment / Recommendations / Plan   Plan Continue with current plan of care      Progression Toward Goals   Progression toward goals Progressing toward goals                SLP Long Term Goals - 04/21/20 1334      SLP LONG TERM GOAL #1   Title Pt will be independent with HEP for dysarthira    Time 6    Period Weeks    Status On-going      SLP LONG TERM GOAL #2   Title Pt will be 100% intelligble in noisy environment and while walking outside of clinic with rare min A over 2 sessions    Time 6    Period Weeks    Status On-going      SLP LONG TERM GOAL #3   Title Pt will use compensatory strategies to be 100% intelligible on phone calls with family and business with rare min A    Time 6    Period Weeks    Status On-going      SLP LONG TERM GOAL #4   Title Pt wil limprove score on Communicative Effectiveness Survey by 3 points (original score is 19)    Time 6    Period Weeks    Status  On-going            Plan - 04/21/20 1333    Clinical Impression Statement Jackson Terrell is referred for outpt ST due to slurred speech followng a cerebellar CVA. 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 He does not want to talk to clients or vendors due to slurred speech. He reports fatigue of speech musculature after talking for longer periods, as well as increased slur with fatigue. 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 He is intelligble with slow rate, however upon increasing his rate of speech, intelligilbity reduces to 95% face to face in a quiet room. 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 Requested certification for 6 weeks, however I believe he will likely be discharged sooner.    Speech Therapy Frequency 2x / week    Duration --   6 weeks or 13 visits   Treatment/Interventions SLP  instruction and feedback;Compensatory strategies;Functional tasks;Language facilitation;Patient/family education;Compensatory techniques;Cueing hierarchy;Environmental controls;Internal/external aids    Potential to Achieve Goals Good    SLP Home Exercise Plan HEP for dysarthria - see pt instructions    Consulted and Agree with Plan of Care Patient           Patient will benefit from skilled therapeutic intervention in order to improve the following deficits and impairments:   Dysarthria and anarthria    Problem List Patient Active Problem List   Diagnosis Date Noted  . 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 Depression 03/15/2020   Jackson Baton, MS, CCC-SLP Speech-Language Pathologist  Arlana Lindau 04/21/2020, 1:59 PM  [place] [facility] [address] [place], [place], [address] Phone: [phone]   Fax:  [phone]   Name: Jackson Terrell MRN: [id number] Date of Birth: [birth year]

## 2020-04-21 NOTE — Therapy (Signed)
[facility] [address] [place], [place], [address] Phone: [phone]   Fax:  [phone]  Occupational Therapy Treatment  Patient Details  Name: Jackson Terrell MRN: [id number] Date of Birth: 09/28/1990 Referring Provider (OT): Ihor Austin, NP ((pt was referred by hospitalist))   Encounter Date: 04/21/2020   OT End of Session - 04/21/20 1448    Visit Number 6    Number of Visits 17    Date for OT Re-Evaluation 05/26/20    Authorization Type BCBS    Authorization Time Period Week 3/8 (04/18/20)    OT Start Time 1448    OT Stop Time 1533    OT Time Calculation (min) 45 min    Activity Tolerance Patient tolerated treatment well    Behavior During Therapy [facility] for tasks assessed/performed           Past Medical History:  Diagnosis Date  . Scoliosis    mild    Past Surgical History:  Procedure Laterality Date  . BUBBLE STUDY  03/18/2020   Procedure: BUBBLE STUDY;  Surgeon: Wendall Stade, MD;  Location: [facility] ENDOSCOPY;  Service: Cardiovascular;;  . TEE WITHOUT CARDIOVERSION N/A 03/18/2020   Procedure: TRANSESOPHAGEAL ECHOCARDIOGRAM (TEE);  Surgeon: Wendall Stade, MD;  Location: [facility] ENDOSCOPY;  Service: Cardiovascular;  Laterality: N/A;    Vitals:   04/21/20 1449  BP: (!) 130/94     Subjective Assessment - 04/21/20 1449    Subjective  Patient is going into the office after hours now and doing work. Pt reports working from home was not working out well. Pt reports improvements but continues to have difficulty with slurring speech, handwriting, typing slower and walking balance.    Patient Stated Goals improve RUE dexterity so he get back to working from home    Currently in Pain? Yes    Pain Score 3     Pain Location Head    Pain Descriptors / Indicators Headache    Pain Type Acute pain           Treatment:  Patient reports being cleared for driving by NP yesterday. Pt has returned to work part  time and after hours for light work.  Patient completed small pegs with attention to pattern on left side and alternating attention with conversation with therapist.  Patient removed pegs with in hand manipulation with RUE requiring increased time and no drops.  Pt worked on Chiropractor with increased legibility of approximately 75%. Handwriting very intentional. Grooved peg board with RUE no drops and increased time required.      OT Education - 04/21/20 1522    Education Details Education on typing websites for practice.    Person(s) Educated Patient    Methods Explanation    Comprehension Verbalized understanding            OT Short Term Goals - 04/19/20 1533      OT SHORT TERM GOAL #1   Title I with HEP-04/29/20    Time 4    Period Weeks    Status On-going    Target Date 04/29/20      OT SHORT TERM GOAL #2   Title Pt will type 30 wpm with at least 85% accuracy.    Time 4    Period Weeks    Status Achieved      OT SHORT TERM GOAL #3   Title Pt will verbalize understanding of adapted strategies for ADLS/IADLS to maximize safety and I prn.  Time 4    Period Weeks    Status Achieved      OT SHORT TERM GOAL #4   Title Pt will demonstrate improved RUE fine motor coordination for ADLs as evidenced by decreasing 9 hole peg test score by 4 secs.    Baseline RUE 43.84 secs. LUE 24.03 secs    Time 4    Period Weeks    Status Achieved      OT SHORT TERM GOAL #5   Title Pt will  demonstrate improved UE functional use for ADLs as evidenced by increasing box/ blocks score by 4 blocks with RUE    Baseline RUE 40, LUE 55    Time 4    Status Achieved             OT Long Term Goals - 04/19/20 1533      OT LONG TERM GOAL #1   Title I with updated HEP.-05/27/20    Time 8    Period Weeks    Status On-going      OT LONG TERM GOAL #2   Title Pt will write a short paragraph with 100% legibility and only min decrease in letter size.    Time 8    Period Weeks     Status Achieved      OT LONG TERM GOAL #3   Title Pt will demonstrate improved fine motor coordination for ADLs as evidenced by decreasing 9 hole peg test score for RUE by 8 secs from eval    Baseline RUE 43.84 secs. LUE 24.03 secs    Time 8    Period Weeks    Status On-going      OT LONG TERM GOAL #4   Title Pt will  demonstrate improved RUE functional use for ADLs as evidenced by increasing box/ blocks score by 8 blocks    Baseline RUE 40 blocks, LUE 55    Time 8    Period Weeks    Status Achieved      OT LONG TERM GOAL #5   Title Pt will type 35 wpm with 90% or better accuracy in prep for return to work.    Time 8    Period Weeks    Status On-going      OT LONG TERM GOAL #6   Title Pt will resume prior level of home management / cooking at a modified I level demonstrating good safety awareness.    Time 8    Period Weeks    Status On-going                 Plan - 04/21/20 1448    Clinical Impression Statement Patient is showing excellent progress toward goals.  Patient is driving with his mom in the car - and reports no difficulty.    OT Occupational Profile and History Detailed Assessment- Review of Records and additional review of physical, cognitive, psychosocial history related to current functional performance    Occupational performance deficits (Please refer to evaluation for details): ADL's;IADL's;Work;Social Participation;Leisure    Body Structure / Function / Physical Skills ADL;UE functional use;Balance;Flexibility;Pain;FMC;ROM;Gait;GMC;Coordination;Decreased knowledge of precautions;Decreased knowledge of use of DME;IADL;Dexterity;Mobility;Tone;Strength    Rehab Potential Good    Clinical Decision Making Limited treatment options, no task modification necessary    Comorbidities Affecting Occupational Performance: May have comorbidities impacting occupational performance    Modification or Assistance to Complete Evaluation  No modification of tasks or assist  necessary to complete eval    OT Frequency 2x /  week   or 16 visits total plus eval   OT Duration 8 weeks    OT Treatment/Interventions Self-care/ADL training;Ultrasound;DME and/or AE instruction;Patient/family education;Paraffin;Passive range of motion;Balance training;Cryotherapy;Fluidtherapy;Splinting;Moist Heat;Therapeutic exercise;Manual Therapy;Therapeutic activities;Neuromuscular education    Plan assess typing speed and alternating attention with typing, check calendar and schedule additional 2 weeks after october if needed.    Consulted and Agree with Plan of Care Patient;Family member/caregiver           Patient will benefit from skilled therapeutic intervention in order to improve the following deficits and impairments:   Body Structure / Function / Physical Skills: ADL, UE functional use, Balance, Flexibility, Pain, FMC, ROM, Gait, GMC, Coordination, Decreased knowledge of precautions, Decreased knowledge of use of DME, IADL, Dexterity, Mobility, Tone, Strength       Visit Diagnosis: Muscle weakness (generalized)  Other symptoms and signs involving the nervous system  Unsteadiness on feet  Other abnormalities of gait and mobility  Other lack of coordination    Problem List Patient Active Problem List   Diagnosis Date Noted  . 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 Depression 03/15/2020    Junious Dresser MOT, OTR/L  04/21/2020, 3:37 PM  [place] [facility] [address] [place], [place], [address] Phone: [phone]   Fax:  [phone]  Name: Jackson Terrell MRN: [id number] Date of Birth: Dec 07, 1990

## 2020-04-21 NOTE — Patient Instructions (Signed)
Educated on typing practice websites:  [email or web address]

## 2020-04-26 ENCOUNTER — Other Ambulatory Visit: Payer: Self-pay

## 2020-04-26 ENCOUNTER — Encounter: Payer: Self-pay | Admitting: Occupational Therapy

## 2020-04-26 ENCOUNTER — Ambulatory Visit: Payer: BC Managed Care – PPO | Admitting: Physical Therapy

## 2020-04-26 ENCOUNTER — Ambulatory Visit: Payer: BC Managed Care – PPO | Admitting: Occupational Therapy

## 2020-04-26 ENCOUNTER — Ambulatory Visit: Payer: BC Managed Care – PPO

## 2020-04-26 VITALS — BP 160/92

## 2020-04-26 VITALS — BP 141/103 | HR 83

## 2020-04-26 DIAGNOSIS — R471 Dysarthria and anarthria: Secondary | ICD-10-CM

## 2020-04-26 DIAGNOSIS — R278 Other lack of coordination: Secondary | ICD-10-CM

## 2020-04-26 DIAGNOSIS — M6281 Muscle weakness (generalized): Secondary | ICD-10-CM

## 2020-04-26 DIAGNOSIS — R2689 Other abnormalities of gait and mobility: Secondary | ICD-10-CM

## 2020-04-26 DIAGNOSIS — R29818 Other symptoms and signs involving the nervous system: Secondary | ICD-10-CM

## 2020-04-26 DIAGNOSIS — R2681 Unsteadiness on feet: Secondary | ICD-10-CM

## 2020-04-26 NOTE — Patient Instructions (Signed)

## 2020-04-26 NOTE — Therapy (Signed)
[facility] [address] [place], [place], [address] Phone: [phone]   Fax:  [phone]  Physical Therapy Treatment  Patient Details  Name: Jackson Terrell MRN: [id number] Date of Birth: 11-03-1990 Referring Provider (PT): Dr. Sharolyn Douglas (hospitalist) Ihor Austin, NP; Aura Dials, MD (PCP))   Encounter Date: 04/26/2020   PT End of Session - 04/26/20 1359    Visit Number 8    Number of Visits 13    Authorization Type BCBS    PT Start Time 1319    PT Stop Time 1353    PT Time Calculation (min) 34 min    Equipment Utilized During Treatment Gait belt    Activity Tolerance Treatment limited secondary to medical complications (Comment)   BP measures   Behavior During Therapy [facility] for tasks assessed/performed           Past Medical History:  Diagnosis Date  . Scoliosis    mild    Past Surgical History:  Procedure Laterality Date  . BUBBLE STUDY  03/18/2020   Procedure: BUBBLE STUDY;  Surgeon: Wendall Stade, MD;  Location: [facility] ENDOSCOPY;  Service: Cardiovascular;;  . TEE WITHOUT CARDIOVERSION N/A 03/18/2020   Procedure: TRANSESOPHAGEAL ECHOCARDIOGRAM (TEE);  Surgeon: Wendall Stade, MD;  Location: [facility] ENDOSCOPY;  Service: Cardiovascular;  Laterality: N/A;    Vitals:   04/26/20 1329 04/26/20 1333 04/26/20 1346  BP: (!) 144/100 (!) 154/100 (!) 141/103  Pulse: 83       Subjective Assessment - 04/26/20 1321    Subjective Got back to work yesterday, and I made it through the day.  Was very tired at the end of day.    Patient is accompained by: Family member   mom in car   Patient Stated Goals Pt's goals for therapy are to improve walking straight and get R arm and hand work better.    Currently in Pain? No/denies    Pain Onset Today                             [facility] Adult PT Treatment/Exercise - 04/26/20 0001      Self-Care   Self-Care Other Self-Care Comments    Other Self-Care  Comments  Asked about if pt has gotten cuff; he has not.  At beginning of session, pt reports starting back to work yesterday (full day) and being fatigued at end of day.  Educated pt to rest fully and pay attention to fatigue with return to work; suggested rest breaks through the day.  Measured blood pressure x 3 during session, with diastolic BP elevated to near 100 (higher than parameters for safe working in therapy).  Did not proceed with activity, but provided education.  Educated pt in CVA warning signs/symptoms, fall prevention in the home.  Provided handout with BP measures for pt to give to MD tomorrow, as he will see his PCP tomorrow.                  PT Education - 04/26/20 1358    Education Details Fall prevention, CVA warning signs/symptoms; unable to check goals/perform activity due to BP parameters    Person(s) Educated Patient    Methods Explanation    Comprehension Verbalized understanding            PT Short Term Goals - 04/21/20 1711      PT SHORT TERM GOAL #1   Title Pt will be  independent with HEP for improved strength, balance, transfers, and gait.  TARGET 04/22/2020    Baseline Pt has been performing initial HEP as instructed and walking daily. 04/21/20    Time 4    Period Weeks    Status Achieved      PT SHORT TERM GOAL #2   Title Pt will improve TUG score to less than or equal to 13.5 sec with no device, for decreased fall risk.    Time 4    Period Weeks    Status New      PT SHORT TERM GOAL #3   Title Pt will improve SLS to at least 3 seconds each lower extremity for improved functional strength and balance.    Time 4    Period Weeks    Status New      PT SHORT TERM GOAL #4   Title Pt will improve DGI score to at least 16/24 for decreased fall risk.    Time 4    Period Weeks    Status New      PT SHORT TERM GOAL #5   Title Pt will verbalize understanding of fall prevention education for home environment.    Baseline Pt is using cane when goes  out of home for safety but not having to rely on it. He has been instructed to take his time with turns. No falls reported.    Time 4    Period Weeks    Status Achieved             PT Long Term Goals - 03/25/20 1605      PT LONG TERM GOAL #1   Title Pt will be independent with HEP for improved strength, balance, transfers, and gait.  TARGET 05/06/2020    Time 6    Period Weeks    Status New      PT LONG TERM GOAL #2   Title Pt will improve gait velocity to at least 3 ft/sec for improved gait efficiency and safety in community.    Time 6    Period Weeks    Status New      PT LONG TERM GOAL #3   Title Pt will improve DGI score to at least 19/24 for decreased fall risk.    Time 6    Period Weeks    Status New      PT LONG TERM GOAL #4   Title Pt will ambulate at least 1000 ft, indoor and outdoor surfaces, no device, independently, for safe return to independent community giat.    Time 6    Period Weeks    Status New      PT LONG TERM GOAL #5   Title Pt will report improvement in FOTO score at d/c by at least 20% for improved overall functional mobility.    Baseline 47.37%    Time 6    Period Weeks    Status New                 Plan - 04/26/20 1400    Clinical Impression Statement Continued with elevated BP measures this visit, and did not proceed with checking STGs.  Pt to see PCP tomorrow; education provided for fall prevention, warning signs/symptoms.    Personal Factors and Comorbidities Other;Comorbidity 1   HTN (BP elevated during PT session; almost to parameter unable to treat)   Comorbidities Hx of depression    Examination-Activity Limitations Locomotion Level;Stairs;Stand    Examination-Participation Restrictions Occupation;Community Activity  Stability/Clinical Decision Making Stable/Uncomplicated    Rehab Potential Good    PT Frequency 2x / week    PT Duration 6 weeks   plus eval   PT Treatment/Interventions ADLs/Self Care Home Management;DME  Instruction;Gait training;Stair training;Functional mobility training;Therapeutic activities;Therapeutic exercise;Balance training;Neuromuscular re-education;Patient/family education;Vestibular    PT Next Visit Plan Check BP. Has pt been able to get a home monitor? Check remaining STGs. Continue dynamic balance with SLS activities, coordination activities,Balance on compliant surfaces and Eyes closed,  functional strengthening.    Consulted and Agree with Plan of Care Patient           Patient will benefit from skilled therapeutic intervention in order to improve the following deficits and impairments:  Abnormal gait, Difficulty walking, Dizziness, Decreased balance, Decreased mobility, Decreased strength  Visit Diagnosis: Unsteadiness on feet  Other abnormalities of gait and mobility     Problem List Patient Active Problem List   Diagnosis Date Noted  . 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 Depression 03/15/2020    Marisue Canion W. 04/26/2020, 2:56 PM  Gean Maidens., PT  [facility] [address] [place], [place], [address] Phone: [phone]   Fax:  [phone]  Name: STEPHANOS FAN MRN: [id number] Date of Birth: Oct 03, 1990

## 2020-04-26 NOTE — Therapy (Signed)
[facility] [address] [place], [place], [address] Phone: [phone]   Fax:  [phone]  Occupational Therapy Treatment  Patient Details  Name: Jackson Terrell MRN: [id number] Date of Birth: [birth year] Referring Provider (OT): Frann Rider, NP ((pt was referred by hospitalist))   Encounter Date: 04/26/2020   OT End of Session - 04/26/20 1558    Visit Number 7    Number of Visits 17    Date for OT Re-Evaluation 05/26/20    Authorization Type BCBS    Authorization Time Period Week 3/8 (04/18/20)    OT Start Time 1405    OT Stop Time 1445    OT Time Calculation (min) 40 min    Activity Tolerance Patient tolerated treatment well    Behavior During Therapy [facility] for tasks assessed/performed           Past Medical History:  Diagnosis Date  . Scoliosis    mild    Past Surgical History:  Procedure Laterality Date  . BUBBLE STUDY  03/18/2020   Procedure: BUBBLE STUDY;  Surgeon: Josue Hector, MD;  Location: [place];  Service: Cardiovascular;;  . TEE WITHOUT CARDIOVERSION N/A 03/18/2020   Procedure: TRANSESOPHAGEAL ECHOCARDIOGRAM (TEE);  Surgeon: Josue Hector, MD;  Location: [facility] ENDOSCOPY;  Service: Cardiovascular;  Laterality: N/A;    Vitals:   04/26/20 1406  BP: (!) 160/92     Subjective Assessment - 04/26/20 1408    Subjective  Pt reports he worked a full day yesterday    Patient Stated Goals improve RUE dexterity so he get back to working from home    Currently in Pain? No/denies               Treatment:Typing activity, pt met his long term goal. Writing activity, pt wrote 2 paragraphs with increased time and !00 legibility. Coban wrap added to pen as pt is gripping very tightly. Therapist checked short term goals and monitored BP as his BP was elevated with PT. Grooved pegboard with RUE for increased fine motor coordination occasional min difficulty.                 OT  Education - 04/26/20 1559    Education Details Recommendation that pt does not go in to work this afternoon as his BP has been elevated, pt sees MD tomorrow.    Person(s) Educated Patient    Methods Explanation;Verbal cues    Comprehension Verbalized understanding            OT Short Term Goals - 04/26/20 1421      OT SHORT TERM GOAL #1   Title I with HEP-04/29/20    Time 4    Period Weeks    Status On-going    Target Date 04/29/20      OT SHORT TERM GOAL #2   Title Pt will type 30 wpm with at least 85% accuracy.    Time 4    Period Weeks    Status Achieved      OT SHORT TERM GOAL #3   Title Pt will verbalize understanding of adapted strategies for ADLS/IADLS to maximize safety and I prn.    Time 4    Period Weeks    Status Achieved      OT SHORT TERM GOAL #4   Title Pt will demonstrate improved RUE fine motor coordination for ADLs as evidenced by decreasing 9 hole peg test score by 4 secs.    Baseline RUE  43.84 secs. LUE 24.03 secs    Time 4    Period Weeks    Status Achieved      OT SHORT TERM GOAL #5   Title Pt will  demonstrate improved UE functional use for ADLs as evidenced by increasing box/ blocks score by 4 blocks with RUE    Baseline RUE 40, LUE 55    Time 4    Status Achieved             OT Long Term Goals - 04/26/20 1416      OT LONG TERM GOAL #1   Title I with updated HEP.-05/27/20    Time 8    Period Weeks    Status On-going      OT LONG TERM GOAL #2   Title Pt will write a short paragraph with 100% legibility and only min decrease in letter size.    Time 8    Period Weeks    Status Achieved      OT LONG TERM GOAL #3   Title Pt will demonstrate improved fine motor coordination for ADLs as evidenced by decreasing 9 hole peg test score for RUE by 8 secs from eval    Baseline RUE 43.84 secs. LUE 24.03 secs    Time 8    Period Weeks    Status On-going      OT LONG TERM GOAL #4   Title Pt will  demonstrate improved RUE functional use for  ADLs as evidenced by increasing box/ blocks score by 8 blocks    Baseline RUE 40 blocks, LUE 55    Time 8    Period Weeks    Status Achieved      OT LONG TERM GOAL #5   Title Pt will type 35 wpm with 90% or better accuracy in prep for return to work.    Time 8    Period Weeks    Status Achieved   40 wpm 95%     OT LONG TERM GOAL #6   Title Pt will resume prior level of home management / cooking at a modified I level demonstrating good safety awareness.    Time 8    Period Weeks    Status On-going   Pt reports performing laundry, folding it and simple cooking mod I                Plan - 04/26/20 1424    Clinical Impression Statement Pt is progressing towards goals. He met his long term goal for typing speed.    OT Occupational Profile and History Detailed Assessment- Review of Records and additional review of physical, cognitive, psychosocial history related to current functional performance    Occupational performance deficits (Please refer to evaluation for details): ADL's;IADL's;Work;Social Participation;Leisure    Body Structure / Function / Physical Skills ADL;UE functional use;Balance;Flexibility;Pain;FMC;ROM;Gait;GMC;Coordination;Decreased knowledge of precautions;Decreased knowledge of use of DME;IADL;Dexterity;Mobility;Tone;Strength    Rehab Potential Good    Clinical Decision Making Limited treatment options, no task modification necessary    Comorbidities Affecting Occupational Performance: May have comorbidities impacting occupational performance    Modification or Assistance to Complete Evaluation  No modification of tasks or assist necessary to complete eval    OT Frequency 2x / week   or 16 visits total plus eval   OT Duration 8 weeks    OT Treatment/Interventions Self-care/ADL training;Ultrasound;DME and/or AE instruction;Patient/family education;Paraffin;Passive range of motion;Balance training;Cryotherapy;Fluidtherapy;Splinting;Moist Heat;Therapeutic  exercise;Manual Therapy;Therapeutic activities;Neuromuscular education    Plan simple cooking task,  monitor BP, work toward long term goals, anticipate pt will be ready for d/c end of October. No additional visits scheduled.    Consulted and Agree with Plan of Care Patient;Family member/caregiver           Patient will benefit from skilled therapeutic intervention in order to improve the following deficits and impairments:   Body Structure / Function / Physical Skills: ADL, UE functional use, Balance, Flexibility, Pain, FMC, ROM, Gait, GMC, Coordination, Decreased knowledge of precautions, Decreased knowledge of use of DME, IADL, Dexterity, Mobility, Tone, Strength       Visit Diagnosis: Muscle weakness (generalized)  Other symptoms and signs involving the nervous system  Other lack of coordination    Problem List Patient Active Problem List   Diagnosis Date Noted  . 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 Depression 03/15/2020    Shaheem Pichon 04/26/2020, 4:00 PM  [facility] [address] [place], [place], [address] Phone: [phone]   Fax:  [phone]  Name: TAHIR BLANK MRN: [id number] Date of Birth: 1991-01-16

## 2020-04-26 NOTE — Therapy (Signed)
[facility] [address] [place], [place], [address] Phone: [phone]   Fax:  [phone]  Speech Language Pathology Treatment  Patient Details  Name: IVAR DOMANGUE MRN: [id number] Date of Birth: 15-May-1991 Referring Provider (SLP): Ihor Austin NP   Encounter Date: 04/26/2020   End of Session - 04/26/20 1703    Visit Number 3    Number of Visits 13    Date for SLP Re-Evaluation 05/25/20    SLP Start Time 1450    SLP Stop Time  1530    SLP Time Calculation (min) 40 min    Activity Tolerance Patient tolerated treatment well           Past Medical History:  Diagnosis Date  . Scoliosis    mild    Past Surgical History:  Procedure Laterality Date  . BUBBLE STUDY  03/18/2020   Procedure: BUBBLE STUDY;  Surgeon: Wendall Stade, MD;  Location: [facility] ENDOSCOPY;  Service: Cardiovascular;;  . TEE WITHOUT CARDIOVERSION N/A 03/18/2020   Procedure: TRANSESOPHAGEAL ECHOCARDIOGRAM (TEE);  Surgeon: Wendall Stade, MD;  Location: [facility] ENDOSCOPY;  Service: Cardiovascular;  Laterality: N/A;    There were no vitals filed for this visit.   Subjective Assessment - 04/26/20 1511    Subjective "Do you like [id number]?"    Currently in Pain? No/denies                 ADULT SLP TREATMENT - 04/26/20 1512      General Information   Behavior/Cognition Cooperative;Alert;Pleasant mood      Treatment Provided   Treatment provided Cognitive-Linquistic      Cognitive-Linquistic Treatment   Treatment focused on Dysarthria;Patient/family/caregiver education    Skilled Treatment Pt required occasional mod cues with everyday phrases at work. Reports speech is 6.5/10 today, if 10 is his "normal" speech prior to CVA. In short 2-3 minute conversations with SLP, [name] self corrected We added to his HEP some phrases and sentences he would say at work. He required occasional min-mod cues to use strategies (we discussed using  pausing in addition to slowing rate, breaking up syllables).       Assessment / Recommendations / Plan   Plan Continue with current plan of care      Progression Toward Goals   Progression toward goals Progressing toward goals                SLP Long Term Goals - 04/26/20 1703      SLP LONG TERM GOAL #1   Title Pt will be independent with HEP for dysarthira    Time 5    Period Weeks    Status On-going      SLP LONG TERM GOAL #2   Title Pt will be 100% intelligble in noisy environment and while walking outside of clinic with rare min A over 2 sessions    Time 5    Period Weeks    Status On-going      SLP LONG TERM GOAL #3   Title Pt will use compensatory strategies to be 100% intelligible on phone calls with family and business with rare min A    Time 5    Period Weeks    Status On-going      SLP LONG TERM GOAL #4   Title Pt wil limprove score on Communicative Effectiveness Survey by 3 points (original score is 19)    Time 5    Period Weeks    Status On-going  Plan - 04/26/20 1703    Clinical Impression Statement Randle Shatzer is referred for outpt ST due to slurred speech followng a cerebellar CVA. 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 He does not want to talk to clients or vendors due to slurred speech. He reports fatigue of speech musculature after talking for longer periods, as well as increased slur with fatigue. 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 He is intelligble with slow rate, however upon increasing his rate of speech, intelligilbity reduces to 95% face to face in a quiet room. 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 Requested certification for 6 weeks, however I believe he will likely be discharged sooner.    Speech Therapy Frequency 2x / week    Duration --   6 weeks or 13 visits     Treatment/Interventions SLP instruction and feedback;Compensatory strategies;Functional tasks;Language facilitation;Patient/family education;Compensatory techniques;Cueing hierarchy;Environmental controls;Internal/external aids    Potential to Achieve Goals Good    SLP Home Exercise Plan HEP for dysarthria - see pt instructions    Consulted and Agree with Plan of Care Patient           Patient will benefit from skilled therapeutic intervention in order to improve the following deficits and impairments:   Dysarthria and anarthria    Problem List Patient Active Problem List   Diagnosis Date Noted  . 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 Depression 03/15/2020    [facility] ,MS, CCC-SLP  04/26/2020, 5:04 PM  [facility] [address] [place], [place], [address] Phone: [phone]   Fax:  [phone]   Name: RAMONTE MENA MRN: [id number] Date of Birth: August 23, 1990

## 2020-04-26 NOTE — Patient Instructions (Signed)
  You should work approximately 30 minutes/day on your speech with the handouts I provided. You can also read out loud.  Focus on slowing down your speech rate, and over-pronouncing your speech.

## 2020-04-29 ENCOUNTER — Other Ambulatory Visit: Payer: Self-pay

## 2020-04-29 ENCOUNTER — Encounter: Payer: Self-pay | Admitting: Occupational Therapy

## 2020-04-29 ENCOUNTER — Ambulatory Visit: Payer: BC Managed Care – PPO

## 2020-04-29 ENCOUNTER — Ambulatory Visit: Payer: BC Managed Care – PPO | Admitting: Occupational Therapy

## 2020-04-29 VITALS — BP 134/91 | HR 70

## 2020-04-29 VITALS — BP 148/94

## 2020-04-29 DIAGNOSIS — M6281 Muscle weakness (generalized): Secondary | ICD-10-CM | POA: Diagnosis not present

## 2020-04-29 DIAGNOSIS — R2681 Unsteadiness on feet: Secondary | ICD-10-CM

## 2020-04-29 DIAGNOSIS — R278 Other lack of coordination: Secondary | ICD-10-CM

## 2020-04-29 DIAGNOSIS — R2689 Other abnormalities of gait and mobility: Secondary | ICD-10-CM

## 2020-04-29 DIAGNOSIS — R471 Dysarthria and anarthria: Secondary | ICD-10-CM

## 2020-04-29 DIAGNOSIS — R29818 Other symptoms and signs involving the nervous system: Secondary | ICD-10-CM

## 2020-04-29 NOTE — Patient Instructions (Signed)
   Use a visual cue to remind you to keep your rate slow and use your overpronounciation.

## 2020-04-29 NOTE — Therapy (Signed)
[facility] [address] [place], [place], [address] Phone: [phone]   Fax:  [phone]  Occupational Therapy Treatment  Patient Details  Name: Jackson Terrell MRN: [id number] Date of Birth: 1990/08/30 Referring Provider (OT): Ihor Austin, NP ((pt was referred by hospitalist))   Encounter Date: 04/29/2020   OT End of Session - 04/29/20 1321    Visit Number 8    Number of Visits 17    Date for OT Re-Evaluation 05/26/20    Authorization Type BCBS    Authorization Time Period Week 4/8 (04/25/20)    OT Start Time 1319    OT Stop Time 1400    OT Time Calculation (min) 41 min    Activity Tolerance Patient tolerated treatment well    Behavior During Therapy [facility] for tasks assessed/performed           Past Medical History:  Diagnosis Date  . Scoliosis    mild    Past Surgical History:  Procedure Laterality Date  . BUBBLE STUDY  03/18/2020   Procedure: BUBBLE STUDY;  Surgeon: Wendall Stade, MD;  Location: [facility] ENDOSCOPY;  Service: Cardiovascular;;  . TEE WITHOUT CARDIOVERSION N/A 03/18/2020   Procedure: TRANSESOPHAGEAL ECHOCARDIOGRAM (TEE);  Surgeon: Wendall Stade, MD;  Location: [facility] ENDOSCOPY;  Service: Cardiovascular;  Laterality: N/A;    Vitals:   04/29/20 1327 04/29/20 1328  BP: (!) 131/101 (!) 134/91  Pulse: 84 70     Subjective Assessment - 04/29/20 1323    Subjective  Pt reports receiving medication for blood pressure and monitoring blood pressure at home. Pt could not recall the measure.    Patient Stated Goals improve RUE dexterity so he get back to working from home    Currently in Pain? No/denies            Treatment: Typing - Typing Master animal word tris and cloud games with decreased accuracy from previous trial. Pt completed typing test with 2 minute challenge with scores of 37 wpm with 90% accuracy  Scheduling a day activity with Functional Problem Solving - min verbal cues and  increased time required to complete task.  See education re: discharge.                OT Education - 04/29/20 1359    Education Details Education provided regarding discharge plan and progressing towards goals.    Person(s) Educated Patient    Methods Explanation    Comprehension Verbalized understanding;Need further instruction            OT Short Term Goals - 04/26/20 1421      OT SHORT TERM GOAL #1   Title I with HEP-04/29/20    Time 4    Period Weeks    Status On-going    Target Date 04/29/20      OT SHORT TERM GOAL #2   Title Pt will type 30 wpm with at least 85% accuracy.    Time 4    Period Weeks    Status Achieved      OT SHORT TERM GOAL #3   Title Pt will verbalize understanding of adapted strategies for ADLS/IADLS to maximize safety and I prn.    Time 4    Period Weeks    Status Achieved      OT SHORT TERM GOAL #4   Title Pt will demonstrate improved RUE fine motor coordination for ADLs as evidenced by decreasing 9 hole peg test score by 4 secs.  Baseline RUE 43.84 secs. LUE 24.03 secs    Time 4    Period Weeks    Status Achieved      OT SHORT TERM GOAL #5   Title Pt will  demonstrate improved UE functional use for ADLs as evidenced by increasing box/ blocks score by 4 blocks with RUE    Baseline RUE 40, LUE 55    Time 4    Status Achieved             OT Long Term Goals - 04/26/20 1416      OT LONG TERM GOAL #1   Title I with updated HEP.-05/27/20    Time 8    Period Weeks    Status On-going      OT LONG TERM GOAL #2   Title Pt will write a short paragraph with 100% legibility and only min decrease in letter size.    Time 8    Period Weeks    Status Achieved      OT LONG TERM GOAL #3   Title Pt will demonstrate improved fine motor coordination for ADLs as evidenced by decreasing 9 hole peg test score for RUE by 8 secs from eval    Baseline RUE 43.84 secs. LUE 24.03 secs    Time 8    Period Weeks    Status On-going        OT LONG TERM GOAL #4   Title Pt will  demonstrate improved RUE functional use for ADLs as evidenced by increasing box/ blocks score by 8 blocks    Baseline RUE 40 blocks, LUE 55    Time 8    Period Weeks    Status Achieved      OT LONG TERM GOAL #5   Title Pt will type 35 wpm with 90% or better accuracy in prep for return to work.    Time 8    Period Weeks    Status Achieved   40 wpm 95%     OT LONG TERM GOAL #6   Title Pt will resume prior level of home management / cooking at a modified I level demonstrating good safety awareness.    Time 8    Period Weeks    Status On-going   Pt reports performing laundry, folding it and simple cooking mod I                Plan - 04/29/20 1322    Clinical Impression Statement Pt progressing towards goals. Continuing to work on increasing typing speed and accuracy for return to work.    OT Occupational Profile and History Detailed Assessment- Review of Records and additional review of physical, cognitive, psychosocial history related to current functional performance    Occupational performance deficits (Please refer to evaluation for details): ADL's;IADL's;Work;Social Participation;Leisure    Body Structure / Function / Physical Skills ADL;UE functional use;Balance;Flexibility;Pain;FMC;ROM;Gait;GMC;Coordination;Decreased knowledge of precautions;Decreased knowledge of use of DME;IADL;Dexterity;Mobility;Tone;Strength    Rehab Potential Good    Clinical Decision Making Limited treatment options, no task modification necessary    Comorbidities Affecting Occupational Performance: May have comorbidities impacting occupational performance    Modification or Assistance to Complete Evaluation  No modification of tasks or assist necessary to complete eval    OT Frequency 2x / week   or 16 visits total plus eval   OT Duration 8 weeks    OT Treatment/Interventions Self-care/ADL training;Ultrasound;DME and/or AE instruction;Patient/family  education;Paraffin;Passive range of motion;Balance training;Cryotherapy;Fluidtherapy;Splinting;Moist Heat;Therapeutic exercise;Manual Therapy;Therapeutic activities;Neuromuscular education  Plan anticipate pt will be ready for d/c end of October. No additional visits scheduled.    Consulted and Agree with Plan of Care Patient;Family member/caregiver           Patient will benefit from skilled therapeutic intervention in order to improve the following deficits and impairments:   Body Structure / Function / Physical Skills: ADL, UE functional use, Balance, Flexibility, Pain, FMC, ROM, Gait, GMC, Coordination, Decreased knowledge of precautions, Decreased knowledge of use of DME, IADL, Dexterity, Mobility, Tone, Strength       Visit Diagnosis: Muscle weakness (generalized)  Other symptoms and signs involving the nervous system  Other lack of coordination  Unsteadiness on feet    Problem List Patient Active Problem List   Diagnosis Date Noted  . 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 Depression 03/15/2020    Junious Dresser MOT, OTR/L  04/29/2020, 3:34 PM  [facility] [address] [place], [place], [address] Phone: [phone]   Fax:  [phone]  Name: Jackson Terrell MRN: [id number] Date of Birth: [birth year]

## 2020-04-29 NOTE — Therapy (Signed)
[facility] [address] [place], [place], [address] Phone: [phone]   Fax:  [phone]  Speech Language Pathology Treatment  Patient Details  Name: Jackson Terrell MRN: [id number] Date of Birth: 01/01/1991 Referring Provider (SLP): Ihor Austin NP   Encounter Date: 04/29/2020   End of Session - 04/29/20 1620    Visit Number 4    Number of Visits 13    Date for SLP Re-Evaluation 05/25/20    SLP Start Time 1450    SLP Stop Time  1530    SLP Time Calculation (min) 40 min    Activity Tolerance Patient tolerated treatment well           Past Medical History:  Diagnosis Date  . Scoliosis    mild    Past Surgical History:  Procedure Laterality Date  . BUBBLE STUDY  03/18/2020   Procedure: BUBBLE STUDY;  Surgeon: Wendall Stade, MD;  Location: [facility] ENDOSCOPY;  Service: Cardiovascular;;  . TEE WITHOUT CARDIOVERSION N/A 03/18/2020   Procedure: TRANSESOPHAGEAL ECHOCARDIOGRAM (TEE);  Surgeon: Wendall Stade, MD;  Location: [facility] ENDOSCOPY;  Service: Cardiovascular;  Laterality: N/A;    There were no vitals filed for this visit.          ADULT SLP TREATMENT - 04/29/20 1505      General Information   Behavior/Cognition Cooperative;Alert;Pleasant mood      Treatment Provided   Treatment provided Cognitive-Linquistic      Cognitive-Linquistic Treatment   Treatment focused on Dysarthria;Patient/family/caregiver education    Skilled Treatment SLP engaged pt in 10 minute conversational situations - in first conversation pt focused 70-80% of the time on slowed rate and exaggerated articulation (artic), 70% in second conversation, and 75% in third conversation. SLP educated pt about external cues - pt told SLP that visual cue would work better than tactile cue. SLP told pt to cont to do what he is doing because his speech was better today than in previous session this week.      Assessment / Recommendations /  Plan   Plan Continue with current plan of care      Progression Toward Goals   Progression toward goals Progressing toward goals            SLP Education - 04/29/20 1619    Education Details external cues    Person(s) Educated Patient    Methods Explanation    Comprehension Verbalized understanding              SLP Long Term Goals - 04/29/20 1620      SLP LONG TERM GOAL #1   Title Pt will be independent with HEP for dysarthira    Time 5    Period Weeks    Status On-going      SLP LONG TERM GOAL #2   Title Pt will be 100% intelligble in noisy environment and while walking outside of clinic with rare min A over 2 sessions    Time 5    Period Weeks    Status On-going      SLP LONG TERM GOAL #3   Title Pt will use compensatory strategies to be 100% intelligible on phone calls with family and business with rare min A    Time 5    Period Weeks    Status On-going      SLP LONG TERM GOAL #4   Title Pt wil limprove score on Communicative Effectiveness Survey by 3 points (original score is  19)    Time 5    Period Weeks    Status On-going            Plan - 04/29/20 1620    Clinical Impression Statement Jackson Terrell cont to be seen for outpt ST due to slurred speech followng a cerebellar CVA. 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 He does not want to talk to clients or vendors due to slurred speech. He reports fatigue of speech musculature after talking for longer periods, as well as increased slur with fatigue. 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 He is intelligble with slow rate, however upon increasing his rate of speech, intelligilbity reduces to 95% face to face in a quiet room. 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 Requested certification for 6 weeks, however I believe he will likely be discharged  sooner.    Speech Therapy Frequency 2x / week    Duration --   6 weeks or 13 visits   Treatment/Interventions SLP instruction and feedback;Compensatory strategies;Functional tasks;Language facilitation;Patient/family education;Compensatory techniques;Cueing hierarchy;Environmental controls;Internal/external aids    Potential to Achieve Goals Good    SLP Home Exercise Plan HEP for dysarthria - see pt instructions    Consulted and Agree with Plan of Care Patient           Patient will benefit from skilled therapeutic intervention in order to improve the following deficits and impairments:   Dysarthria and anarthria    Problem List Patient Active Problem List   Diagnosis Date Noted  . 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 Depression 03/15/2020    [facility] ,MS, CCC-SLP  04/29/2020, 4:21 PM  [facility] [address] [place], [place], [address] Phone: [phone]   Fax:  [phone]   Name: Jackson Terrell MRN: [id number] Date of Birth: 1991/06/17

## 2020-04-29 NOTE — Therapy (Signed)
[facility] [address] [place], [place], [address] Phone: [phone]   Fax:  [phone]  Physical Therapy Treatment  Patient Details  Name: Jackson Terrell MRN: [id number] Date of Birth: 1991-05-28 Referring Provider (PT): Dr. Sharolyn Douglas (hospitalist) Ihor Austin, NP; Aura Dials, MD (PCP))   Encounter Date: 04/29/2020   PT End of Session - 04/29/20 1514    Visit Number 9    Number of Visits 13    Authorization Type BCBS    PT Start Time 0200    PT Stop Time 0243    PT Time Calculation (min) 43 min    Equipment Utilized During Treatment Gait belt    Activity Tolerance Treatment limited secondary to medical complications (Comment)   BP measures   Behavior During Therapy [facility] for tasks assessed/performed           Past Medical History:  Diagnosis Date  . Scoliosis    mild    Past Surgical History:  Procedure Laterality Date  . BUBBLE STUDY  03/18/2020   Procedure: BUBBLE STUDY;  Surgeon: Wendall Stade, MD;  Location: [facility] ENDOSCOPY;  Service: Cardiovascular;;  . TEE WITHOUT CARDIOVERSION N/A 03/18/2020   Procedure: TRANSESOPHAGEAL ECHOCARDIOGRAM (TEE);  Surgeon: Wendall Stade, MD;  Location: [facility] ENDOSCOPY;  Service: Cardiovascular;  Laterality: N/A;    Vitals:   04/29/20 1430 04/29/20 1440 04/29/20 1528  BP: (!) 148/98 (!) 158/90 (!) 148/94     Subjective Assessment - 04/29/20 1419    Subjective Pt reports no new issues. Pt is back at work just doing things a bit more slowly. Pt reports no new falls and no pain. Pt saw PCP and they started him on lisinopril. He has wrist BP cuff and arm cuff coming to monitor.    Patient Stated Goals Pt's goals for therapy are to improve walking straight and get R arm and hand work better.    Currently in Pain? No/denies              [facility] PT Assessment - 04/29/20 1430      Functional Gait  Assessment   Gait assessed  Yes    Gait Level Surface Walks 20  ft in less than 5.5 sec, no assistive devices, good speed, no evidence for imbalance, normal gait pattern, deviates no more than 6 in outside of the 12 in walkway width.    Change in Gait Speed Able to smoothly change walking speed without loss of balance or gait deviation. Deviate no more than 6 in outside of the 12 in walkway width.    Gait with Horizontal Head Turns Performs head turns smoothly with no change in gait. Deviates no more than 6 in outside 12 in walkway width    Gait with Vertical Head Turns Performs head turns with no change in gait. Deviates no more than 6 in outside 12 in walkway width.    Gait and Pivot Turn Pivot turns safely within 3 sec and stops quickly with no loss of balance.    Step Over Obstacle Is able to step over 2 stacked shoe boxes taped together (9 in total height) without changing gait speed. No evidence of imbalance.    Gait with Narrow Base of Support Ambulates 7-9 steps.    Gait with Eyes Closed Walks 20 ft, uses assistive device, slower speed, mild gait deviations, deviates 6-10 in outside 12 in walkway width. Ambulates 20 ft in less than 9 sec but greater than 7 sec.  Ambulating Backwards Walks 20 ft, uses assistive device, slower speed, mild gait deviations, deviates 6-10 in outside 12 in walkway width.    Steps Alternating feet, no rail.    Total Score 27    FGA comment: Pt with mild deficits with tandem walking, walking with EC, and backwards walking. Pt with most severe deficits with walking with EC with severe drift to the right.                         [facility] Adult PT Treatment/Exercise - 04/29/20 1421      Ambulation/Gait   Ambulation/Gait Yes    Ambulation/Gait Assistance 5: Supervision    Ambulation/Gait Assistance Details Pt seen for goal check today. Gait was assessed for DGI and FGA as well as TUG.    Ambulation Distance (Feet) 320 Feet    Assistive device None    Gait Pattern Step-through pattern    Ambulation Surface  Level;Indoor    Gait velocity 1.1 m/s    Gait Comments Pt with gait during reassessment of STGs for DGI and TUG and assessment of FGA for new LTG.      Standardized Balance Assessment   Standardized Balance Assessment Dynamic Gait Index;Timed Up and Go Test      Dynamic Gait Index   Level Surface Normal    Change in Gait Speed Normal    Gait with Horizontal Head Turns Normal    Gait with Vertical Head Turns Normal    Gait and Pivot Turn Normal    Step Over Obstacle Normal    Step Around Obstacles Normal    Steps Normal    Total Score 24      Timed Up and Go Test   TUG Normal TUG    Normal TUG (seconds) 8      Neuro Re-ed    Neuro Re-ed Details  Pt was instructed in tapping 5 cones in multiple planes to encourage increased SLS time and stability and enhance dynamic balance. Pt completed 4 reps of 5 cones with each leg on solid surface and an additional 4 reps on airex. Pt with 1 posterior LOB requiring min A to correct. Pt with more difficulty on RLE SLS, so PT instructed pt in 2 additional trials in R stance phase.                   PT Education - 04/29/20 1513    Education Details Pt educated on achievement of STGs and progress towards LTGs. Pt educated on POC and new goals. Pt to continue to monitor his BP.    Person(s) Educated Patient    Methods Explanation;Demonstration    Comprehension Verbalized understanding            PT Short Term Goals - 04/29/20 1523      PT SHORT TERM GOAL #1   Title Pt will be independent with HEP for improved strength, balance, transfers, and gait.  TARGET 04/22/2020    Baseline Pt has been performing initial HEP as instructed and walking daily. 04/21/20    Time 4    Period Weeks    Status Achieved      PT SHORT TERM GOAL #2   Title Pt will improve TUG score to less than or equal to 13.5 sec with no device, for decreased fall risk.    Baseline 04/29/20 8 seconds    Time 4    Period Weeks    Status Achieved  PT SHORT TERM  GOAL #3   Title Pt will improve SLS to at least 3 seconds each lower extremity for improved functional strength and balance.    Baseline 4 seconds on RLE, 20 s on LLE 04/29/20    Time 4    Period Weeks    Status Achieved      PT SHORT TERM GOAL #4   Title Pt will improve DGI score to at least 16/24 for decreased fall risk.    Baseline 24/24 04/29/20    Time 4    Period Weeks    Status Achieved      PT SHORT TERM GOAL #5   Title Pt will verbalize understanding of fall prevention education for home environment.    Baseline Pt is using cane when goes out of home for safety but not having to rely on it. He has been instructed to take his time with turns. No falls reported.    Time 4    Period Weeks    Status Achieved             PT Long Term Goals - 04/29/20 1531      PT LONG TERM GOAL #1   Title Pt will be independent with HEP for improved strength, balance, transfers, and gait.  TARGET 05/06/2020    Baseline Pt reports indep with HEP 04/29/20    Time 6    Period Weeks    Status On-going      PT LONG TERM GOAL #2   Title Pt will improve gait velocity to at least 3 ft/sec for improved gait efficiency and safety in community.    Time 6    Period Weeks    Status On-going      PT LONG TERM GOAL #3   Title Pt will improve DGI score to at least 19/24 for decreased fall risk.    Baseline 24/24 04/29/20    Time 6    Period Weeks    Status Achieved      PT LONG TERM GOAL #4   Title Pt will ambulate at least 1000 ft, indoor and outdoor surfaces, no device, independently, for safe return to independent community giat.    Time 6    Period Weeks    Status On-going      PT LONG TERM GOAL #5   Title Pt will report improvement in FOTO score at d/c by at least 20% for improved overall functional mobility.    Baseline 47.37%    Time 6    Period Weeks    Status New      Additional Long Term Goals   Additional Long Term Goals Yes      PT LONG TERM GOAL #6   Title Pt will  improve score on FGA to 30/30 for improved balance with functional mobility    Baseline 27/30 04/29/20    Time 6    Period Weeks    Status New    Target Date 05/06/20                 Plan - 04/29/20 1515    Clinical Impression Statement Pt is making excellent progress towards goals. Pt has achieved all STGs at this time and PT has created new goals due to pt's progress. Pt improved on TUG from ~14 s to 8 s with no AD and supervision, thus falling WNL for this test and no longer being at risk for falls. Pt was able to tolerate 4 seconds  of SLS on RLE and 20 seconds on LLE. Pt significantly improved on DGI and achieved a 24/24 during today's visit. Pt is no longer at risk for falls at this time due to standardized outcome measures (TUG and DGI). He was still challenged with higher level tasks like SLS and tandem gait. Pt will continue to benefit from skilled PT.    Personal Factors and Comorbidities Other;Comorbidity 1   HTN (BP elevated during PT session; almost to parameter unable to treat)   Comorbidities Hx of depression    Examination-Activity Limitations Locomotion Level;Stairs;Stand    Examination-Participation Restrictions Occupation;Community Activity    Stability/Clinical Decision Making Stable/Uncomplicated    Rehab Potential Good    PT Frequency 2x / week    PT Duration 6 weeks   plus eval   PT Treatment/Interventions ADLs/Self Care Home Management;DME Instruction;Gait training;Stair training;Functional mobility training;Therapeutic activities;Therapeutic exercise;Balance training;Neuromuscular re-education;Patient/family education;Vestibular    PT Next Visit Plan Check BP. Continue dynamic balance with SLS activities, coordination activities, Balance on compliant surfaces and Eyes closed, functional strengthening.    Consulted and Agree with Plan of Care Patient           Patient will benefit from skilled therapeutic intervention in order to improve the following deficits  and impairments:  Abnormal gait, Difficulty walking, Dizziness, Decreased balance, Decreased mobility, Decreased strength  Visit Diagnosis: Muscle weakness (generalized)  Other abnormalities of gait and mobility  Other lack of coordination     Problem List Patient Active Problem List   Diagnosis Date Noted  . 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 Depression 03/15/2020    Isabella Bowensanielle Petrita Blunck, SPT 04/29/2020, 3:47 PM   [facility] [address] [place], [place], [address] Phone: [phone]   Fax:  [phone]  Name: Jackson Terrell MRN: [id number] Date of Birth: [birth year]

## 2020-05-04 ENCOUNTER — Encounter: Payer: Self-pay | Admitting: Occupational Therapy

## 2020-05-04 ENCOUNTER — Encounter: Payer: Self-pay | Admitting: Physical Therapy

## 2020-05-04 ENCOUNTER — Ambulatory Visit: Payer: BC Managed Care – PPO | Admitting: Occupational Therapy

## 2020-05-04 ENCOUNTER — Other Ambulatory Visit: Payer: Self-pay

## 2020-05-04 ENCOUNTER — Ambulatory Visit: Payer: BC Managed Care – PPO

## 2020-05-04 ENCOUNTER — Ambulatory Visit: Payer: BC Managed Care – PPO | Admitting: Physical Therapy

## 2020-05-04 VITALS — BP 162/96

## 2020-05-04 DIAGNOSIS — R278 Other lack of coordination: Secondary | ICD-10-CM

## 2020-05-04 DIAGNOSIS — R2681 Unsteadiness on feet: Secondary | ICD-10-CM

## 2020-05-04 DIAGNOSIS — R2689 Other abnormalities of gait and mobility: Secondary | ICD-10-CM

## 2020-05-04 DIAGNOSIS — R471 Dysarthria and anarthria: Secondary | ICD-10-CM

## 2020-05-04 DIAGNOSIS — M6281 Muscle weakness (generalized): Secondary | ICD-10-CM

## 2020-05-04 NOTE — Therapy (Signed)
[facility] [address] [place], [place], [address] Phone: [phone]   Fax:  [phone]  Speech Language Pathology Treatment  Patient Details  Name: Jackson Terrell MRN: [id number] Date of Birth: February 05, 1991 Referring Provider (SLP): Ihor Austin NP   Encounter Date: 05/04/2020   End of Session - 05/04/20 1722    Visit Number 5    Number of Visits 13    Date for SLP Re-Evaluation 05/25/20    SLP Start Time 1449    SLP Stop Time  1531    SLP Time Calculation (min) 42 min    Activity Tolerance Patient tolerated treatment well           Past Medical History:  Diagnosis Date  . Scoliosis    mild    Past Surgical History:  Procedure Laterality Date  . BUBBLE STUDY  03/18/2020   Procedure: BUBBLE STUDY;  Surgeon: Wendall Stade, MD;  Location: [facility] ENDOSCOPY;  Service: Cardiovascular;;  . TEE WITHOUT CARDIOVERSION N/A 03/18/2020   Procedure: TRANSESOPHAGEAL ECHOCARDIOGRAM (TEE);  Surgeon: Wendall Stade, MD;  Location: [facility] ENDOSCOPY;  Service: Cardiovascular;  Laterality: N/A;    There were no vitals filed for this visit.   Subjective Assessment - 05/04/20 1458    Subjective "Juss a few times."    Currently in Pain? No/denies                 ADULT SLP TREATMENT - 05/04/20 1458      General Information   Behavior/Cognition Cooperative;Alert;Pleasant mood      Treatment Provided   Treatment provided Cognitive-Linquistic      Cognitive-Linquistic Treatment   Treatment focused on Dysarthria;Patient/family/caregiver education    Skilled Treatment SLP assessed pt for oral weakness and SLP did not pick up on any. "I'm' trying to remember there was one word yesterday I was having so much trouble with -  SLUR." SLP practiced this specific word with pt and he produced 3 sentences with the word.  SLP used this opportunity to reiterate the rationale for slowing rate and exaggerating speech movement. Pt  complained of speech  less-precise in the afternoons. Pt asked pt what would be some ways  to compensate and pt thought of 1 that he is not doing and two that he is doing. In short conversation segments (min-mod complex) pt was average 75-80% successful with WNLspeech using compensations. "The biggest issue I have is speaking too quickly. And I hate it." SLP encourgaed pt to cont to practice talking -and instructed him that was the best practice fo rhim at this point.       Assessment / Recommendations / Plan   Plan Continue with current plan of care      Progression Toward Goals   Progression toward goals Progressing toward goals            SLP Education - 05/04/20 1722    Education Details talking is his best practice - slow, exaggerated    Person(s) Educated Patient    Methods Explanation    Comprehension Verbalized understanding              SLP Long Term Goals - 05/04/20 1724      SLP LONG TERM GOAL #1   Title Pt will be independent with HEP for dysarthira    Time 4    Period Weeks    Status On-going      SLP LONG TERM GOAL #2   Title Pt will  be 100% intelligble in noisy environment and while walking outside of clinic with rare min A over 2 sessions    Time 4    Period Weeks    Status On-going      SLP LONG TERM GOAL #3   Title Pt will use compensatory strategies to be 100% intelligible on phone calls with family and business with rare min A    Time 4    Period Weeks    Status On-going      SLP LONG TERM GOAL #4   Title Pt wil limprove score on Communicative Effectiveness Survey by 3 points (original score is 19)    Time 4    Period Weeks    Status On-going            Plan - 05/04/20 1723    Clinical Impression Statement [name] cont to be seen for outpt ST for dysarthria followng a cerebellar CVA. 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 He continues to indicate frustration with his  dysarthria, which is less severe today than last week. He reports fatigue of speech musculature after talking for longer periods, as well as increased slur with fatigue. He is intelligble with slow rate, however upon increasing his rate of speech, intelligilbity reduces to 95% face to face in a quiet room. I  recommend cont'd skilled ST to maximize intelligiblity and achieve WNL speech for return to work with clients and vendors, as well as [employer]. Requested certification for 6 weeks, however I believe he will likely be discharged sooner.    Speech Therapy Frequency 2x / week    Duration --   6 weeks or 13 visits   Treatment/Interventions SLP instruction and feedback;Compensatory strategies;Functional tasks;Language facilitation;Patient/family education;Compensatory techniques;Cueing hierarchy;Environmental controls;Internal/external aids    Potential to Achieve Goals Good    SLP Home Exercise Plan HEP for dysarthria - see pt instructions    Consulted and Agree with Plan of Care Patient           Patient will benefit from skilled therapeutic intervention in order to improve the following deficits and impairments:   Dysarthria and anarthria    Problem List Patient Active Problem List   Diagnosis Date Noted  . 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 Depression 03/15/2020    [facility] ,MS, CCC-SLP  05/04/2020, 5:25 PM  [facility] [address] [place], [place], [address] Phone: [phone]   Fax:  [phone]   Name: Jackson Terrell MRN: [id number] Date of Birth: 1991-05-18

## 2020-05-04 NOTE — Therapy (Signed)
[facility] [address] [place], [place], [address] Phone: [phone]   Fax:  [phone]  Occupational Therapy Treatment  Patient Details  Name: Jackson Terrell MRN: [id number] Date of Birth: 1990-09-12 Referring Provider (OT): Ihor Austin, NP ((pt was referred by hospitalist))   Encounter Date: 05/04/2020   OT End of Session - 05/04/20 1531    Visit Number 9    Number of Visits 17    Date for OT Re-Evaluation 05/26/20    Authorization Type BCBS    Authorization Time Period Week 5/8 (05/04/20)    OT Start Time 1534    OT Stop Time 1614    OT Time Calculation (min) 40 min    Activity Tolerance Patient tolerated treatment well    Behavior During Therapy [facility] for tasks assessed/performed           Past Medical History:  Diagnosis Date  . Scoliosis    mild    Past Surgical History:  Procedure Laterality Date  . BUBBLE STUDY  03/18/2020   Procedure: BUBBLE STUDY;  Surgeon: Wendall Stade, MD;  Location: [facility] ENDOSCOPY;  Service: Cardiovascular;;  . TEE WITHOUT CARDIOVERSION N/A 03/18/2020   Procedure: TRANSESOPHAGEAL ECHOCARDIOGRAM (TEE);  Surgeon: Wendall Stade, MD;  Location: [facility] ENDOSCOPY;  Service: Cardiovascular;  Laterality: N/A;    There were no vitals filed for this visit.   Subjective Assessment - 05/04/20 1531    Subjective  Pt denies any pain. Pt reports he is tired.    Limitations Check Blood Pressure    Patient Stated Goals improve RUE dexterity so he get back to working from home    Currently in Pain? No/denies                        OT Treatments/Exercises (OP) - 05/04/20 1537      ADLs   Cooking pt reports doing more heating up food now that mom has left    Work pt has returned back to work      Technical brewer   Attention Span Alternating small pegboard design and switching to word search every 3 mins with self-monitoring with little difficulty. Pt with  difficulty with maintaining conversations while completing tasks      Visual/Perceptual Exercises   Copy this Image Pegboard    Pegboard no difficulty    Letter Search word search with min difficulty. difficulty with attention and getting distracted with other words and conversation      Fine Motor Coordination (Hand/Wrist)   Fine Motor Coordination Small Pegboard;Grooved pegs    Small Pegboard difficulty with rotating pegs and manipulation    Grooved pegs RUE increased time but no drops                    OT Short Term Goals - 04/26/20 1421      OT SHORT TERM GOAL #1   Title I with HEP-04/29/20    Time 4    Period Weeks    Status On-going    Target Date 04/29/20      OT SHORT TERM GOAL #2   Title Pt will type 30 wpm with at least 85% accuracy.    Time 4    Period Weeks    Status Achieved      OT SHORT TERM GOAL #3   Title Pt will verbalize understanding of adapted strategies for ADLS/IADLS to maximize safety and I prn.  Time 4    Period Weeks    Status Achieved      OT SHORT TERM GOAL #4   Title Pt will demonstrate improved RUE fine motor coordination for ADLs as evidenced by decreasing 9 hole peg test score by 4 secs.    Baseline RUE 43.84 secs. LUE 24.03 secs    Time 4    Period Weeks    Status Achieved      OT SHORT TERM GOAL #5   Title Pt will  demonstrate improved UE functional use for ADLs as evidenced by increasing box/ blocks score by 4 blocks with RUE    Baseline RUE 40, LUE 55    Time 4    Status Achieved             OT Long Term Goals - 04/26/20 1416      OT LONG TERM GOAL #1   Title I with updated HEP.-05/27/20    Time 8    Period Weeks    Status On-going      OT LONG TERM GOAL #2   Title Pt will write a short paragraph with 100% legibility and only min decrease in letter size.    Time 8    Period Weeks    Status Achieved      OT LONG TERM GOAL #3   Title Pt will demonstrate improved fine motor coordination for ADLs as  evidenced by decreasing 9 hole peg test score for RUE by 8 secs from eval    Baseline RUE 43.84 secs. LUE 24.03 secs    Time 8    Period Weeks    Status On-going      OT LONG TERM GOAL #4   Title Pt will  demonstrate improved RUE functional use for ADLs as evidenced by increasing box/ blocks score by 8 blocks    Baseline RUE 40 blocks, LUE 55    Time 8    Period Weeks    Status Achieved      OT LONG TERM GOAL #5   Title Pt will type 35 wpm with 90% or better accuracy in prep for return to work.    Time 8    Period Weeks    Status Achieved   40 wpm 95%     OT LONG TERM GOAL #6   Title Pt will resume prior level of home management / cooking at a modified I level demonstrating good safety awareness.    Time 8    Period Weeks    Status On-going   Pt reports performing laundry, folding it and simple cooking mod I                Plan - 05/04/20 1541    Clinical Impression Statement Pt progressing to higher level attention tasks and moving towards prior level of functioning for home management tasks.    OT Occupational Profile and History Detailed Assessment- Review of Records and additional review of physical, cognitive, psychosocial history related to current functional performance    Occupational performance deficits (Please refer to evaluation for details): ADL's;IADL's;Work;Social Participation;Leisure    Body Structure / Function / Physical Skills ADL;UE functional use;Balance;Flexibility;Pain;FMC;ROM;Gait;GMC;Coordination;Decreased knowledge of precautions;Decreased knowledge of use of DME;IADL;Dexterity;Mobility;Tone;Strength    Rehab Potential Good    Clinical Decision Making Limited treatment options, no task modification necessary    Comorbidities Affecting Occupational Performance: May have comorbidities impacting occupational performance    Modification or Assistance to Complete Evaluation  No modification of tasks or  assist necessary to complete eval    OT Frequency  2x / week   or 16 visits total plus eval   OT Duration 8 weeks    OT Treatment/Interventions Self-care/ADL training;Ultrasound;DME and/or AE instruction;Patient/family education;Paraffin;Passive range of motion;Balance training;Cryotherapy;Fluidtherapy;Splinting;Moist Heat;Therapeutic exercise;Manual Therapy;Therapeutic activities;Neuromuscular education    Plan Higher level cognitive tasks. typing    Consulted and Agree with Plan of Care Patient           Patient will benefit from skilled therapeutic intervention in order to improve the following deficits and impairments:   Body Structure / Function / Physical Skills: ADL, UE functional use, Balance, Flexibility, Pain, FMC, ROM, Gait, GMC, Coordination, Decreased knowledge of precautions, Decreased knowledge of use of DME, IADL, Dexterity, Mobility, Tone, Strength       Visit Diagnosis: Muscle weakness (generalized)  Other lack of coordination  Unsteadiness on feet  Other abnormalities of gait and mobility    Problem List Patient Active Problem List   Diagnosis Date Noted  . 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 Depression 03/15/2020    Junious Dresser MOT, OTR/L  05/04/2020, 4:23 PM  [facility] [address] [place], [place], [address] Phone: [phone]   Fax:  [phone]  Name: Jackson Terrell MRN: [id number] Date of Birth: [birth year]

## 2020-05-05 NOTE — Therapy (Signed)
[facility] [address] [place], [place], [address] Phone: [phone]   Fax:  [phone]  Physical Therapy Treatment  Patient Details  Name: Jackson Terrell MRN: [id number] Date of Birth: [birth year] Referring Provider (PT): Dr. Horris Latino (hospitalist) Frann Rider, NP; Phineas Inches, MD (PCP))   Encounter Date: 05/04/2020   PT End of Session - 05/04/20 1406    Visit Number 10    Number of Visits 13    Authorization Type BCBS    PT Start Time 0093    PT Stop Time 1444    PT Time Calculation (min) 40 min    Equipment Utilized During Treatment Gait belt    Activity Tolerance Treatment limited secondary to medical complications (Comment)   BP measures   Behavior During Therapy [facility] for tasks assessed/performed           Past Medical History:  Diagnosis Date  . Scoliosis    mild    Past Surgical History:  Procedure Laterality Date  . BUBBLE STUDY  03/18/2020   Procedure: BUBBLE STUDY;  Surgeon: Josue Hector, MD;  Location: [place];  Service: Cardiovascular;;  . TEE WITHOUT CARDIOVERSION N/A 03/18/2020   Procedure: TRANSESOPHAGEAL ECHOCARDIOGRAM (TEE);  Surgeon: Josue Hector, MD;  Location: [facility] ENDOSCOPY;  Service: Cardiovascular;  Laterality: N/A;    Vitals:   05/04/20 1419 05/04/20 1424 05/04/20 1430 05/04/20 1441  BP: (!) 160/100 (!) 164/98 (!) 160/94 (!) 162/96     Subjective Assessment - 05/04/20 1405    Subjective No new complants. Has his new arm BP cuff for home use. No falls or pain to report. HEP is going well at home.    Patient Stated Goals Pt's goals for therapy are to improve walking straight and get R arm and hand work better.    Currently in Pain? No/denies    Pain Score 0-No pain                 [facility] Adult PT Treatment/Exercise - 05/04/20 1413      Transfers   Transfers Sit to Stand;Stand to Sit    Sit to Stand 6: Modified independent (Device/Increase time)     Stand to Sit 6: Modified independent (Device/Increase time)      Ambulation/Gait   Ambulation/Gait Yes    Ambulation/Gait Assistance 6: Modified independent (Device/Increase time)    Ambulation Distance (Feet) 1000 Feet    Assistive device None    Gait Pattern Within Functional Limits    Ambulation Surface Level;Unlevel;Indoor;Outdoor;Paved    Gait velocity 8.40 sec's= 3.90 ft/sec no AD               Balance Exercises - 05/04/20 1435      Balance Exercises: Standing   Standing Eyes Closed Narrow base of support (BOS);Wide (BOA);Head turns;Foam/compliant surface;Other reps (comment);30 secs;Limitations    Standing Eyes Closed Limitations on airex with no UE support: feet together for EC 30 sec's x 3 reps, then with feet hip width apart for EC head movements left<>right, up<>down for ~8-10 reps each. min guard assist for balance with mild increase in postural sway noted.                PT Short Term Goals - 04/29/20 1523      PT SHORT TERM GOAL #1   Title Pt will be independent with HEP for improved strength, balance, transfers, and gait.  TARGET 04/22/2020    Baseline Pt has been performing initial HEP  as instructed and walking daily. 04/21/20    Time 4    Period Weeks    Status Achieved      PT SHORT TERM GOAL #2   Title Pt will improve TUG score to less than or equal to 13.5 sec with no device, for decreased fall risk.    Baseline 04/29/20 8 seconds    Time 4    Period Weeks    Status Achieved      PT SHORT TERM GOAL #3   Title Pt will improve SLS to at least 3 seconds each lower extremity for improved functional strength and balance.    Baseline 4 seconds on RLE, 20 s on LLE 04/29/20    Time 4    Period Weeks    Status Achieved      PT SHORT TERM GOAL #4   Title Pt will improve DGI score to at least 16/24 for decreased fall risk.    Baseline 24/24 04/29/20    Time 4    Period Weeks    Status Achieved      PT SHORT TERM GOAL #5   Title Pt will verbalize  understanding of fall prevention education for home environment.    Baseline Pt is using cane when goes out of home for safety but not having to rely on it. He has been instructed to take his time with turns. No falls reported.    Time 4    Period Weeks    Status Achieved             PT Long Term Goals - 05/04/20 1409      PT LONG TERM GOAL #1   Title Pt will be independent with HEP for improved strength, balance, transfers, and gait.  TARGET 05/06/2020    Baseline Pt reports indep with HEP 04/29/20    Status Achieved      PT LONG TERM GOAL #2   Title Pt will improve gait velocity to at least 3 ft/sec for improved gait efficiency and safety in community.    Baseline 05/04/20: 3.90 ft/sec no AD    Time --    Period --    Status Achieved      PT LONG TERM GOAL #3   Title Pt will improve DGI score to at least 19/24 for decreased fall risk.    Baseline 24/24 04/29/20    Status Achieved      PT LONG TERM GOAL #4   Title Pt will ambulate at least 1000 ft, indoor and outdoor surfaces, no device, independently, for safe return to independent community giat.    Baseline 05/04/20: met in session today    Time --    Period --    Status Achieved      PT LONG TERM GOAL #5   Title Pt will report improvement in FOTO score at d/c by at least 20% for improved overall functional mobility.    Baseline 47.37%    Time 6    Period Weeks    Status On-going      PT LONG TERM GOAL #6   Title Pt will improve score on FGA to 30/30 for improved balance with functional mobility    Baseline 27/30 04/29/20    Time 6    Period Weeks    Status On-going                 Plan - 05/04/20 1406    Clinical Impression Statement Today's skilled session began to  check progress toward LTGs due on 05/06/20. Treatments and ability to assess goals have been limited in prior and today's sessions due to elevated BP's. Pt did meet LTGs 2 and 4 this session, had met 1 and 3 in prior sessions.    Personal  Factors and Comorbidities Other;Comorbidity 1   HTN (BP elevated during PT session; almost to parameter unable to treat)   Comorbidities Hx of depression    Examination-Activity Limitations Locomotion Level;Stairs;Stand    Examination-Participation Restrictions Occupation;Community Activity    Stability/Clinical Decision Making Stable/Uncomplicated    Rehab Potential Good    PT Frequency 2x / week    PT Duration 6 weeks   plus eval   PT Treatment/Interventions ADLs/Self Care Home Management;DME Instruction;Gait training;Stair training;Functional mobility training;Therapeutic activities;Therapeutic exercise;Balance training;Neuromuscular re-education;Patient/family education;Vestibular    PT Next Visit Plan monitor BP. remaining LTGs due 05/06/20.    Consulted and Agree with Plan of Care Patient           Patient will benefit from skilled therapeutic intervention in order to improve the following deficits and impairments:  Abnormal gait, Difficulty walking, Dizziness, Decreased balance, Decreased mobility, Decreased strength  Visit Diagnosis: Muscle weakness (generalized)  Unsteadiness on feet  Other abnormalities of gait and mobility     Problem List Patient Active Problem List   Diagnosis Date Noted  . 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 Depression 03/15/2020      Willow Ora, PTA, [place] [address], [place], [place] [address] [phone] 05/05/20, 7:39 PM   Name: Jackson Terrell MRN: [id number] Date of Birth: [birth year]

## 2020-05-06 ENCOUNTER — Ambulatory Visit: Payer: BC Managed Care – PPO | Admitting: Occupational Therapy

## 2020-05-06 ENCOUNTER — Ambulatory Visit: Payer: BC Managed Care – PPO | Admitting: Speech Pathology

## 2020-05-06 ENCOUNTER — Encounter: Payer: Self-pay | Admitting: Occupational Therapy

## 2020-05-06 ENCOUNTER — Telehealth: Payer: Self-pay

## 2020-05-06 ENCOUNTER — Ambulatory Visit: Payer: BC Managed Care – PPO

## 2020-05-06 ENCOUNTER — Encounter: Payer: Self-pay | Admitting: Speech Pathology

## 2020-05-06 ENCOUNTER — Other Ambulatory Visit: Payer: Self-pay

## 2020-05-06 VITALS — BP 148/90

## 2020-05-06 DIAGNOSIS — R2689 Other abnormalities of gait and mobility: Secondary | ICD-10-CM

## 2020-05-06 DIAGNOSIS — R471 Dysarthria and anarthria: Secondary | ICD-10-CM

## 2020-05-06 DIAGNOSIS — R2681 Unsteadiness on feet: Secondary | ICD-10-CM

## 2020-05-06 DIAGNOSIS — M6281 Muscle weakness (generalized): Secondary | ICD-10-CM | POA: Diagnosis not present

## 2020-05-06 DIAGNOSIS — R278 Other lack of coordination: Secondary | ICD-10-CM

## 2020-05-06 NOTE — Therapy (Signed)
[facility] [address] [place], [place], [address] Phone: [phone]   Fax:  [phone]  Occupational Therapy Treatment  Patient Details  Name: Jackson Terrell MRN: [id number] Date of Birth: 30-Nov-1990 Referring Provider (OT): Frann Rider, NP ((pt was referred by hospitalist))   Encounter Date: 05/06/2020   OT End of Session - 05/06/20 1534    Visit Number 10    Number of Visits 17    Date for OT Re-Evaluation 05/26/20    Authorization Type BCBS    Authorization Time Period Week 5/8 (05/04/20)    OT Start Time 1532    OT Stop Time 1615    OT Time Calculation (min) 43 min    Activity Tolerance Patient tolerated treatment well    Behavior During Therapy [facility] for tasks assessed/performed           Past Medical History:  Diagnosis Date  . Scoliosis    mild    Past Surgical History:  Procedure Laterality Date  . BUBBLE STUDY  03/18/2020   Procedure: BUBBLE STUDY;  Surgeon: Josue Hector, MD;  Location: [place];  Service: Cardiovascular;;  . TEE WITHOUT CARDIOVERSION N/A 03/18/2020   Procedure: TRANSESOPHAGEAL ECHOCARDIOGRAM (TEE);  Surgeon: Josue Hector, MD;  Location: [facility] ENDOSCOPY;  Service: Cardiovascular;  Laterality: N/A;    There were no vitals filed for this visit.   Subjective Assessment - 05/06/20 1535    Subjective  "I finished with physical therapy today". Pt denies any pain and says "everything seems to be going well".    Limitations Check Blood Pressure    Patient Stated Goals improve RUE dexterity so he get back to working from home    Currently in Pain? No/denies                        OT Treatments/Exercises (OP) - 05/06/20 1538      Exercises   Exercises --   Brewing technologist with RUE for strengthening and coordination     Cognitive Exercises   Sequencing Skills sequence cards 8 card - no errors    Attention Span Alternating small pegboard design, 24 pc  puzzle and alphabetizing words - switching tasks every 2 minutes. pt self-monitored with switching and demonstrated little to no difficulty    Handwriting with alphabetizing words with 90% legibility and increased time      Visual/Perceptual Exercises   Copy this Image Pegboard    Pegboard no errors. no drops    Visual Motor Integration 24 pc puzzle   no errors and no drops. appropriate time     Fine Motor Coordination (Hand/Wrist)   Fine Motor Coordination Small Pegboard;Grooved pegs    Grooved pegs with RUE with increased speed and coordination. Continues to have some delay with rotating pegs but overall improved                    OT Short Term Goals - 05/06/20 1540      OT SHORT TERM GOAL #1   Title I with HEP-04/29/20    Time 4    Period Weeks    Status Achieved    Target Date 04/29/20      OT SHORT TERM GOAL #2   Title Pt will type 30 wpm with at least 85% accuracy.    Time 4    Period Weeks    Status Achieved      OT SHORT TERM GOAL #3  Title Pt will verbalize understanding of adapted strategies for ADLS/IADLS to maximize safety and I prn.    Time 4    Period Weeks    Status Achieved      OT SHORT TERM GOAL #4   Title Pt will demonstrate improved RUE fine motor coordination for ADLs as evidenced by decreasing 9 hole peg test score by 4 secs.    Baseline RUE 43.84 secs. LUE 24.03 secs    Time 4    Period Weeks    Status Achieved      OT SHORT TERM GOAL #5   Title Pt will  demonstrate improved UE functional use for ADLs as evidenced by increasing box/ blocks score by 4 blocks with RUE    Baseline RUE 40, LUE 55    Time 4    Status Achieved             OT Long Term Goals - 05/06/20 1540      OT LONG TERM GOAL #1   Title I with updated HEP.-05/27/20    Time 8    Period Weeks    Status On-going      OT LONG TERM GOAL #2   Title Pt will write a short paragraph with 100% legibility and only min decrease in letter size.    Time 8    Period  Weeks    Status Achieved      OT LONG TERM GOAL #3   Title Pt will demonstrate improved fine motor coordination for ADLs as evidenced by decreasing 9 hole peg test score for RUE by 8 secs from eval    Baseline RUE 43.84 secs. LUE 24.03 secs    Time 8    Period Weeks    Status On-going      OT LONG TERM GOAL #4   Title Pt will  demonstrate improved RUE functional use for ADLs as evidenced by increasing box/ blocks score by 8 blocks    Baseline RUE 40 blocks, LUE 55    Time 8    Period Weeks    Status Achieved      OT LONG TERM GOAL #5   Title Pt will type 35 wpm with 90% or better accuracy in prep for return to work.    Time 8    Period Weeks    Status Achieved   40 wpm 95%     OT LONG TERM GOAL #6   Title Pt will resume prior level of home management / cooking at a modified I level demonstrating good safety awareness.    Time 8    Period Weeks    Status Achieved   pt reports met                Plan - 05/06/20 1539    Clinical Impression Statement Pt is doing well and is making good progress towards goals. Continuing to improve and benefit from skilled OT.    OT Occupational Profile and History Detailed Assessment- Review of Records and additional review of physical, cognitive, psychosocial history related to current functional performance    Occupational performance deficits (Please refer to evaluation for details): ADL's;IADL's;Work;Social Participation;Leisure    Body Structure / Function / Physical Skills ADL;UE functional use;Balance;Flexibility;Pain;FMC;ROM;Gait;GMC;Coordination;Decreased knowledge of precautions;Decreased knowledge of use of DME;IADL;Dexterity;Mobility;Tone;Strength    Rehab Potential Good    Clinical Decision Making Limited treatment options, no task modification necessary    Comorbidities Affecting Occupational Performance: May have comorbidities impacting occupational performance  Modification or Assistance to Complete Evaluation  No  modification of tasks or assist necessary to complete eval    OT Frequency 2x / week   or 16 visits total plus eval   OT Duration 8 weeks    OT Treatment/Interventions Self-care/ADL training;Ultrasound;DME and/or AE instruction;Patient/family education;Paraffin;Passive range of motion;Balance training;Cryotherapy;Fluidtherapy;Splinting;Moist Heat;Therapeutic exercise;Manual Therapy;Therapeutic activities;Neuromuscular education    Plan typing, prepare for discharge    Consulted and Agree with Plan of Care Patient           Patient will benefit from skilled therapeutic intervention in order to improve the following deficits and impairments:   Body Structure / Function / Physical Skills: ADL, UE functional use, Balance, Flexibility, Pain, FMC, ROM, Gait, GMC, Coordination, Decreased knowledge of precautions, Decreased knowledge of use of DME, IADL, Dexterity, Mobility, Tone, Strength       Visit Diagnosis: Muscle weakness (generalized)  Unsteadiness on feet  Other abnormalities of gait and mobility  Other lack of coordination    Problem List Patient Active Problem List   Diagnosis Date Noted  . 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 Depression 03/15/2020    Zachery Conch MOT, OTR/L  05/06/2020, 4:09 PM  [facility] [address] [place], [place], [address] Phone: [phone]   Fax:  [phone]  Name: Jackson Terrell MRN: [id number] Date of Birth: 1990/08/04

## 2020-05-06 NOTE — Telephone Encounter (Signed)
Shanda Bumps, PT discharged [facility] today as he is doing well on our end. I do continue to have concerns about his elevated BP. He was started on a BP med last week by PCP, Dr. Everlene Other, and will be returning in 3 weeks. BP still running 160/100 to 148/100 with any activity. Wanted to keep you informed as well. Thank you. Elmer Bales, PT, DPT, NCS

## 2020-05-06 NOTE — Therapy (Signed)
[facility] [address] [place], [place], [address] Phone: [phone]   Fax:  [phone]  Speech Language Pathology Treatment  Patient Details  Name: CURTEZ BRALLIER MRN: [id number] Date of Birth: 14-Feb-1991 Referring Provider (SLP): Ihor Austin NP   Encounter Date: 05/06/2020   End of Session - 05/06/20 1515    Visit Number 6    Number of Visits 13    Date for SLP Re-Evaluation 05/25/20    SLP Start Time 1450    SLP Stop Time  1529    SLP Time Calculation (min) 39 min    Activity Tolerance Treatment limited secondary to medical complications (Comment)           Past Medical History:  Diagnosis Date  . Scoliosis    mild    Past Surgical History:  Procedure Laterality Date  . BUBBLE STUDY  03/18/2020   Procedure: BUBBLE STUDY;  Surgeon: Wendall Stade, MD;  Location: [facility] ENDOSCOPY;  Service: Cardiovascular;;  . TEE WITHOUT CARDIOVERSION N/A 03/18/2020   Procedure: TRANSESOPHAGEAL ECHOCARDIOGRAM (TEE);  Surgeon: Wendall Stade, MD;  Location: [facility] ENDOSCOPY;  Service: Cardiovascular;  Laterality: N/A;    There were no vitals filed for this visit.   Subjective Assessment - 05/06/20 1453    Subjective "It's getting better"    Currently in Pain? 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 In structured sentence generation task  Trudie Buckler demonstrated WNL articulation. In conversation [place] maintained WNL articulation with supervision cues and self corrected errors with mod IGaynell Face was 100% intelligible walking outside in noisy environment with  supervision cues.       Assessment / Recommendations / Plan   Plan Continue with current plan of care      Progression Toward Goals   Progression toward goals Progressing toward goals            SLP Education - 05/06/20 1511    Education Details Continue practicing in conversation slow rate and over articulation    Person(s) Educated Patient    Methods Explanation    Comprehension Verbalized understanding              SLP Long Term Goals - 05/06/20 1514      SLP LONG TERM GOAL #1   Title Pt will be independent with HEP for dysarthira    Time 4    Period Weeks    Status On-going      SLP LONG TERM GOAL #2   Title Pt will be 100% intelligble in noisy environment and while walking outside of clinic with rare min A over 2 sessions    Time 4    Period Weeks    Status On-going      SLP LONG TERM GOAL #3   Title Pt will use compensatory strategies to be 100% intelligible on phone calls with family and business with rare min A    Time 4    Period Weeks    Status On-going      SLP LONG TERM GOAL #4   Title Pt wil limprove score on Communicative  Effectiveness Survey by 3 points (original score is 19)    Time 4    Period Weeks    Status On-going            Plan - 05/06/20 1511    Clinical Impression Statement Slight dysarthria persists with fatigue or excitement. Leshaun reports success communicating at work and with friends. Continue skilled ST to return to WNL articulation, likely 1-3 more sessions    Speech Therapy Frequency 2x / week    Duration --   6 weeks or 13 visits   Treatment/Interventions SLP instruction and feedback;Compensatory strategies;Functional tasks;Language facilitation;Patient/family education;Compensatory techniques;Cueing hierarchy;Environmental controls;Internal/external aids    Potential to Achieve Goals Good           Patient will benefit from skilled therapeutic intervention in order to improve the following deficits and impairments:    Dysarthria and anarthria    Problem List Patient Active Problem List   Diagnosis Date Noted  . 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 Depression 03/15/2020    Seeley Southgate, Radene Journey MS, CCC-SLP 05/06/2020, 3:31 PM  [facility] [address] [place], [place], [address] Phone: [phone]   Fax:  [phone]   Name: SAMVEL ZINN MRN: [id number] Date of Birth: [birth year]

## 2020-05-06 NOTE — Therapy (Signed)
[facility] [address] [place], [place], [address] Phone: [phone]   Fax:  [phone]  Physical Therapy Treatment/Discharge Summary  Patient Details  Name: Jackson Terrell MRN: [id number] Date of Birth: 09/17/1990 Referring Provider (PT): Dr. Ezenduka (hospitalist) (Jessica McCue, NP; David E. Bouska, MD (PCP))  PHYSICAL THERAPY DISCHARGE SUMMARY  Visits from Start of Care: 11  Current functional level related to goals / functional outcomes: See clinical impression and goals for more information. Pt is ambulating without AD independently. No longer a fall risk.   Remaining deficits: Speech and still seeing ST.   Education / Equipment: HEP  Plan: Patient agrees to discharge.  Patient goals were met. Patient is being discharged due to meeting the stated rehab goals.  ?????       Encounter Date: 05/06/2020   PT End of Session - 05/06/20 1405    Visit Number 11    Number of Visits 13    Authorization Type BCBS    PT Start Time 1403    PT Stop Time 1434   discharge visit so finished early. Also high BP   PT Time Calculation (min) 31 min    Equipment Utilized During Treatment Gait belt    Activity Tolerance Treatment limited secondary to medical complications (Comment)   BP measures   Behavior During Therapy WFL for tasks assessed/performed           Past Medical History:  Diagnosis Date  . Scoliosis    mild    Past Surgical History:  Procedure Laterality Date  . BUBBLE STUDY  03/18/2020   Procedure: BUBBLE STUDY;  Surgeon: Nishan, Peter C, MD;  Location: [facility] ENDOSCOPY;  Service: Cardiovascular;;  . TEE WITHOUT CARDIOVERSION N/A 03/18/2020   Procedure: TRANSESOPHAGEAL ECHOCARDIOGRAM (TEE);  Surgeon: Nishan, Peter C, MD;  Location: [facility] ENDOSCOPY;  Service: Cardiovascular;  Laterality: N/A;    Vitals:   05/06/20 1406  BP: (!) 148/90     Subjective Assessment - 05/06/20 1405    Subjective Pt  reports that he has been doing well walking daily. Has returned to work full time and reports that energy level is improving. Just a little tired after. Feels he is doing much better. Pt returns to PCP in 3 weeks.    Patient Stated Goals Pt's goals for therapy are to improve walking straight and get R arm and hand work better.    Currently in Pain? No/denies              [facility] PT Assessment - 05/06/20 1411      Functional Gait  Assessment   Gait assessed  Yes    Gait Level Surface Walks 20 ft in less than 5.5 sec, no assistive devices, good speed, no evidence for imbalance, normal gait pattern, deviates no more than 6 in outside of the 12 in walkway width.    Change in Gait Speed Able to smoothly change walking speed without loss of balance or gait deviation. Deviate no more than 6 in outside of the 12 in walkway width.    Gait with Horizontal Head Turns Performs head turns smoothly with no change in gait. Deviates no more than 6 in outside 12 in walkway width    Gait with Vertical Head Turns Performs head turns with no change in gait. Deviates no more than 6 in outside 12 in walkway width.    Gait and Pivot Turn Pivot turns safely within 3 sec and stops quickly with no loss   of balance.    Step Over Obstacle Is able to step over 2 stacked shoe boxes taped together (9 in total height) without changing gait speed. No evidence of imbalance.    Gait with Narrow Base of Support Is able to ambulate for 10 steps heel to toe with no staggering.    Gait with Eyes Closed Walks 20 ft, uses assistive device, slower speed, mild gait deviations, deviates 6-10 in outside 12 in walkway width. Ambulates 20 ft in less than 9 sec but greater than 7 sec.    Ambulating Backwards Walks 20 ft, no assistive devices, good speed, no evidence for imbalance, normal gait    Steps Alternating feet, no rail.    Total Score 29    FGA comment: Pt no longer a fall risk.  BP reassessed after and was 148/100. BP back down to  142/90 after sitting for 5 minutes.                         [facility] Adult PT Treatment/Exercise - 05/06/20 1411      Ambulation/Gait   Ambulation/Gait Yes    Ambulation/Gait Assistance 7: Independent    Ambulation/Gait Assistance Details around in clinic during session    Assistive device None    Gait Pattern Step-through pattern                  PT Education - 05/06/20 1949    Education Details Pt wsa instructed in plan to discharge today as all goals mostly met except walking with eyes closed on FGA. Pt was educated on importance of continuing to monitor BP especially with activity as continues to have higher BP especially diastolic. Pt to follow-up with PCP in 3 weeks if not sooner if continues to be higher.    Person(s) Educated Patient    Methods Explanation    Comprehension Verbalized understanding            PT Short Term Goals - 04/29/20 1523      PT SHORT TERM GOAL #1   Title Pt will be independent with HEP for improved strength, balance, transfers, and gait.  TARGET 04/22/2020    Baseline Pt has been performing initial HEP as instructed and walking daily. 04/21/20    Time 4    Period Weeks    Status Achieved      PT SHORT TERM GOAL #2   Title Pt will improve TUG score to less than or equal to 13.5 sec with no device, for decreased fall risk.    Baseline 04/29/20 8 seconds    Time 4    Period Weeks    Status Achieved      PT SHORT TERM GOAL #3   Title Pt will improve SLS to at least 3 seconds each lower extremity for improved functional strength and balance.    Baseline 4 seconds on RLE, 20 s on LLE 04/29/20    Time 4    Period Weeks    Status Achieved      PT SHORT TERM GOAL #4   Title Pt will improve DGI score to at least 16/24 for decreased fall risk.    Baseline 24/24 04/29/20    Time 4    Period Weeks    Status Achieved      PT SHORT TERM GOAL #5   Title Pt will verbalize understanding of fall prevention education for home  environment.    Baseline Pt is using cane when  goes out of home for safety but not having to rely on it. He has been instructed to take his time with turns. No falls reported.    Time 4    Period Weeks    Status Achieved             PT Long Term Goals - 05/06/20 1423      PT LONG TERM GOAL #1   Title Pt will be independent with HEP for improved strength, balance, transfers, and gait.  TARGET 05/06/2020    Baseline Pt reports indep with HEP 04/29/20    Status Achieved      PT LONG TERM GOAL #2   Title Pt will improve gait velocity to at least 3 ft/sec for improved gait efficiency and safety in community.    Baseline 05/04/20: 3.90 ft/sec no AD    Status Achieved      PT LONG TERM GOAL #3   Title Pt will improve DGI score to at least 19/24 for decreased fall risk.    Baseline 24/24 04/29/20    Status Achieved      PT LONG TERM GOAL #4   Title Pt will ambulate at least 1000 ft, indoor and outdoor surfaces, no device, independently, for safe return to independent community giat.    Baseline 05/04/20: met in session today    Status Achieved      PT LONG TERM GOAL #5   Title Pt will report improvement in FOTO score at d/c by at least 20% for improved overall functional mobility.    Baseline 47.37%. 05/06/20 FOTO increased to 97 at discharge.    Time 6    Period Weeks    Status Achieved      PT LONG TERM GOAL #6   Title Pt will improve score on FGA to 30/30 for improved balance with functional mobility    Baseline 27/30 04/29/20. 05/06/20 29/30    Time 6    Period Weeks    Status Partially Met                 Plan - 05/06/20 1951    Clinical Impression Statement Pt has met all goals except FGA only missing one point with some veering with gait with eyes closed for score of 29/30. Pt is no longer at fall risk. He is able to ambulate independently with non LOB on varied surfaces. Pt does not report any more issues with balance and gait at home or work as well. Pt does  continue to have elevated BP which is is following with PCP about. Was started on BP med last week but still running high especially diastolic and with activity. PT is discharging at this time.    Personal Factors and Comorbidities Other;Comorbidity 1   HTN (BP elevated during PT session; almost to parameter unable to treat)   Comorbidities Hx of depression    Examination-Activity Limitations Locomotion Level;Stairs;Stand    Examination-Participation Restrictions Occupation;Community Activity    Stability/Clinical Decision Making Stable/Uncomplicated    Rehab Potential Good    PT Frequency 2x / week    PT Duration 6 weeks   plus eval   PT Treatment/Interventions ADLs/Self Care Home Management;DME Instruction;Gait training;Stair training;Functional mobility training;Therapeutic activities;Therapeutic exercise;Balance training;Neuromuscular re-education;Patient/family education;Vestibular    PT Next Visit Plan Discharge today    Consulted and Agree with Plan of Care Patient           Patient will benefit from skilled therapeutic intervention in order to improve the   following deficits and impairments:  Abnormal gait, Difficulty walking, Dizziness, Decreased balance, Decreased mobility, Decreased strength  Visit Diagnosis: Other abnormalities of gait and mobility  Muscle weakness (generalized)     Problem List Patient Active Problem List   Diagnosis Date Noted  . 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 Depression 03/15/2020    Electa Sniff, PT, DPT, NCS 05/06/2020, 7:58 PM  [facility] [address] [place], [place], [address] Phone: [phone]   Fax:  [phone]  Name: Jackson Terrell MRN: [id number] Date of Birth: [birth year]

## 2020-05-09 ENCOUNTER — Other Ambulatory Visit: Payer: Self-pay

## 2020-05-09 ENCOUNTER — Encounter: Payer: Self-pay | Admitting: Occupational Therapy

## 2020-05-09 ENCOUNTER — Ambulatory Visit: Payer: BC Managed Care – PPO | Admitting: Occupational Therapy

## 2020-05-09 ENCOUNTER — Ambulatory Visit: Payer: BC Managed Care – PPO | Admitting: Physical Therapy

## 2020-05-09 ENCOUNTER — Ambulatory Visit: Payer: BC Managed Care – PPO

## 2020-05-09 VITALS — BP 125/90 | HR 80

## 2020-05-09 DIAGNOSIS — M6281 Muscle weakness (generalized): Secondary | ICD-10-CM

## 2020-05-09 DIAGNOSIS — R471 Dysarthria and anarthria: Secondary | ICD-10-CM

## 2020-05-09 DIAGNOSIS — R278 Other lack of coordination: Secondary | ICD-10-CM

## 2020-05-09 NOTE — Telephone Encounter (Signed)
Thank you  for update

## 2020-05-09 NOTE — Therapy (Signed)
[facility] [address] [place], [place], [address] Phone: [phone]   Fax:  [phone]  Speech Language Pathology Treatment  Patient Details  Name: Jackson Terrell MRN: [id number] Date of Birth: [birth year] Referring Provider (SLP): Ihor Austin NP   Encounter Date: 05/09/2020   End of Session - 05/09/20 1508    Visit Number 7    Number of Visits 13    Date for SLP Re-Evaluation 05/25/20    SLP Start Time 1405    SLP Stop Time  1445    SLP Time Calculation (min) 40 min    Activity Tolerance Treatment limited secondary to medical complications (Comment)           Past Medical History:  Diagnosis Date  . Scoliosis    mild    Past Surgical History:  Procedure Laterality Date  . BUBBLE STUDY  03/18/2020   Procedure: BUBBLE STUDY;  Surgeon: Wendall Stade, MD;  Location: [facility] ENDOSCOPY;  Service: Cardiovascular;;  . TEE WITHOUT CARDIOVERSION N/A 03/18/2020   Procedure: TRANSESOPHAGEAL ECHOCARDIOGRAM (TEE);  Surgeon: Wendall Stade, MD;  Location: [facility] ENDOSCOPY;  Service: Cardiovascular;  Laterality: N/A;    There were no vitals filed for this visit.          ADULT SLP TREATMENT - 05/09/20 1453      General Information   Behavior/Cognition Cooperative;Alert;Pleasant mood      Treatment Provided   Treatment provided Cognitive-Linquistic      Cognitive-Linquistic Treatment   Treatment focused on Dysarthria;Patient/family/caregiver education    Skilled Treatment [name] reports having success talking to vendors and co-workers, and rarely receiving a request for repeats. He completed his HEP with standby assist. In conversation (mod complex) [name] maintained WNL articulation with supervision cues and self corrected errors with mod IGaynell Face remained 100% intelligible walking outside in noisy environment with supervision cues. He agreed next session would be his last due to progress and his  comfort level completing practice tasks on his own. "You all have given me the tools, I'm just needing to use them" pt stated.       Assessment / Recommendations / Plan   Plan --   d/c next session     Progression Toward Goals   Progression toward goals Progressing toward goals                SLP Long Term Goals - 05/09/20 1434      SLP LONG TERM GOAL #1   Title Pt will be independent with HEP for dysarthira    Status Achieved      SLP LONG TERM GOAL #2   Title Pt will be 100% intelligble in noisy environment and while walking outside of clinic with rare min A over 2 sessions    Baseline 05-09-20    Time 3    Period Weeks    Status On-going      SLP LONG TERM GOAL #3   Title Pt will use compensatory strategies to be 100% intelligible on phone calls with family and business with rare min A    Status Achieved      SLP LONG TERM GOAL #4   Title Pt wil limprove score on Communicative Effectiveness Survey by 3 points (original score is 19)    Time 3    Period Weeks    Status On-going            Plan - 05/09/20 1508    Clinical Impression Statement  Slight dysarthria persists with fatigue or excitement. [name] reports success communicating at work and with friends. Continue skilled ST to return to WNL articulation, d/c agreed upon for next session.    Speech Therapy Frequency 2x / week    Duration --   6 weeks or 13 visits   Treatment/Interventions SLP instruction and feedback;Compensatory strategies;Functional tasks;Language facilitation;Patient/family education;Compensatory techniques;Cueing hierarchy;Environmental controls;Internal/external aids    Potential to Achieve Goals Good           Patient will benefit from skilled therapeutic intervention in order to improve the following deficits and impairments:   Dysarthria and anarthria    Problem List Patient Active Problem List   Diagnosis Date Noted  . Cerebral embolism with cerebral infarction 03/17/2020    . 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 Depression 03/15/2020    [facility] ,MS, CCC-SLP  05/09/2020, 3:10 PM  [facility] [address] [place], [place], [address] Phone: [phone]   Fax:  [phone]   Name: Jackson Terrell MRN: [id number] Date of Birth: 10/13/1990

## 2020-05-09 NOTE — Therapy (Signed)
[facility] [address] [place], [place], [address] Phone: [phone]   Fax:  [phone]  Occupational Therapy Treatment  Patient Details  Name: Jackson Terrell MRN: [id number] Date of Birth: 1991-04-05 Referring Provider (OT): Frann Rider, NP ((pt was referred by hospitalist))   Encounter Date: 05/09/2020   OT End of Session - 05/09/20 1319    Visit Number 11    Number of Visits 17    Date for OT Re-Evaluation 05/26/20    Authorization Type BCBS    Authorization Time Period Week 6/8 (05/09/20)    OT Start Time 1318    OT Stop Time 1356    OT Time Calculation (min) 38 min    Activity Tolerance Patient tolerated treatment well    Behavior During Therapy [facility] for tasks assessed/performed           Past Medical History:  Diagnosis Date  . Scoliosis    mild    Past Surgical History:  Procedure Laterality Date  . BUBBLE STUDY  03/18/2020   Procedure: BUBBLE STUDY;  Surgeon: Josue Hector, MD;  Location: [place];  Service: Cardiovascular;;  . TEE WITHOUT CARDIOVERSION N/A 03/18/2020   Procedure: TRANSESOPHAGEAL ECHOCARDIOGRAM (TEE);  Surgeon: Josue Hector, MD;  Location: [facility] ENDOSCOPY;  Service: Cardiovascular;  Laterality: N/A;    Vitals:   05/09/20 1349  BP: 125/90  Pulse: 80     Subjective Assessment - 05/09/20 1319    Subjective  "i did a lot of cleaning and organizing this weekend" - Pt denies any pain.    Limitations Check Blood Pressure    Patient Stated Goals improve RUE dexterity so he get back to working from home    Currently in Pain? No/denies                        OT Treatments/Exercises (OP) - 05/09/20 1322      Fine Motor Coordination (Hand/Wrist)   Fine Motor Coordination Handwriting    Manipulation of small objects 9 hole peg test RUE 36.37 seconds    Handwriting distal finger control with maintaining in the donut with pen. word search with writing words  as finding them to increase legibility and accuracy with handwriting. Pt wrote 9 sentences with words and requiring increased time and with good size. Pt reports current handwriting at approximately 80% to prior handwriting                    OT Short Term Goals - 05/06/20 1540      OT SHORT TERM GOAL #1   Title I with HEP-04/29/20    Time 4    Period Weeks    Status Achieved    Target Date 04/29/20      OT SHORT TERM GOAL #2   Title Pt will type 30 wpm with at least 85% accuracy.    Time 4    Period Weeks    Status Achieved      OT SHORT TERM GOAL #3   Title Pt will verbalize understanding of adapted strategies for ADLS/IADLS to maximize safety and I prn.    Time 4    Period Weeks    Status Achieved      OT SHORT TERM GOAL #4   Title Pt will demonstrate improved RUE fine motor coordination for ADLs as evidenced by decreasing 9 hole peg test score by 4 secs.    Baseline RUE 43.84 secs.  LUE 24.03 secs    Time 4    Period Weeks    Status Achieved      OT SHORT TERM GOAL #5   Title Pt will  demonstrate improved UE functional use for ADLs as evidenced by increasing box/ blocks score by 4 blocks with RUE    Baseline RUE 40, LUE 55    Time 4    Status Achieved             OT Long Term Goals - 05/09/20 1323      OT LONG TERM GOAL #1   Title I with updated HEP.-05/27/20    Time 8    Period Weeks    Status On-going      OT LONG TERM GOAL #2   Title Pt will write a short paragraph with 100% legibility and only min decrease in letter size.    Time 8    Period Weeks    Status Achieved      OT LONG TERM GOAL #3   Title Pt will demonstrate improved fine motor coordination for ADLs as evidenced by decreasing 9 hole peg test score for RUE by 8 secs from eval    Baseline RUE 43.84 secs. LUE 24.03 secs    Time 8    Period Weeks    Status Achieved   RUE 30 seconds     OT LONG TERM GOAL #4   Title Pt will  demonstrate improved RUE functional use for ADLs as  evidenced by increasing box/ blocks score by 8 blocks    Baseline RUE 40 blocks, LUE 55    Time 8    Period Weeks    Status Achieved      OT LONG TERM GOAL #5   Title Pt will type 35 wpm with 90% or better accuracy in prep for return to work.    Time 8    Period Weeks    Status Achieved   40 wpm 95%     OT LONG TERM GOAL #6   Title Pt will resume prior level of home management / cooking at a modified I level demonstrating good safety awareness.    Time 8    Period Weeks    Status Achieved   pt reports met                Plan - 05/09/20 1325    Clinical Impression Statement Pt has met all STGs and LTGs with exception of updated HEP for independence. Pt has made great improvement with coordination and back to work cognition skills.    OT Occupational Profile and History Detailed Assessment- Review of Records and additional review of physical, cognitive, psychosocial history related to current functional performance    Occupational performance deficits (Please refer to evaluation for details): ADL's;IADL's;Work;Social Participation;Leisure    Body Structure / Function / Physical Skills ADL;UE functional use;Balance;Flexibility;Pain;FMC;ROM;Gait;GMC;Coordination;Decreased knowledge of precautions;Decreased knowledge of use of DME;IADL;Dexterity;Mobility;Tone;Strength    Rehab Potential Good    Clinical Decision Making Limited treatment options, no task modification necessary    Comorbidities Affecting Occupational Performance: May have comorbidities impacting occupational performance    Modification or Assistance to Complete Evaluation  No modification of tasks or assist necessary to complete eval    OT Frequency 2x / week   or 16 visits total plus eval   OT Duration 8 weeks    OT Treatment/Interventions Self-care/ADL training;Ultrasound;DME and/or AE instruction;Patient/family education;Paraffin;Passive range of motion;Balance training;Cryotherapy;Fluidtherapy;Splinting;Moist  Heat;Therapeutic exercise;Manual Therapy;Therapeutic activities;Neuromuscular education  Plan review HEP and discharge.    Consulted and Agree with Plan of Care Patient           Patient will benefit from skilled therapeutic intervention in order to improve the following deficits and impairments:   Body Structure / Function / Physical Skills: ADL, UE functional use, Balance, Flexibility, Pain, FMC, ROM, Gait, GMC, Coordination, Decreased knowledge of precautions, Decreased knowledge of use of DME, IADL, Dexterity, Mobility, Tone, Strength       Visit Diagnosis: Muscle weakness (generalized)  Other lack of coordination    Problem List Patient Active Problem List   Diagnosis Date Noted  . 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 Depression 03/15/2020    Zachery Conch MOT, OTR/L  05/09/2020, 2:06 PM  [facility] [address] [place], [place], [address] Phone: [phone]   Fax:  [phone]  Name: Jackson Terrell MRN: [id number] Date of Birth: 08/03/1990

## 2020-05-11 ENCOUNTER — Encounter: Payer: Self-pay | Admitting: Occupational Therapy

## 2020-05-11 ENCOUNTER — Other Ambulatory Visit: Payer: Self-pay

## 2020-05-11 ENCOUNTER — Ambulatory Visit: Payer: BC Managed Care – PPO

## 2020-05-11 ENCOUNTER — Ambulatory Visit: Payer: BC Managed Care – PPO | Admitting: Occupational Therapy

## 2020-05-11 DIAGNOSIS — M6281 Muscle weakness (generalized): Secondary | ICD-10-CM | POA: Diagnosis not present

## 2020-05-11 DIAGNOSIS — R278 Other lack of coordination: Secondary | ICD-10-CM

## 2020-05-11 DIAGNOSIS — R471 Dysarthria and anarthria: Secondary | ICD-10-CM

## 2020-05-11 NOTE — Therapy (Signed)
Jackson Terrell [address] [place], [place], [address] Phone: [phone]   Fax:  [phone]  Speech Language Pathology Treatment/ Discharge Summary  Patient Details  Name: Jackson Terrell MRN: [id number] Date of Birth: 1990/07/25 Referring Provider (SLP): Jackson Rider NP   Encounter Date: 05/11/2020   End of Session - 05/11/20 1350    Visit Number 8    Number of Visits 13    Date for SLP Re-Evaluation 05/25/20    SLP Start Time 1320    SLP Stop Time  1349    SLP Time Calculation (min) 29 min    Activity Tolerance Treatment limited secondary to medical complications (Comment)           Past Medical History:  Diagnosis Date   Scoliosis    mild    Past Surgical History:  Procedure Laterality Date   BUBBLE STUDY  03/18/2020   Procedure: BUBBLE STUDY;  Surgeon: Jackson Hector, MD;  Location: [place];  Service: Cardiovascular;;   TEE WITHOUT CARDIOVERSION N/A 03/18/2020   Procedure: TRANSESOPHAGEAL ECHOCARDIOGRAM (TEE);  Surgeon: Jackson Hector, MD;  Location: [facility] ENDOSCOPY;  Service: Cardiovascular;  Laterality: N/A;    SPEECH THERAPY DISCHARGE SUMMARY  Visits from Start of Care: 8  Current functional level related to goals / functional outcomes: See below. Pt made excellent gains in speech intelligibility.   Remaining deficits: Mild dysarthria with heightened emotion.   Education / Equipment: Compensatory measures for dysarthria.   Plan: Patient agrees to discharge.  Patient goals were partially met. Patient is being discharged due to being pleased with the current functional level.  ?????       There were no vitals filed for this visit.   Subjective Assessment - 05/11/20 1326    Subjective "I still talk too quick sometimes but if I slow it down it goes well."    Currently in Pain? No/denies                 ADULT SLP TREATMENT - 05/11/20 1327      General Information    Behavior/Cognition Cooperative;Alert;Pleasant mood      Treatment Provided   Treatment provided Cognitive-Linquistic      Cognitive-Linquistic Treatment   Treatment focused on Dysarthria;Patient/family/caregiver education    Skilled Treatment Jackson Terrell reports having success talking to his mother onthe phone since last session and she had to ask him to repeat once "because I was speaking quickly." Pt indicated to SLP that this was a very big improvement over two weeks ago. He reports vendors, friends, and co-workers continue to have success in understanding pt. In conversation (mod complex) outdoors, Jackson Terrell maintained WNL articulation with supervision cues and self corrected errors with mod IRuthann Terrell remained 100% intelligible walking in this noisy environment, independently. Pt told SLP he was pleased with his progress he made in [place] Term Goals - 05/11/20 1331      SLP LONG TERM GOAL #1   Title Pt will be independent with HEP for dysarthira    Status Achieved      SLP LONG TERM GOAL #2   Title Pt will be 100% intelligble in noisy environment and while walking outside of clinic with rare min A over 2 sessions    Baseline 05-09-20, 05-11-20    Status Achieved      SLP LONG TERM GOAL #3   Title Pt will  use compensatory strategies to be 100% intelligible on phone calls with family and business with rare min A    Status Achieved      SLP LONG TERM GOAL #4   Title Pt wil limprove score on Communicative Effectiveness Survey by 3 points (original score is 19)    Time 3    Period Weeks    Status Partially Met             Patient will benefit from skilled therapeutic intervention in order to improve the following deficits and impairments:   Dysarthria and anarthria    Problem List Patient Active Problem List   Diagnosis Date Noted   Cerebral embolism with cerebral infarction 03/17/2020   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

## 2020-05-11 NOTE — Patient Instructions (Addendum)
  Coordination Activities  Perform the following activities for 5 minutes 3 times per day with right hand(s).   Rotate 2 golf  balls in fingertips (clockwise and counter-clockwise).  Twirl pen between fingers.  Rotate a card in your hand  Practice writing or typing daily(can use right handed typing words)  Practice picking up and manipulating small objects in your right hand.

## 2020-05-11 NOTE — Therapy (Signed)
[facility] [address] [place], [place], [address] Phone: [phone]   Fax:  [phone]  Occupational Therapy Treatment  Patient Details  Name: Jackson Terrell MRN: [id number] Date of Birth: 10/19/1990 Referring Provider (OT): Frann Rider, NP ((pt was referred by hospitalist))   Encounter Date: 05/11/2020   OT End of Session - 05/11/20 1255    Visit Number 12    Number of Visits 17    Date for OT Re-Evaluation 05/26/20    Authorization Type BCBS    Authorization Time Period Week 6/8 (05/09/20)    OT Start Time 1235    OT Stop Time 1313    OT Time Calculation (min) 38 min    Activity Tolerance Patient tolerated treatment well    Behavior During Therapy [facility] for tasks assessed/performed           Past Medical History:  Diagnosis Date  . Scoliosis    mild    Past Surgical History:  Procedure Laterality Date  . BUBBLE STUDY  03/18/2020   Procedure: BUBBLE STUDY;  Surgeon: Josue Hector, MD;  Location: [place];  Service: Cardiovascular;;  . TEE WITHOUT CARDIOVERSION N/A 03/18/2020   Procedure: TRANSESOPHAGEAL ECHOCARDIOGRAM (TEE);  Surgeon: Josue Hector, MD;  Location: [facility] ENDOSCOPY;  Service: Cardiovascular;  Laterality: N/A;    There were no vitals filed for this visit.   Subjective Assessment - 05/11/20 1239    Subjective  Pt reports work is going    Limitations Check Blood Pressure    Patient Stated Goals improve RUE dexterity so he get back to working from home    Currently in Pain? No/denies                  Treatment: Educated pt in updated HEP for coordination. Pt returned demonstration Right handed typing words issued, pt returned demonstration. Typing activities for increased speed and dexterity, good performance, writing activity using larger grip on pen with 100% legibility. Placing grooved pegs into pegboard then removing with right hand and tweezers, min difficulty/  v.c       BP=120/91       OT Education - 05/11/20 1247    Education Details Updated HEP for RUE coordination, right handed typing words    Person(s) Educated Patient    Methods Explanation;Demonstration;Verbal cues;Handout    Comprehension Verbalized understanding;Returned demonstration;Verbal cues required            OT Short Term Goals - 05/06/20 1540      OT SHORT TERM GOAL #1   Title I with HEP-04/29/20    Time 4    Period Weeks    Status Achieved    Target Date 04/29/20      OT SHORT TERM GOAL #2   Title Pt will type 30 wpm with at least 85% accuracy.    Time 4    Period Weeks    Status Achieved      OT SHORT TERM GOAL #3   Title Pt will verbalize understanding of adapted strategies for ADLS/IADLS to maximize safety and I prn.    Time 4    Period Weeks    Status Achieved      OT SHORT TERM GOAL #4   Title Pt will demonstrate improved RUE fine motor coordination for ADLs as evidenced by decreasing 9 hole peg test score by 4 secs.    Baseline RUE 43.84 secs. LUE 24.03 secs    Time 4  Period Weeks    Status Achieved      OT SHORT TERM GOAL #5   Title Pt will  demonstrate improved UE functional use for ADLs as evidenced by increasing box/ blocks score by 4 blocks with RUE    Baseline RUE 40, LUE 55    Time 4    Status Achieved             OT Long Term Goals - 05/11/20 1247      OT LONG TERM GOAL #1   Title I with updated HEP.-05/27/20    Time 8    Period Weeks    Status Achieved      OT LONG TERM GOAL #2   Title Pt will write a short paragraph with 100% legibility and only min decrease in letter size.    Time 8    Period Weeks    Status Achieved      OT LONG TERM GOAL #3   Title Pt will demonstrate improved fine motor coordination for ADLs as evidenced by decreasing 9 hole peg test score for RUE by 8 secs from eval    Baseline RUE 43.84 secs. LUE 24.03 secs    Time 8    Period Weeks    Status Achieved   RUE 30 seconds     OT LONG  TERM GOAL #4   Title Pt will  demonstrate improved RUE functional use for ADLs as evidenced by increasing box/ blocks score by 8 blocks    Baseline RUE 40 blocks, LUE 55    Time 8    Period Weeks    Status Achieved      OT LONG TERM GOAL #5   Title Pt will type 35 wpm with 90% or better accuracy in prep for return to work.    Time 8    Period Weeks    Status Achieved   40 wpm 95%     OT LONG TERM GOAL #6   Title Pt will resume prior level of home management / cooking at a modified I level demonstrating good safety awareness.    Time 8    Period Weeks    Status Achieved   pt reports met                Plan - 05/11/20 1245    Clinical Impression Statement Pt demonstrates excellent overall progress towards goals. He demonstrates  understanding of updated coordination HEP.    OT Occupational Profile and History Detailed Assessment- Review of Records and additional review of physical, cognitive, psychosocial history related to current functional performance    Occupational performance deficits (Please refer to evaluation for details): ADL's;IADL's;Work;Social Participation;Leisure    Body Structure / Function / Physical Skills ADL;UE functional use;Balance;Flexibility;Pain;FMC;ROM;Gait;GMC;Coordination;Decreased knowledge of precautions;Decreased knowledge of use of DME;IADL;Dexterity;Mobility;Tone;Strength    Rehab Potential Good    Clinical Decision Making Limited treatment options, no task modification necessary    Comorbidities Affecting Occupational Performance: May have comorbidities impacting occupational performance    Modification or Assistance to Complete Evaluation  No modification of tasks or assist necessary to complete eval    OT Frequency 2x / week   or 16 visits total plus eval   OT Duration 8 weeks    OT Treatment/Interventions Self-care/ADL training;Ultrasound;DME and/or AE instruction;Patient/family education;Paraffin;Passive range of motion;Balance  training;Cryotherapy;Fluidtherapy;Splinting;Moist Heat;Therapeutic exercise;Manual Therapy;Therapeutic activities;Neuromuscular education    Plan d/c OT    Consulted and Agree with Plan of Care Patient  Patient will benefit from skilled therapeutic intervention in order to improve the following deficits and impairments:   Body Structure / Function / Physical Skills: ADL, UE functional use, Balance, Flexibility, Pain, FMC, ROM, Gait, GMC, Coordination, Decreased knowledge of precautions, Decreased knowledge of use of DME, IADL, Dexterity, Mobility, Tone, Strength     OCCUPATIONAL THERAPY DISCHARGE SUMMARY    Current functional level related to goals / functional outcomes: Pt achieved all goals.   Remaining deficits: Mild coordination deficits in RUE   Education / Equipment:Pt was instructed in HEP, he demonstrates  understanding. Plan: Patient agrees to discharge.  Patient goals were met. Patient is being discharged due to meeting the stated rehab goals.  ?????       Visit Diagnosis: Muscle weakness (generalized)  Other lack of coordination    Problem List Patient Active Problem List   Diagnosis Date Noted  . 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 Depression 03/15/2020    Xylina Rhoads 05/11/2020, 12:56 PM Theone Murdoch, OTR/L [phone] Phone: [phone] 1:01 PM 05/11/20 [facility] [address] [place], [place], [address] Phone: [phone]   Fax:  [phone]  Name: Jackson Terrell MRN: [id number] Date of Birth: Mar 09, 1991

## 2020-05-16 ENCOUNTER — Encounter: Payer: BC Managed Care – PPO | Admitting: Speech Pathology

## 2020-05-18 ENCOUNTER — Encounter: Payer: BC Managed Care – PPO | Admitting: Speech Pathology

## 2020-05-23 ENCOUNTER — Encounter: Payer: BC Managed Care – PPO | Admitting: Speech Pathology

## 2020-05-25 ENCOUNTER — Encounter: Payer: BC Managed Care – PPO | Admitting: Speech Pathology

## 2020-05-30 ENCOUNTER — Encounter: Payer: BC Managed Care – PPO | Admitting: Speech Pathology

## 2020-06-01 ENCOUNTER — Encounter: Payer: BC Managed Care – PPO | Admitting: Speech Pathology

## 2020-06-06 ENCOUNTER — Encounter: Payer: BC Managed Care – PPO | Admitting: Speech Pathology

## 2020-08-17 ENCOUNTER — Ambulatory Visit: Payer: BC Managed Care – PPO | Admitting: Adult Health

## 2020-08-17 ENCOUNTER — Ambulatory Visit (INDEPENDENT_AMBULATORY_CARE_PROVIDER_SITE_OTHER): Payer: BC Managed Care – PPO | Admitting: Adult Health

## 2020-08-17 ENCOUNTER — Encounter: Payer: Self-pay | Admitting: Adult Health

## 2020-08-17 VITALS — BP 135/79 | HR 98 | Ht 67.0 in | Wt 229.0 lb

## 2020-08-17 DIAGNOSIS — I639 Cerebral infarction, unspecified: Secondary | ICD-10-CM

## 2020-08-17 NOTE — Progress Notes (Signed)
[facility] [address] [place]. [place] [address] [phone]       STROKE FOLLOW UP NOTE  Jackson Terrell Date of Birth:  [birth year] Medical Record Number:  [id number]   Reason for Referral: stroke follow up    SUBJECTIVE:   CHIEF COMPLAINT:  Chief Complaint  Patient presents with  . Follow-up    RM 14 Alone  Pt states things have been great     HPI:   Today, 08/17/2020, Jackson Terrell returns for stroke follow-up unaccompanied.  Stable from stroke standpoint without new stroke/TIA symptoms and reports residual slightly decreased right hand dexterity and occasional slight dysarthria with continued improvement.  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  He has remained on aspirin 81 mg daily without bleeding or bruising.  He has remained on atorvastatin 80 mg daily without myalgias.  Lipid panel 04/27/20 showed LDL 48.  Blood pressure today 135/79.  No concerns at this time.   History provided for reference purposes only Initial visit 04/20/2020 JM: Mr. [name] is being seen for hospital follow up accompanied by his mother.  Residual deficits of decreased right hand dexterity and dysarthria but does report continued improvement.  Denies residual balance, dizziness or visual issues.  Currently using a cane intermittently as he has some gait difficulty with increased fatigue or with increased exertion but denies any recent falls.  Currently working with outpatient PT/OT/SLP.  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  He requests clearance to drive independently as his mother has been accompanying him while he is driving.  Completed 3 weeks DAPT and check aspirin for additional 1 week and then discontinued as he was not aware this is an ongoing medication.  Denies any issues with bleeding or bruising.  Completed 30 days of atorvastatin and discontinued, again, he was unaware of ongoing  use.  Denies any myalgias or statin side effects.  Blood pressure today 146/98.  He does not routinely monitor at home.  No further concerns.  Stroke admission 03/15/2020 Jackson Terrell is a 30 y.o. male withPMH significant for scoliosis, and depression on sertralinewho presented on 03/15/2020 with diaphoresis, nausea/vomiting. He was noted to have dilated pupils, hyperreflexia and increased tone in the ED. Given his presentation, neurology was consulted to assess for potential serotonin syndrome. MRI showed right cerebellar infarct embolic, secondary to unknown source.  Possible serotonin syndrome from SSRI overmedication taking sertraline 300 mg daily.  TEE unremarkable without evidence of PFO or thrombus.  Hypercoagulable work-up unremarkable.  HTN stable.  LDL 96 and initiate atorvastatin 10 mg daily.  Controlled DM with A1c 5.4.  Other stroke risk factors include EtOH use but no prior stroke history.  Recommended DAPT for 3 weeks then aspirin alone.  Advised to follow-up outpatient with psychiatry for depression management.  Residual deficits of mild saccadic dysmetria on right lateral gaze, dysarthria and right-sided weakness and discharged home in stable condition recommendation of outpatient therapy.  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 Likely an acute infarct however cannot exclude demyelination. Consider postcontrast imaging for further evaluation.  MRA head /neck no abnormal enhancement. This is suggestive of acute infarct involving the superior right cerebellum and vermis;    2D Echo  EF 60 to 65%  Transcranial Doppler: Negative for PFO  TEE: Normal   hypercoagulable studies: neg  LDL 96  HgbA1c 5.4  VTE prophylaxis - none  No antithrombotic prior to admission, now on aspirin 325 mg daily.   Therapy recommendations:  OP PT/OT/SLP  Disposition:  home     ROS:    14 system review of systems performed and negative with exception of those listed in HPI  PMH:  Past Medical History:  Diagnosis Date  . Scoliosis    mild    PSH:  Past Surgical History:  Procedure Laterality Date  . BUBBLE STUDY  03/18/2020   Procedure: BUBBLE STUDY;  Surgeon: Wendall Stade, MD;  Location[facility] ENDOSCOPY;  Service: Cardiovascular;;  . TEE WITHOUT CARDIOVERSION N/A 03/18/2020   Procedure: TRANSESOPHAGEAL ECHOCARDIOGRAM (TEE);  Surgeon: Wendall Stade, MD;  Location: [facility] ENDOSCOPY;  Service: Cardiovascular;  Laterality: N/A;    Social History:  Social History   Socioeconomic History  . Marital status: Single    Spouse name: Not on file  . Number of children: Not on file  . Years of education: Not on file  . Highest education level: Not on file  Occupational History  . Occupation: accounts payable (Therapist, sports)    Employer: [employer]  Tobacco Use  . Smoking status: Former Smoker    Types: Cigarettes    Quit date: 07/17/2007    Years since quitting: 13.0  . Smokeless tobacco: Never Used  Substance and Sexual Activity  . Alcohol use: Yes    Comment: 3 beers/week or less  . Drug use: No  . Sexual activity: Not on file  Other Topics Concern  . Not on file  Social History Narrative   Lives alone, no pets   Social Determinants of Health   Financial Resource Strain: Not on file  Food Insecurity: Not on file  Transportation Needs: No Transportation Needs  . Lack of Transportation (Medical): No  . Lack of Transportation (Non-Medical): No  Physical Activity: Not on file  Stress: Not on file  Social Connections: Not on file  Intimate Partner Violence: Not on file    Family History:  Family History  Problem Relation Age of Onset  . Hemachromatosis Father   . Cancer Neg Hx   . Diabetes Neg Hx     Medications:   Current Outpatient Medications on File Prior to Visit  Medication Sig Dispense Refill  . aspirin EC 81 MG tablet Take 1  tablet (81 mg total) by mouth daily. Swallow whole. 90 tablet 3  . atorvastatin (LIPITOR) 80 MG tablet Take 1 tablet (80 mg total) by mouth daily. 90 tablet 3  . lisinopril (ZESTRIL) 10 MG tablet Take 20 mg by mouth daily.    . sertraline (ZOLOFT) 100 MG tablet Take 1 tablet (100 mg total) by mouth daily. 30 tablet 0   No current facility-administered medications on file prior to visit.    Allergies:  No Known Allergies    OBJECTIVE:  Physical Exam  Vitals:   08/17/20 1409  BP: 135/79  Pulse: 98  Weight: 229 lb (103.9 kg)  Height: 5\' 7"  (1.702 m)   Body mass index is 35.87 kg/m. No exam data present  General: well developed, well nourished, pleasant middle-age Caucasian male, seated, in no evident distress Head: head normocephalic and atraumatic.   Neck: supple with no carotid or supraclavicular bruits Cardiovascular: regular rate and rhythm, no murmurs Musculoskeletal: no deformity Skin:  no rash/petichiae Vascular:  Normal pulses all extremities   Neurologic Exam Mental Status: Awake and fully alert.   Fluent speech and  language.  Oriented to place and time. Recent and remote memory intact. Attention span, concentration and fund of knowledge appropriate. Mood and affect appropriate.  Cranial Nerves: Pupils equal, briskly reactive to light. Extraocular movements full without nystagmus. Visual fields full to confrontation. Hearing intact. Facial sensation intact.  Face, tongue and palate moves normally and symmetrically.  Motor: Normal bulk and tone. Normal strength in all tested extremity muscles except slightly decreased right hand dexterity Sensory.: intact to touch , pinprick , position and vibratory sensation.  Coordination: Rapid alternating movements normal in all extremities except slightly decreased right hand. Finger-to-nose RUE ataxia and heel-to-shin performed accurately Gait and Station: Arises from chair without difficulty. Stance is normal. Gait demonstrates  normal stride length and balance without use of assistive device.  Able to tandem walk and heel and toe walk without difficulty.  Romberg negative. Reflexes: 1+ and symmetric. Toes downgoing.         ASSESSMENT: JW COVIN is a 30 y.o. year old male presented with diaphoresis and nausea/vomiting on 03/15/2020 found to have dilated pupils, hyperreflexia and increased tone in ED.  Consulted neurology with concern of serotonin syndrome secondary to increased dose of sertraline 100 mg daily.  MRI showed evidence of right cerebellum and vermis infarct, embolic secondary source with negative TEE and hypercoagulable work-up.  No history of prolonged travel, history of DVT/PE, strokes or heart attacks and family at a young age.  Vascular risk factors include HLD.      PLAN:  1. R cerebellum and vermis stroke:  a. Residual deficit: Slightly decreased right hand dexterity and occasional slight dysarthria b. Continue aspirin 81 mg daily  and atorvastatin 80 mg daily for secondary stroke prevention.  Discussed lifelong use of these medications for secondary stroke prevention. c. Discussed secondary stroke prevention measures and importance of close PCP follow up for aggressive stroke risk factor management  2. HTN: BP goal <130/90.  Stable on lisinopril 20 mg daily per PCP 3. HLD: LDL goal <70.  Prior LDL 49 (04/2020).  On atorvastatin 80 mg daily    Overall stable from stroke standpoint and routinely follows with PCP for secondary stroke prevention measures therefore recommend follow-up on an as-needed basis   CC:  [facility] provider: Dr. Casimer Bilis, Onalee Hua, MD    I spent 30 minutes of face-to-face and non-face-to-face time with patient.  This included previsit chart review, lab review, study review, order entry, electronic health record documentation, patient education regarding recent stroke, residual deficits, importance of managing stroke risk factors and answered all other questions to  patient satisfaction   Ihor Austin, [facility]  [facility] [address] [place], [place] [address]  Phone [phone] Fax [phone] Note: This document was prepared with digital dictation and possible smart phrase technology. Any transcriptional errors that result from this process are unintentional.

## 2020-08-17 NOTE — Patient Instructions (Signed)
Continue aspirin 81 mg daily  and atorvastatin 80 mg daily for secondary stroke prevention  Continue to follow up with PCP regarding cholesterol and blood pressure management  Maintain strict control of hypertension with blood pressure goal below 130/90 and cholesterol with LDL cholesterol (bad cholesterol) goal below 70 mg/dL.    Overall stable from stroke standpoint and recommend follow-up on an as-needed basis    Thank you for coming to see [place] at [facility]. I hope we have been able to provide you high quality care today.  You may receive a patient satisfaction survey over the next few weeks. We would appreciate your feedback and comments so that we may continue to improve ourselves and the health of our patients.

## 2021-04-21 ENCOUNTER — Other Ambulatory Visit: Payer: Self-pay | Admitting: Adult Health

## 2021-09-28 ENCOUNTER — Other Ambulatory Visit: Payer: Self-pay | Admitting: Adult Health
